# Patient Record
Sex: Female | Born: 1945
Health system: Southern US, Community
[De-identification: ages and names within clinical notes are randomized; demographics above are authoritative.]

## PROBLEM LIST (undated history)

## (undated) DIAGNOSIS — I10 Essential (primary) hypertension: Secondary | ICD-10-CM

## (undated) DIAGNOSIS — M1990 Unspecified osteoarthritis, unspecified site: Secondary | ICD-10-CM

## (undated) DIAGNOSIS — C801 Malignant (primary) neoplasm, unspecified: Secondary | ICD-10-CM

## (undated) DIAGNOSIS — N362 Urethral caruncle: Secondary | ICD-10-CM

## (undated) DIAGNOSIS — R7303 Prediabetes: Secondary | ICD-10-CM

## (undated) DIAGNOSIS — Z853 Personal history of malignant neoplasm of breast: Secondary | ICD-10-CM

## (undated) HISTORY — DX: Malignant (primary) neoplasm, unspecified: C80.1

## (undated) HISTORY — DX: Essential (primary) hypertension: I10

## (undated) HISTORY — PX: JOINT REPLACEMENT: SHX530

## (undated) HISTORY — PX: BREAST SURGERY: SHX581

---

## 1996-12-09 HISTORY — PX: VAGINAL HYSTERECTOMY: SUR661

## 1998-06-22 ENCOUNTER — Ambulatory Visit (HOSPITAL_COMMUNITY): Admission: RE | Admit: 1998-06-22 | Discharge: 1998-06-22 | Payer: Self-pay | Admitting: Obstetrics and Gynecology

## 2000-11-28 ENCOUNTER — Other Ambulatory Visit: Admission: RE | Admit: 2000-11-28 | Discharge: 2000-11-28 | Payer: Self-pay | Admitting: Obstetrics and Gynecology

## 2002-03-10 ENCOUNTER — Other Ambulatory Visit: Admission: RE | Admit: 2002-03-10 | Discharge: 2002-03-10 | Payer: Self-pay | Admitting: Obstetrics and Gynecology

## 2002-07-28 ENCOUNTER — Ambulatory Visit (HOSPITAL_COMMUNITY): Admission: RE | Admit: 2002-07-28 | Discharge: 2002-07-28 | Payer: Self-pay | Admitting: Obstetrics and Gynecology

## 2002-07-28 ENCOUNTER — Encounter: Payer: Self-pay | Admitting: Obstetrics and Gynecology

## 2003-10-11 ENCOUNTER — Ambulatory Visit (HOSPITAL_COMMUNITY): Admission: RE | Admit: 2003-10-11 | Discharge: 2003-10-11 | Payer: Self-pay | Admitting: Obstetrics and Gynecology

## 2003-12-21 ENCOUNTER — Emergency Department (HOSPITAL_COMMUNITY): Admission: EM | Admit: 2003-12-21 | Discharge: 2003-12-21 | Payer: Self-pay | Admitting: Emergency Medicine

## 2004-10-18 ENCOUNTER — Ambulatory Visit (HOSPITAL_COMMUNITY): Admission: RE | Admit: 2004-10-18 | Discharge: 2004-10-18 | Payer: Self-pay | Admitting: Obstetrics and Gynecology

## 2004-12-04 ENCOUNTER — Emergency Department (HOSPITAL_COMMUNITY): Admission: EM | Admit: 2004-12-04 | Discharge: 2004-12-04 | Payer: Self-pay | Admitting: Emergency Medicine

## 2005-08-27 ENCOUNTER — Ambulatory Visit: Payer: Self-pay | Admitting: Internal Medicine

## 2005-09-29 ENCOUNTER — Emergency Department (HOSPITAL_COMMUNITY): Admission: EM | Admit: 2005-09-29 | Discharge: 2005-09-29 | Payer: Self-pay | Admitting: Emergency Medicine

## 2006-08-15 ENCOUNTER — Ambulatory Visit (HOSPITAL_COMMUNITY): Admission: RE | Admit: 2006-08-15 | Discharge: 2006-08-15 | Payer: Self-pay | Admitting: Obstetrics and Gynecology

## 2006-12-09 HISTORY — PX: OTHER SURGICAL HISTORY: SHX169

## 2007-06-17 DIAGNOSIS — I10 Essential (primary) hypertension: Secondary | ICD-10-CM | POA: Insufficient documentation

## 2007-06-30 ENCOUNTER — Ambulatory Visit: Payer: Self-pay | Admitting: Internal Medicine

## 2007-07-09 ENCOUNTER — Ambulatory Visit: Payer: Self-pay | Admitting: Internal Medicine

## 2007-07-09 ENCOUNTER — Encounter: Payer: Self-pay | Admitting: Internal Medicine

## 2007-07-09 LAB — CONVERTED CEMR LAB
ALT: 14 units/L (ref 0–35)
AST: 17 units/L (ref 0–37)
Albumin: 3.9 g/dL (ref 3.5–5.2)
Alkaline Phosphatase: 74 units/L (ref 39–117)
BUN: 12 mg/dL (ref 6–23)
Basophils Absolute: 0.1 10*3/uL (ref 0.0–0.1)
Basophils Relative: 1.2 % — ABNORMAL HIGH (ref 0.0–1.0)
Bilirubin, Direct: 0.1 mg/dL (ref 0.0–0.3)
CO2: 32 meq/L (ref 19–32)
Calcium: 9.3 mg/dL (ref 8.4–10.5)
Chloride: 101 meq/L (ref 96–112)
Cholesterol: 186 mg/dL (ref 0–200)
Creatinine, Ser: 0.7 mg/dL (ref 0.4–1.2)
Eosinophils Absolute: 0.3 10*3/uL (ref 0.0–0.6)
Eosinophils Relative: 5.4 % — ABNORMAL HIGH (ref 0.0–5.0)
GFR calc Af Amer: 110 mL/min
GFR calc non Af Amer: 91 mL/min
Glucose, Bld: 118 mg/dL — ABNORMAL HIGH (ref 70–99)
HCT: 35.2 % — ABNORMAL LOW (ref 36.0–46.0)
HDL: 57.2 mg/dL (ref >39.0)
Hemoglobin: 12 g/dL (ref 12.0–15.0)
LDL Cholesterol: 119 mg/dL — ABNORMAL HIGH (ref 0–99)
Lymphocytes Relative: 39 % (ref 12.0–46.0)
MCHC: 34 g/dL (ref 30.0–36.0)
MCV: 81.8 fL (ref 78.0–100.0)
Monocytes Absolute: 0.4 10*3/uL (ref 0.2–0.7)
Monocytes Relative: 6.7 % (ref 3.0–11.0)
Neutro Abs: 2.9 10*3/uL (ref 1.4–7.7)
Neutrophils Relative %: 47.7 % (ref 43.0–77.0)
Phosphorus: 4.3 mg/dL (ref 2.3–4.6)
Platelets: 322 10*3/uL (ref 150–400)
Potassium: 3.8 meq/L (ref 3.5–5.1)
RBC: 4.31 M/uL (ref 3.87–5.11)
RDW: 13.4 % (ref 11.5–14.6)
Sodium: 138 meq/L (ref 135–145)
TSH: 1.39 microintl units/mL (ref 0.35–5.50)
Total Bilirubin: 0.6 mg/dL (ref 0.3–1.2)
Total CHOL/HDL Ratio: 3.3
Total Protein: 7.2 g/dL (ref 6.0–8.3)
Triglycerides: 48 mg/dL (ref 0–149)
VLDL: 10 mg/dL (ref 0–40)
WBC: 6.1 10*3/uL (ref 4.5–10.5)

## 2007-09-22 ENCOUNTER — Encounter: Payer: Self-pay | Admitting: Internal Medicine

## 2007-12-10 DIAGNOSIS — D0590 Unspecified type of carcinoma in situ of unspecified breast: Secondary | ICD-10-CM | POA: Insufficient documentation

## 2007-12-10 HISTORY — PX: MASTECTOMY: SHX3

## 2007-12-24 ENCOUNTER — Ambulatory Visit (HOSPITAL_COMMUNITY): Admission: RE | Admit: 2007-12-24 | Discharge: 2007-12-24 | Payer: Self-pay | Admitting: Obstetrics and Gynecology

## 2008-01-06 ENCOUNTER — Encounter: Admission: RE | Admit: 2008-01-06 | Discharge: 2008-01-06 | Payer: Self-pay | Admitting: Obstetrics and Gynecology

## 2008-01-06 ENCOUNTER — Encounter (INDEPENDENT_AMBULATORY_CARE_PROVIDER_SITE_OTHER): Payer: Self-pay | Admitting: Diagnostic Radiology

## 2008-01-21 ENCOUNTER — Encounter: Admission: RE | Admit: 2008-01-21 | Discharge: 2008-01-21 | Payer: Self-pay | Admitting: Obstetrics and Gynecology

## 2008-03-01 ENCOUNTER — Encounter (INDEPENDENT_AMBULATORY_CARE_PROVIDER_SITE_OTHER): Payer: Self-pay | Admitting: General Surgery

## 2008-03-01 ENCOUNTER — Inpatient Hospital Stay (HOSPITAL_COMMUNITY): Admission: RE | Admit: 2008-03-01 | Discharge: 2008-03-03 | Payer: Self-pay | Admitting: General Surgery

## 2008-03-01 HISTORY — PX: OTHER SURGICAL HISTORY: SHX169

## 2008-03-08 ENCOUNTER — Ambulatory Visit: Payer: Self-pay | Admitting: Oncology

## 2008-03-18 ENCOUNTER — Encounter: Payer: Self-pay | Admitting: Internal Medicine

## 2008-03-18 LAB — CBC WITH DIFFERENTIAL/PLATELET
BASO%: 0.3 % (ref 0.0–2.0)
EOS%: 3.7 % (ref 0.0–7.0)
LYMPH%: 38.2 % (ref 14.0–48.0)
MCH: 27 pg (ref 26.0–34.0)
MCHC: 33.5 g/dL (ref 32.0–36.0)
MCV: 80.7 fL — ABNORMAL LOW (ref 81.0–101.0)
MONO%: 5.6 % (ref 0.0–13.0)
Platelets: 390 10*3/uL (ref 145–400)
RBC: 4.33 10*6/uL (ref 3.70–5.32)
RDW: 14.2 % (ref 11.3–14.5)

## 2008-03-21 LAB — COMPREHENSIVE METABOLIC PANEL
ALT: 10 U/L (ref 0–35)
AST: 10 U/L (ref 0–37)
Alkaline Phosphatase: 90 U/L (ref 39–117)
Glucose, Bld: 129 mg/dL — ABNORMAL HIGH (ref 70–99)
Potassium: 3.7 mEq/L (ref 3.5–5.3)
Sodium: 143 mEq/L (ref 135–145)
Total Bilirubin: 0.3 mg/dL (ref 0.3–1.2)
Total Protein: 7.6 g/dL (ref 6.0–8.3)

## 2008-03-25 LAB — VITAMIN D 1,25 DIHYDROXY: Vit D, 1,25-Dihydroxy: 26 pg/mL (ref 15–75)

## 2008-04-04 ENCOUNTER — Encounter: Payer: Self-pay | Admitting: Internal Medicine

## 2008-05-26 ENCOUNTER — Ambulatory Visit: Payer: Self-pay | Admitting: Oncology

## 2008-05-30 ENCOUNTER — Encounter: Payer: Self-pay | Admitting: Internal Medicine

## 2008-05-30 LAB — CBC WITH DIFFERENTIAL/PLATELET
Basophils Absolute: 0 10*3/uL (ref 0.0–0.1)
Eosinophils Absolute: 0.3 10*3/uL (ref 0.0–0.5)
HCT: 35.7 % (ref 34.8–46.6)
HGB: 11.9 g/dL (ref 11.6–15.9)
LYMPH%: 39.1 % (ref 14.0–48.0)
MONO#: 0.4 10*3/uL (ref 0.1–0.9)
NEUT#: 3 10*3/uL (ref 1.5–6.5)
Platelets: 333 10*3/uL (ref 145–400)
RBC: 4.45 10*6/uL (ref 3.70–5.32)
WBC: 6 10*3/uL (ref 3.9–10.0)

## 2008-05-31 LAB — FERRITIN: Ferritin: 90 ng/mL (ref 10–291)

## 2008-07-01 ENCOUNTER — Ambulatory Visit: Payer: Self-pay | Admitting: Internal Medicine

## 2008-07-01 DIAGNOSIS — R209 Unspecified disturbances of skin sensation: Secondary | ICD-10-CM | POA: Insufficient documentation

## 2008-09-15 ENCOUNTER — Ambulatory Visit: Payer: Self-pay | Admitting: Internal Medicine

## 2008-10-13 ENCOUNTER — Ambulatory Visit: Payer: Self-pay | Admitting: Oncology

## 2008-10-17 ENCOUNTER — Encounter: Payer: Self-pay | Admitting: Internal Medicine

## 2008-10-17 LAB — CBC WITH DIFFERENTIAL/PLATELET
Basophils Absolute: 0 10*3/uL (ref 0.0–0.1)
EOS%: 3.1 % (ref 0.0–7.0)
Eosinophils Absolute: 0.2 10*3/uL (ref 0.0–0.5)
HGB: 11.4 g/dL — ABNORMAL LOW (ref 11.6–15.9)
LYMPH%: 37.2 % (ref 14.0–48.0)
MCH: 27.3 pg (ref 26.0–34.0)
MCV: 82.5 fL (ref 81.0–101.0)
MONO%: 5.1 % (ref 0.0–13.0)
NEUT#: 3.5 10*3/uL (ref 1.5–6.5)
NEUT%: 54.1 % (ref 39.6–76.8)
Platelets: 313 10*3/uL (ref 145–400)

## 2008-10-17 LAB — COMPREHENSIVE METABOLIC PANEL
BUN: 11 mg/dL (ref 6–23)
CO2: 29 mEq/L (ref 19–32)
Creatinine, Ser: 0.54 mg/dL (ref 0.40–1.20)
Glucose, Bld: 110 mg/dL — ABNORMAL HIGH (ref 70–99)
Total Bilirubin: 0.4 mg/dL (ref 0.3–1.2)
Total Protein: 6.5 g/dL (ref 6.0–8.3)

## 2008-12-07 ENCOUNTER — Encounter: Payer: Self-pay | Admitting: Internal Medicine

## 2009-01-10 ENCOUNTER — Encounter: Admission: RE | Admit: 2009-01-10 | Discharge: 2009-01-10 | Payer: Self-pay | Admitting: Oncology

## 2009-02-22 ENCOUNTER — Ambulatory Visit: Payer: Self-pay | Admitting: Oncology

## 2009-02-24 ENCOUNTER — Encounter: Payer: Self-pay | Admitting: Internal Medicine

## 2009-02-24 LAB — RESEARCH LABS

## 2009-08-23 ENCOUNTER — Ambulatory Visit: Payer: Self-pay | Admitting: Oncology

## 2009-10-10 ENCOUNTER — Ambulatory Visit: Payer: Self-pay | Admitting: Oncology

## 2009-10-12 ENCOUNTER — Encounter (INDEPENDENT_AMBULATORY_CARE_PROVIDER_SITE_OTHER): Payer: Self-pay | Admitting: *Deleted

## 2009-10-12 LAB — CBC WITH DIFFERENTIAL/PLATELET
BASO%: 0.9 % (ref 0.0–2.0)
EOS%: 2.4 % (ref 0.0–7.0)
HCT: 37.3 % (ref 34.8–46.6)
HGB: 11.7 g/dL (ref 11.6–15.9)
MCH: 26.8 pg (ref 25.1–34.0)
MCHC: 31.4 g/dL — ABNORMAL LOW (ref 31.5–36.0)
MONO#: 0.4 10*3/uL (ref 0.1–0.9)
RDW: 14 % (ref 11.2–14.5)
WBC: 5.9 10*3/uL (ref 3.9–10.3)
lymph#: 2.6 10*3/uL (ref 0.9–3.3)

## 2009-10-12 LAB — FERRITIN: Ferritin: 104 ng/mL (ref 10–291)

## 2010-01-23 ENCOUNTER — Encounter: Admission: RE | Admit: 2010-01-23 | Discharge: 2010-01-23 | Payer: Self-pay | Admitting: Oncology

## 2010-02-06 ENCOUNTER — Ambulatory Visit: Payer: Self-pay | Admitting: Oncology

## 2010-03-13 ENCOUNTER — Ambulatory Visit: Payer: Self-pay | Admitting: Oncology

## 2010-03-15 ENCOUNTER — Encounter: Payer: Self-pay | Admitting: Internal Medicine

## 2010-10-15 ENCOUNTER — Ambulatory Visit: Payer: Self-pay | Admitting: Internal Medicine

## 2010-12-18 ENCOUNTER — Ambulatory Visit: Payer: Self-pay | Admitting: Oncology

## 2010-12-30 ENCOUNTER — Encounter: Payer: Self-pay | Admitting: Oncology

## 2010-12-30 ENCOUNTER — Encounter: Payer: Self-pay | Admitting: Obstetrics and Gynecology

## 2011-01-08 ENCOUNTER — Other Ambulatory Visit (HOSPITAL_COMMUNITY): Payer: Self-pay | Admitting: Obstetrics and Gynecology

## 2011-01-08 DIAGNOSIS — Z139 Encounter for screening, unspecified: Secondary | ICD-10-CM

## 2011-01-08 DIAGNOSIS — Z1231 Encounter for screening mammogram for malignant neoplasm of breast: Secondary | ICD-10-CM

## 2011-01-10 NOTE — Letter (Signed)
[facility]   Imported By: Maryln Gottron 04/05/2010 10:05:58  _____________________________________________________________________  External Attachment:    Type:   Image     Comment:   External Document

## 2011-01-10 NOTE — Assessment & Plan Note (Signed)
Summary: consult re: leg and knee swelling/cjr   Vital Signs:  Patient profile:   65 year old female Weight:      150 pounds Temp:     98.6 degrees F oral BP sitting:   118 / 70  (right arm) Cuff size:   regular  Vitals Entered By: Duard Brady LPN (October 15, 2010 1:02 PM) CC: c/o (L) leg and knee pain    ***declined flu vaccine Is Patient Diabetic? No   CC:  c/o (L) leg and knee pain    ***declined flu vaccine.  History of Present Illness: 65 year old patient has a history of hypertension.  She is followed closely by oncology.  Status post left mastectomy for breast cancer.  For the past several months.  She has had some nonspecific discomfort and numbness involving her left leg.  This has been present for after 6 months.  Initially been nonprogressive.  She did see orthopedics and had negative x-rays.  Preventive Screening-Counseling & Management  Alcohol-Tobacco     Smoking Status: never  Allergies (verified): No Known Drug Allergies  Past History:  Past Medical History: Hypertension fibrocystic breast disease history of vitamin D deficiency breast cancer high-grade DCIS left breast, history of negative sentinel node biopsy April 2009      tamoxifen started 04-04-08  Past Surgical History: Hysterectomy patient also had bilateral salpingo-oophorectomy due to endometriosis Tubal ligation status post partial left mastectomy for high-grade DCIS with negative sentinel node biopsy colonoscopy 12-09   Social History: Former Smoker Widow/Widower-lost husband, Fayrene Fearing,  to cancer, December 2010 Smoking Status:  never  Review of Systems       The patient complains of difficulty walking.  The patient denies anorexia, fever, weight loss, weight gain, vision loss, decreased hearing, hoarseness, chest pain, syncope, dyspnea on exertion, peripheral edema, prolonged cough, headaches, hemoptysis, abdominal pain, melena, hematochezia, severe indigestion/heartburn, hematuria,  incontinence, genital sores, muscle weakness, suspicious skin lesions, depression, unusual weight change, abnormal bleeding, enlarged lymph nodes, angioedema, and breast masses.    Physical Exam  General:  Well-developed,well-nourished,in no acute distress; alert,appropriate and cooperative throughout examination Head:  Normocephalic and atraumatic without obvious abnormalities. No apparent alopecia or balding. Eyes:  No corneal or conjunctival inflammation noted. EOMI. Perrla. Funduscopic exam benign, without hemorrhages, exudates or papilledema. Vision grossly normal. Mouth:  Oral mucosa and oropharynx without lesions or exudates.  Teeth in good repair. Neck:  No deformities, masses, or tenderness noted. Chest Wall:  No deformities, masses, or tenderness noted. Lungs:  Normal respiratory effort, chest expands symmetrically. Lungs are clear to auscultation, no crackles or wheezes. Heart:  Normal rate and regular rhythm. S1 and S2 normal without gallop, murmur, click, rub or other extra sounds. Abdomen:  Bowel sounds positive,abdomen soft and non-tender without masses, organomegaly or hernias noted. Msk:  No deformity or scoliosis noted of thoracic or lumbar spine.   Pulses:  R and L carotid,radial,femoral,dorsalis pedis and posterior tibial pulses are full and equal bilaterally Extremities:  No clubbing, cyanosis, edema, or deformity noted with normal full range of motion of all joints.     Impression & Recommendations:  Problem # 1:  PARESTHESIA (ICD-782.0) clinical exam of the left leg is unremarkable.  Will try Advil and clinically observed.  She does work 12 hour days on her feet with [employer]  Problem # 2:  HYPERTENSION (ICD-401.9)  Her updated medication list for this problem includes:    Hydrochlorothiazide 25 Mg Tabs (Hydrochlorothiazide) .Marland Kitchen... 1 once daily  Her updated medication list for this problem includes:    Hydrochlorothiazide 25 Mg Tabs (Hydrochlorothiazide) .Marland Kitchen... 1  once daily  Complete Medication List: 1)  Hydrochlorothiazide 25 Mg Tabs (Hydrochlorothiazide) .Marland Kitchen.. 1 once daily 2)  Tamoxifen Citrate 20 Mg Tabs (Tamoxifen citrate) .Marland Kitchen.. 1 once daily  Patient Instructions: 1)  Please schedule a follow-up appointment in 1 year. 2)  Limit your Sodium (Salt). 3)  It is important that you exercise regularly at least 20 minutes 5 times a week. If you develop chest pain, have severe difficulty breathing, or feel very tired , stop exercising immediately and seek medical attention. 4)  Take calcium +Vitamin D daily. 5)  Check your Blood Pressure regularly. If it is above: 150/90  you should make an appointment. Prescriptions: HYDROCHLOROTHIAZIDE 25 MG TABS (HYDROCHLOROTHIAZIDE) 1 once daily  #90 x 6   Entered and Authorized by:   Gordy Savers  MD   Signed by:   Gordy Savers  MD on 10/15/2010   Method used:   Electronically to        [facility] (retail)       [address]       [place], [place]  [address]       Ph: [phone]       Fax: [phone]   RxID:   [id number]    Orders Added: 1)  Est. Patient Level III [401[address]]

## 2011-01-25 ENCOUNTER — Other Ambulatory Visit: Payer: Self-pay | Admitting: Oncology

## 2011-01-25 ENCOUNTER — Ambulatory Visit
Admission: RE | Admit: 2011-01-25 | Discharge: 2011-01-25 | Disposition: A | Discharge status: Home / Self Care | Payer: BC Managed Care – PPO | Source: Ambulatory Visit | Attending: Oncology | Admitting: Oncology

## 2011-01-25 ENCOUNTER — Ambulatory Visit (HOSPITAL_COMMUNITY): Admission: RE | Admit: 2011-01-25 | Payer: BC Managed Care – PPO | Source: Ambulatory Visit

## 2011-01-25 DIAGNOSIS — Z139 Encounter for screening, unspecified: Secondary | ICD-10-CM

## 2011-02-04 ENCOUNTER — Encounter (HOSPITAL_BASED_OUTPATIENT_CLINIC_OR_DEPARTMENT_OTHER): Payer: BC Managed Care – PPO | Admitting: Oncology

## 2011-02-04 DIAGNOSIS — Z170 Estrogen receptor positive status [ER+]: Secondary | ICD-10-CM

## 2011-02-04 DIAGNOSIS — D0590 Unspecified type of carcinoma in situ of unspecified breast: Secondary | ICD-10-CM

## 2011-04-23 NOTE — Op Note (Signed)
NAMEEARLIE, ARCIGA NO.:  [id number]   MEDICAL RECORD NO.:  [id number]          PATIENT TYPE:  OIB   LOCATION:  5128                         FACILITY:  [facility]   PHYSICIAN:  Adolph Pollack, M.D.DATE OF BIRTH:  05-Dec-1946   DATE OF PROCEDURE:  03/01/2008  DATE OF DISCHARGE:                               OPERATIVE REPORT   PREOPERATIVE DIAGNOSIS:  Diffuse ductal carcinoma in situ, left breast.   POSTOPERATIVE DIAGNOSIS:  Diffuse ductal carcinoma in situ, left breast.   PROCEDURE:  1. Lymphatic mapping and injection of methylene blue dye.  2. Left axillary sentinel lymph node biopsy.  3. Left total mastectomy.  4. Excision of biopsy site left chest wall.   SURGEON:  Adolph Pollack, M.D.   ASSISTANT:  Lennie Muckle, M.D.   ANESTHESIA:  General.   INDICATIONS:  This is a 65 year old female who had an abnormal left  mammogram.  She underwent more imaging and biopsies which demonstrated  high grade ductal carcinoma in situ.  The area measured about 8 cm on  MRI.  She now presents for the above procedures.   TECHNIQUE:  She had the successful injection of the radioactive material  in the holding area and the left breast was marked with my initials.  She was then brought to the operating room, placed supine on the  operating table, and a general anesthetic was administered.  The left  chest wall, mid sternal and sternal area, and axilla were sterilely  prepped and draped.  Prior to sterile prep and drape, I had injected  blue dye in four quadrants and massaged the breast for five minutes.  I  also used the NeoProbe to locate an area relatively high in the axilla  with increased counts.  I made a small incision in the left axilla and  carried it down through the subcutaneous tissue to the axillary area.  I  then identified a blue hot node and excised this.  I still had increased  counts and found another area where the counts were increased but the  node  was not blue and I excised this.  I sent these off for touch prep  and there were four nodes total, all negative on touch prep.  Hemostasis  was controlled with cautery and I closed the subcutaneous tissue with  interrupted 3-0 Vicryl sutures and placed a moist sponge in the dermal  area.   Following this, I then made an elliptical incision to include the nipple  and the left breast through the skin.  I then raised skin flaps to the  clavicle superiorly, to the sternum medially, to the anterior rectus  sheath inferiorly, and to the latissimus dorsi muscle laterally using  electrocautery.  The breast was then dissected free from the pectoralis  fascia and the medial aspect marked with a suture and sent for  pathology.  I controlled bleeding with electrocautery.  Following this,  I irrigated out the wound and hemostasis was adequate.  A stab incision  was made in the lower lateral flap area and a 19 Blake drain placed into  the wound and anchored to the skin with 3-0 nylon suture.  I then  excised a biopsy site which was quite medial in the upper inner quadrant  and well away from potential incision and sent this separately to  pathology.  I closed the subcutaneous tissue with 3-0 Vicryl.  I then  approximated the subcutaneous tissue of the mastectomy site with  interpret 3-0 Vicryl sutures.  The biopsy site wound and the larger  chest wall skin incisions were then closed with running 3-0 Monocryl  suture.  The axillary incision was closed with running 4-0 Monocryl  suture.  Steri-Strips and sterile dressings were applied and the drain  was placed to closed suction.  She tolerated the procedure well without  any apparent complications and was taken to the recovery room in  satisfactory condition.      Adolph Pollack, M.D.  Electronically Signed     TJR/MEDQ  D:  03/01/2008  T:  03/01/2008  Job:  [id number]   cc:   Daryl Eastern, M.D.  Malachi Pro. Ambrose Mantle, M.D.  Consuello Bossier., M.D.

## 2011-04-23 NOTE — Discharge Summary (Signed)
NAMEAMBRI, MILTNER NO.:  [id number]   MEDICAL RECORD NO.:  [id number]          PATIENT TYPE:  INP   LOCATION:  5128                         FACILITY:  [facility]   PHYSICIAN:  Adolph Pollack, M.D.DATE OF BIRTH:  Jun 12, 1946   DATE OF ADMISSION:  03/01/2008  DATE OF DISCHARGE:  03/03/2008                               DISCHARGE SUMMARY   PRINCIPAL DISCHARGE DIAGNOSIS:  Invasive left breast cancer.   SECONDARY DIAGNOSES:  1. Postoperative nausea and vomiting.  2. Hypertension.   PROCEDURE:  Left mastectomy and sentinel lymph node biopsy.   REASON FOR ADMISSION:  This is a 65 year old female who was recently  diagnosed with left breast cancer with fairly diffuse involvement of a  part of her breast.  She was admitted for the above procedure.   HOSPITAL COURSE:  She underwent the above procedure, which she tolerated  well, except for having some significant postop nausea and vomiting.  She had serous-type drain output.  It took a couple days for the nausea  to resolve, be able to tolerate a diet.  By second postoperative day,  she felt much better.  She learnt how to take care of her drains and she  is able to be discharged.   DISPOSITION:  Discharged to home on March 03, 2008 in satisfactory  condition.  She will come to see me in about to 4-5 days for possible  drain removal.  She is given specific discharge instructions and pain  medication.      Adolph Pollack, M.D.  Electronically Signed     TJR/MEDQ  D:  04/11/2008  T:  04/12/2008  Job:  [id number]

## 2011-06-14 ENCOUNTER — Encounter (HOSPITAL_BASED_OUTPATIENT_CLINIC_OR_DEPARTMENT_OTHER): Payer: BC Managed Care – PPO | Admitting: Oncology

## 2011-06-14 DIAGNOSIS — D0590 Unspecified type of carcinoma in situ of unspecified breast: Secondary | ICD-10-CM

## 2011-06-14 DIAGNOSIS — Z170 Estrogen receptor positive status [ER+]: Secondary | ICD-10-CM

## 2011-07-05 ENCOUNTER — Encounter (INDEPENDENT_AMBULATORY_CARE_PROVIDER_SITE_OTHER): Payer: Self-pay

## 2011-07-08 ENCOUNTER — Encounter (INDEPENDENT_AMBULATORY_CARE_PROVIDER_SITE_OTHER): Payer: Self-pay | Admitting: General Surgery

## 2011-08-06 ENCOUNTER — Encounter (INDEPENDENT_AMBULATORY_CARE_PROVIDER_SITE_OTHER): Payer: Self-pay | Admitting: General Surgery

## 2011-08-06 ENCOUNTER — Ambulatory Visit (INDEPENDENT_AMBULATORY_CARE_PROVIDER_SITE_OTHER): Payer: BC Managed Care – PPO | Admitting: General Surgery

## 2011-08-06 VITALS — BP 118/82 | HR 60 | Temp 96.3°F | Ht 64.0 in | Wt 143.5 lb

## 2011-08-06 DIAGNOSIS — C50912 Malignant neoplasm of unspecified site of left female breast: Secondary | ICD-10-CM | POA: Insufficient documentation

## 2011-08-06 DIAGNOSIS — C50919 Malignant neoplasm of unspecified site of unspecified female breast: Secondary | ICD-10-CM

## 2011-08-06 NOTE — Progress Notes (Signed)
Operation:  Left  Mastectomy and sentinel lymph node biopsy  Date:03/03/08  Stage:T1N0  Hormone receptor status:positive  HPI:  Yvonne Barron is being followed by Dr. Mancel Bale for her left breast cancer. She is on tamoxifen. He felt an area of fullness at the right inframammary fold, the medial aspect, that he wanted me to evaluate. She has not felt any masses in her right breast.  No left chest wall nodules. She denies any large lymph nodes in the axillary neck.  Right mammogram from February 2012 is a BI-RADS one.  PE: Gen.-she looks well and in no acute distress.  Right breast-no nipple discharge, no suspicious skin changes, no dominant masses; area of firmness at the medial aspect of the inframammary fold with no discrete mass.  Left chest-well-healed scar, no palpable nodules.   Nodes-no axillary, supraclavicular, or cervical adenopathy.  Assessment-breast cancer status post left mastectomy and sentinel lymph node biopsy with no clinical evidence of recurrence. Dominant area medially right inframammary region. No discrete mass here.  Plan: Do not think further diagnostic studies need to be done of this area. Return visit in 2 months for repeat examination.

## 2011-09-02 LAB — DIFFERENTIAL
Basophils Absolute: 0.1
Basophils Relative: 1
Eosinophils Absolute: 0.3
Monocytes Relative: 7
Neutro Abs: 3.6
Neutrophils Relative %: 45

## 2011-09-02 LAB — COMPREHENSIVE METABOLIC PANEL
ALT: 15
Alkaline Phosphatase: 97
BUN: 12
CO2: 33 — ABNORMAL HIGH
Chloride: 99
GFR calc non Af Amer: >60
Glucose, Bld: 92
Potassium: 3.7
Sodium: 138
Total Bilirubin: 0.5

## 2011-09-02 LAB — CBC
HCT: 37.7
Hemoglobin: 12.4
RBC: 4.58
RDW: 14.3
WBC: 7.9

## 2011-09-02 LAB — PROTIME-INR: INR: 0.9

## 2011-09-27 ENCOUNTER — Encounter (INDEPENDENT_AMBULATORY_CARE_PROVIDER_SITE_OTHER): Payer: Self-pay

## 2011-10-01 ENCOUNTER — Ambulatory Visit (INDEPENDENT_AMBULATORY_CARE_PROVIDER_SITE_OTHER): Payer: BC Managed Care – PPO | Admitting: General Surgery

## 2011-10-01 ENCOUNTER — Encounter (INDEPENDENT_AMBULATORY_CARE_PROVIDER_SITE_OTHER): Payer: Self-pay | Admitting: General Surgery

## 2011-10-01 VITALS — BP 128/88 | HR 60 | Temp 96.5°F | Resp 18 | Ht 64.0 in | Wt 142.5 lb

## 2011-10-01 DIAGNOSIS — C50919 Malignant neoplasm of unspecified site of unspecified female breast: Secondary | ICD-10-CM

## 2011-10-01 NOTE — Progress Notes (Signed)
Operation:  Left  Mastectomy and sentinel lymph node biopsy  Date:03/03/08  Stage:T1N0  Hormone receptor status:positive  HPI:  Yvonne Barron returns for followup of a firm area in the right inframammary fold at the medial aspect.  She has not noticed any changes since her last visit.  PE: Gen.-she looks well and in no acute distress.  Right breast-no nipple discharge, no suspicious skin changes, no dominant masses; area of firmness at the medial aspect of the inframammary fold is unchanged.    Assessment-breast cancer status post left mastectomy and sentinel lymph node biopsy with no clinical evidence of recurrence. Firm area in right inframammary area that is unchanged and is not an obvious mass.  Plan: Do not think further diagnostic studies need to be done of this area. Return visit in 3 months for repeat examination.

## 2011-10-22 ENCOUNTER — Other Ambulatory Visit: Payer: Self-pay | Admitting: *Deleted

## 2011-10-22 DIAGNOSIS — C50219 Malignant neoplasm of upper-inner quadrant of unspecified female breast: Secondary | ICD-10-CM

## 2011-10-22 MED ORDER — TAMOXIFEN CITRATE 20 MG PO TABS: 20.0000 mg | ORAL_TABLET | Freq: Every day | ORAL | Status: DC

## 2011-11-20 ENCOUNTER — Telehealth: Payer: Self-pay | Admitting: Oncology

## 2011-11-20 NOTE — Telephone Encounter (Signed)
l/m w/ appt in for pt/aom per pof from 06/14/11

## 2011-12-19 ENCOUNTER — Ambulatory Visit: Payer: BC Managed Care – PPO | Admitting: Oncology

## 2012-01-01 ENCOUNTER — Other Ambulatory Visit: Payer: Self-pay | Admitting: Obstetrics and Gynecology

## 2012-01-01 ENCOUNTER — Other Ambulatory Visit (HOSPITAL_COMMUNITY): Payer: Self-pay | Admitting: Obstetrics and Gynecology

## 2012-01-01 DIAGNOSIS — Z9012 Acquired absence of left breast and nipple: Secondary | ICD-10-CM

## 2012-01-01 DIAGNOSIS — Z1231 Encounter for screening mammogram for malignant neoplasm of breast: Secondary | ICD-10-CM

## 2012-01-16 ENCOUNTER — Telehealth: Payer: Self-pay | Admitting: Oncology

## 2012-01-16 ENCOUNTER — Ambulatory Visit: Payer: BC Managed Care – PPO | Admitting: Oncology

## 2012-01-16 NOTE — Telephone Encounter (Signed)
pt called and cancelled appt for today will rtn call to r/s

## 2012-01-16 NOTE — Telephone Encounter (Signed)
called pts back and r/s appt to 03/07

## 2012-01-20 ENCOUNTER — Encounter (INDEPENDENT_AMBULATORY_CARE_PROVIDER_SITE_OTHER): Payer: Self-pay | Admitting: General Surgery

## 2012-01-20 ENCOUNTER — Ambulatory Visit (INDEPENDENT_AMBULATORY_CARE_PROVIDER_SITE_OTHER): Payer: BC Managed Care – PPO | Admitting: General Surgery

## 2012-01-20 VITALS — BP 110/78 | HR 66 | Temp 98.5°F | Resp 18 | Ht 64.0 in | Wt 143.4 lb

## 2012-01-20 DIAGNOSIS — Z853 Personal history of malignant neoplasm of breast: Secondary | ICD-10-CM

## 2012-01-20 NOTE — Progress Notes (Signed)
Operation:  Left  Mastectomy and sentinel lymph node biopsy  Date:03/03/08  Stage:T1N0  Hormone receptor status:positive  HPI:  Ms. Negrette returns for followup of a firm area in the right inframammary fold at the medial aspect.  She has not noticed any changes since her last visit.  No chest wall nodules on the left side.  No adenopathy.  PE: Gen.-she looks well and in no acute distress.  Right breast-no nipple discharge, no suspicious skin changes, no dominant masses; area of firmness at the medial aspect of the inframammary fold remains unchanged.  Left chest-well healed scar, no palpable nodules.  Nodes-no axillary, cervical or supraclavicular adenopathy    Assessment-Left breast cancer status post left mastectomy and sentinel lymph node biopsy with no clinical evidence of recurrence. Firm area in right inframammary area that is unchanged and is not an obvious mass.  Plan: Do not think further diagnostic studies need to be done of this area. Return visit prn.

## 2012-01-20 NOTE — Patient Instructions (Signed)
Call if you feel any masses on your chest wall or in your breast.

## 2012-01-29 ENCOUNTER — Ambulatory Visit: Payer: BC Managed Care – PPO

## 2012-02-12 ENCOUNTER — Ambulatory Visit
Admission: RE | Admit: 2012-02-12 | Discharge: 2012-02-12 | Disposition: A | Discharge status: Home / Self Care | Payer: BC Managed Care – PPO | Source: Ambulatory Visit | Attending: Obstetrics and Gynecology | Admitting: Obstetrics and Gynecology

## 2012-02-12 DIAGNOSIS — Z1231 Encounter for screening mammogram for malignant neoplasm of breast: Secondary | ICD-10-CM

## 2012-02-12 DIAGNOSIS — Z9012 Acquired absence of left breast and nipple: Secondary | ICD-10-CM

## 2012-02-13 ENCOUNTER — Other Ambulatory Visit: Payer: Self-pay | Admitting: *Deleted

## 2012-02-13 ENCOUNTER — Ambulatory Visit (HOSPITAL_BASED_OUTPATIENT_CLINIC_OR_DEPARTMENT_OTHER): Payer: BC Managed Care – PPO | Admitting: Nurse Practitioner

## 2012-02-13 ENCOUNTER — Telehealth: Payer: Self-pay | Admitting: Oncology

## 2012-02-13 VITALS — BP 116/71 | HR 70 | Temp 97.6°F | Ht 64.0 in | Wt 145.2 lb

## 2012-02-13 DIAGNOSIS — L989 Disorder of the skin and subcutaneous tissue, unspecified: Secondary | ICD-10-CM

## 2012-02-13 DIAGNOSIS — C50912 Malignant neoplasm of unspecified site of left female breast: Secondary | ICD-10-CM

## 2012-02-13 DIAGNOSIS — C50219 Malignant neoplasm of upper-inner quadrant of unspecified female breast: Secondary | ICD-10-CM

## 2012-02-13 DIAGNOSIS — I10 Essential (primary) hypertension: Secondary | ICD-10-CM

## 2012-02-13 DIAGNOSIS — D0590 Unspecified type of carcinoma in situ of unspecified breast: Secondary | ICD-10-CM

## 2012-02-13 DIAGNOSIS — Z9010 Acquired absence of unspecified breast and nipple: Secondary | ICD-10-CM

## 2012-02-13 MED ORDER — TAMOXIFEN CITRATE 20 MG PO TABS: 20.0000 mg | ORAL_TABLET | Freq: Every day | ORAL | Status: AC

## 2012-02-13 NOTE — Telephone Encounter (Signed)
appts made and printed for pt  °

## 2012-02-13 NOTE — Progress Notes (Signed)
OFFICE PROGRESS NOTE  Interval history:  Yvonne Barron returns as scheduled. She has no complaints. She continues tamoxifen. She denies hot flashes. No leg swelling. She intermittently has aching leg pain. She attributes the leg pain to long hours at work. She denies vaginal bleeding. No changes over the left chest wall. No changes at the right breast. She reports a skin lesion at the right lower leg has increased in size.   Objective: Blood pressure 116/71, pulse 70, temperature 97.6 F (36.4 C), temperature source Oral, height 5\' 4"  (1.626 m), weight 145 lb 3.2 oz (65.862 kg).  Oropharynx is without thrush or ulceration. No palpable cervical, supraclavicular or axillary lymph nodes. Status post left mastectomy. No evidence of chest wall tumor recurrence. No palpable mass in the right breast. Persistent one to 2 cm firm oblong area at the lower medial right breast. Lungs are clear. Regular cardiac rhythm. Abdomen soft and nontender. Extremities without edema. Calves soft and nontender. At the right mid tibial region there is a hyperpigmented skin lesion measuring approximately 1 cm. The border is irregular. The lesion is significantly darker in one area. The lesion is minimally raised.  Lab Results: Lab Results  Component Value Date   WBC 5.9 10/12/2009   HGB 11.7 10/12/2009   HCT 37.3 10/12/2009   MCV 85.4 10/12/2009   PLT 272 10/12/2009    Chemistry:    Chemistry      Component Value Date/Time   NA 142 10/17/2008 0912   K 3.6 10/17/2008 0912   CL 108 10/17/2008 0912   CO2 29 10/17/2008 0912   BUN 11 10/17/2008 0912   CREATININE 0.54 10/17/2008 0912      Component Value Date/Time   CALCIUM 9.0 10/17/2008 0912   ALKPHOS 49 10/17/2008 0912   AST 14 10/17/2008 0912   ALT 14 10/17/2008 0912   BILITOT 0.4 10/17/2008 0912       Studies/Results: Mm Digital Screening Unilat R  02/13/2012  DG SCREENING MAMMOGRAM RIGHT CC and MLO view(s) were taken of the right breast.  DIGITAL SCREENING MAMMOGRAM  WITH CAD: The breast tissue is heterogeneously dense.  No masses or malignant type calcifications are  identified.  Compared with prior studies.  Images were processed with CAD.  IMPRESSION: No specific mammographic evidence of malignancy.  Next screening mammogram is recommended in one  year.  A result letter of this screening mammogram will be mailed directly to the patient.  ASSESSMENT: Negative - BI-RADS 1  Screening mammogram in 1 year. ,    Medications: I have reviewed the patient's current medications.  Assessment/Plan:  1. High-grade ductal carcinoma in situ of the left breast, status post left mastectomy and negative sentinel lymph node biopsy.  She began adjuvant tamoxifen 04/04/2008.  The plan is to continue tamoxifen for 5 years.  The tamoxifen dose was increased to 20 mg twice daily after she was found to be an intermediate metabolizer on the [facility] pharmacogenomics study.  She is now taking tamoxifen at a dose of 20 mg daily.  Mammogram 02/13/2012 was negative. 2. Hypertension. 3. History of red cell microcytosis with a borderline low hemoglobin 10/17/2008.  The MCV, hemoglobin, and ferritin were normal on 10/12/2009. 4. History of a low vitamin D level:  She takes calcium and vitamin D. 5. Prominent "ridge" of breast tissue at the lower inner right breast, persistent. Likely a benign finding. 6. Irregular skin lesion at the right lower leg that has recently increased in size. She has seen Dr. Terri Piedra  in the past. She will contact Dr. Terri Piedra to schedule an appointment.  Disposition-Yvonne Barron continues tamoxifen. She will return for an office visit in 6 months. She will contact the office in the interim with any problems.  Lonna Cobb ANP/GNP-BC

## 2012-06-24 ENCOUNTER — Other Ambulatory Visit: Payer: Self-pay | Admitting: Urology

## 2012-07-06 ENCOUNTER — Other Ambulatory Visit (HOSPITAL_COMMUNITY): Payer: Self-pay | Admitting: Obstetrics and Gynecology

## 2012-07-06 ENCOUNTER — Ambulatory Visit (HOSPITAL_COMMUNITY)
Admission: RE | Admit: 2012-07-06 | Discharge: 2012-07-06 | Disposition: A | Discharge status: Home / Self Care | Payer: BC Managed Care – PPO | Source: Ambulatory Visit | Attending: Obstetrics and Gynecology | Admitting: Obstetrics and Gynecology

## 2012-07-06 DIAGNOSIS — N949 Unspecified condition associated with female genital organs and menstrual cycle: Secondary | ICD-10-CM | POA: Insufficient documentation

## 2012-07-06 DIAGNOSIS — M949 Disorder of cartilage, unspecified: Secondary | ICD-10-CM | POA: Insufficient documentation

## 2012-07-06 DIAGNOSIS — R52 Pain, unspecified: Secondary | ICD-10-CM

## 2012-07-06 DIAGNOSIS — Z853 Personal history of malignant neoplasm of breast: Secondary | ICD-10-CM | POA: Insufficient documentation

## 2012-07-06 DIAGNOSIS — M899 Disorder of bone, unspecified: Secondary | ICD-10-CM | POA: Insufficient documentation

## 2012-07-08 ENCOUNTER — Encounter (HOSPITAL_BASED_OUTPATIENT_CLINIC_OR_DEPARTMENT_OTHER): Payer: Self-pay | Admitting: *Deleted

## 2012-07-08 NOTE — Progress Notes (Signed)
NPO AFTER MN. ARRIVES AT 0915. NEEDS ISTAT AND EKG.

## 2012-07-09 NOTE — H&P (Signed)
History of Present Illness         Yvonne Barron is a 66 year old female who noted a "growth" in the area of her vagina and was seen and evaluated by Dr. Ambrose Mantle who noted a urethral caruncle. She was seen for this in 8/12 and at that time had noted some slight pink discoloration after wiping but was having no discomfort. She had not noted any changes in her voiding pattern. At that time we discussed the treatment options however because she was asymptomatic I did not recommend any form of treatment. Topical therapy with an estrogen was contraindicated because of her history of breast cancer.  Iinterval history: She is concerned because she feels the area around the urethra has increased in size slightly. She still will occasionally see some slight pink tinge when she wipes but is not having any definite gross bleeding. It's not causing any discomfort with urination but she says she does feel it occasionally throbbing type sensation it is very mild in the area of the urethra. She is more concerned about the appearance of this area. She wanted reevaluation and to discuss the options.   Past Medical History Problems  1. History of  Breast Cancer V10.3 2. History of  Hypertension 401.9 3. History of  Murmurs 785.2  Surgical History Problems  1. History of  Breast Surgery Mastectomy V45.71 2. History of  Hysterectomy V45.77  Current Meds 1. Hydrochlorothiazide 25 MG Oral Tablet; Therapy: (Recorded:22Aug2012) to 2. Tamoxifen Citrate 20 MG Oral Tablet; Therapy: (Recorded:22Aug2012) to 3. Vitamin C TABS; Therapy: (Recorded:22Aug2012) to  Allergies Medication  1. No Known Drug Allergies  Family History Problems  1. Paternal history of  Death In The Family Father 66yrs 2. Paternal history of  Diabetes Mellitus V18.0 3. Maternal history of  Diabetes Mellitus V18.0 4. Fraternal history of  Diabetes Mellitus V18.0 5. Maternal history of  Family Health Status - Mother's Age 61yrs 6. Family  history of  Family Health Status Number Of Children 2 sons 7. Paternal history of  Renal Failure  Social History Problems  1. Caffeine Use 2 qd 2. Marital History - Widowed 3. Occupation: Sr. Horticulturist, commercial 4. Tobacco Use 305.1 1 ppd for 60yrs, stopped 73yrs ago Denied  5. History of  Alcohol Use  Review of Systems Genitourinary, constitutional, skin, eye, otolaryngeal, hematologic/lymphatic, cardiovascular, pulmonary, endocrine, musculoskeletal, gastrointestinal, neurological and psychiatric system(s) were reviewed and pertinent findings if present are noted.  Genitourinary: urinary frequency and dysuria.  Constitutional: night sweats.  Respiratory: cough.    Vitals Vital Signs BMI Calculated: 25.1 BSA Calculated: 1.72 Height: 5 ft 4 in Weight: 147 lb  Blood Pressure: 136 / 87 Temperature: 98.5 F Heart Rate: 71  Physical Exam Constitutional: Well nourished and well developed . No acute distress.  ENT:. The ears and nose are normal in appearance.  Neck: The appearance of the neck is normal and no neck mass is present.  Pulmonary: No respiratory distress and normal respiratory rhythm and effort.  Cardiovascular: Heart rate and rhythm are normal . No peripheral edema.  Abdomen: The abdomen is soft and nontender. No masses are palpated. No CVA tenderness. No hernias are palpable. No hepatosplenomegaly noted.  Genitourinary: Examination of the external genitalia shows normal female external genitalia and no lesions. The urethra is not tender. There is no urethral mass. There does appear to be urethral prolapse. Vaginal exam demonstrates no abnormalities. The cervix is is absent. The uterus is absent. The bladder is non tender and not  distended. The anus is normal on inspection. The perineum is normal on inspection.  Lymphatics: The femoral and inguinal nodes are not enlarged or tender.  Skin: Normal skin turgor, no visible rash and no visible skin lesions.    Neuro/Psych:. Mood and affect are appropriate.   Assessment Assessed  1. Urethral Caruncle 599.3   Examination today reveals her urethral caruncle remains present and might just be slightly worse although certainly is not severe by any means. That being said it's mild enough that I would typically recommend estrogen topically however because she had estrogen receptor positive breast cancer I am not going to recommend that. We therefore discussed the options which would be continued observation as it is very mild versus surgical excision. I went over the procedure with her in detail including its risks and complications.  Plan: She will undergo excision of urethral caruncle.

## 2012-07-10 ENCOUNTER — Ambulatory Visit (HOSPITAL_BASED_OUTPATIENT_CLINIC_OR_DEPARTMENT_OTHER)
Admission: RE | Admit: 2012-07-10 | Discharge: 2012-07-10 | Disposition: A | Discharge status: Home / Self Care | Payer: BC Managed Care – PPO | Source: Ambulatory Visit | Attending: Urology | Admitting: Urology

## 2012-07-10 ENCOUNTER — Other Ambulatory Visit: Payer: Self-pay

## 2012-07-10 ENCOUNTER — Encounter (HOSPITAL_BASED_OUTPATIENT_CLINIC_OR_DEPARTMENT_OTHER): Payer: Self-pay | Admitting: Anesthesiology

## 2012-07-10 ENCOUNTER — Encounter (HOSPITAL_BASED_OUTPATIENT_CLINIC_OR_DEPARTMENT_OTHER): Payer: Self-pay | Admitting: *Deleted

## 2012-07-10 ENCOUNTER — Encounter (HOSPITAL_BASED_OUTPATIENT_CLINIC_OR_DEPARTMENT_OTHER)
Admission: RE | Disposition: A | Discharge status: Home / Self Care | Payer: Self-pay | Source: Ambulatory Visit | Attending: Urology

## 2012-07-10 ENCOUNTER — Ambulatory Visit (HOSPITAL_BASED_OUTPATIENT_CLINIC_OR_DEPARTMENT_OTHER): Payer: BC Managed Care – PPO | Admitting: Anesthesiology

## 2012-07-10 DIAGNOSIS — N362 Urethral caruncle: Secondary | ICD-10-CM | POA: Diagnosis present

## 2012-07-10 DIAGNOSIS — Z853 Personal history of malignant neoplasm of breast: Secondary | ICD-10-CM | POA: Insufficient documentation

## 2012-07-10 DIAGNOSIS — N368 Other specified disorders of urethra: Secondary | ICD-10-CM | POA: Insufficient documentation

## 2012-07-10 DIAGNOSIS — Z90710 Acquired absence of both cervix and uterus: Secondary | ICD-10-CM | POA: Insufficient documentation

## 2012-07-10 DIAGNOSIS — I10 Essential (primary) hypertension: Secondary | ICD-10-CM | POA: Insufficient documentation

## 2012-07-10 HISTORY — PX: CYSTOSCOPY WITH URETHRAL CARUNCLE: SHX5124

## 2012-07-10 HISTORY — DX: Personal history of malignant neoplasm of breast: Z85.3

## 2012-07-10 HISTORY — DX: Urethral caruncle: N36.2

## 2012-07-10 LAB — POCT I-STAT 4, (NA,K, GLUC, HGB,HCT)
Glucose, Bld: 96 mg/dL (ref 70–99)
HCT: 36 % (ref 36.0–46.0)
Hemoglobin: 12.2 g/dL (ref 12.0–15.0)
Potassium: 3.6 mEq/L (ref 3.5–5.1)
Sodium: 142 mEq/L (ref 135–145)

## 2012-07-10 SURGERY — CYSTOSCOPY, WITH URETHRAL CARUNCLE EXCISION
Anesthesia: General | Site: Urethra | Wound class: Clean Contaminated

## 2012-07-10 MED ORDER — MIDAZOLAM HCL 5 MG/5ML IJ SOLN
INTRAMUSCULAR | Status: DC | PRN
  Administered 2012-07-10: 2 mg via INTRAVENOUS

## 2012-07-10 MED ORDER — CEFAZOLIN SODIUM-DEXTROSE 2-3 GM-% IV SOLR: 2.0000 g | INTRAVENOUS | Status: DC

## 2012-07-10 MED ORDER — DEXAMETHASONE SODIUM PHOSPHATE 4 MG/ML IJ SOLN
INTRAMUSCULAR | Status: DC | PRN
  Administered 2012-07-10: 10 mg via INTRAVENOUS

## 2012-07-10 MED ORDER — LIDOCAINE HCL (CARDIAC) 20 MG/ML IV SOLN
INTRAVENOUS | Status: DC | PRN
  Administered 2012-07-10: 80 mg via INTRAVENOUS

## 2012-07-10 MED ORDER — MEPERIDINE HCL 25 MG/ML IJ SOLN: 6.2500 mg | INTRAMUSCULAR | Status: DC | PRN

## 2012-07-10 MED ORDER — LACTATED RINGERS IV SOLN
INTRAVENOUS | Status: DC | PRN
  Administered 2012-07-10: 10:00:00 via INTRAVENOUS

## 2012-07-10 MED ORDER — CEFAZOLIN SODIUM 1-5 GM-% IV SOLN: 1.0000 g | INTRAVENOUS | Status: DC

## 2012-07-10 MED ORDER — STERILE WATER FOR IRRIGATION IR SOLN
Status: DC | PRN
  Administered 2012-07-10: 3000 mL

## 2012-07-10 MED ORDER — PROPOFOL 10 MG/ML IV EMUL
INTRAVENOUS | Status: DC | PRN
  Administered 2012-07-10: 200 mg via INTRAVENOUS

## 2012-07-10 MED ORDER — KETOROLAC TROMETHAMINE 30 MG/ML IJ SOLN
INTRAMUSCULAR | Status: DC | PRN
  Administered 2012-07-10: 30 mg via INTRAVENOUS

## 2012-07-10 MED ORDER — FENTANYL CITRATE 0.05 MG/ML IJ SOLN
INTRAMUSCULAR | Status: DC | PRN
  Administered 2012-07-10: 50 ug via INTRAVENOUS
  Administered 2012-07-10: 100 ug via INTRAVENOUS
  Administered 2012-07-10: 50 ug via INTRAVENOUS

## 2012-07-10 MED ORDER — HYDROCODONE-ACETAMINOPHEN 10-325 MG PO TABS: 1.0000 | ORAL_TABLET | ORAL | Status: AC | PRN

## 2012-07-10 MED ORDER — GLYCOPYRROLATE 0.2 MG/ML IJ SOLN
INTRAMUSCULAR | Status: DC | PRN
  Administered 2012-07-10: 0.2 mg via INTRAVENOUS

## 2012-07-10 MED ORDER — PHENAZOPYRIDINE HCL 200 MG PO TABS: 200.0000 mg | ORAL_TABLET | Freq: Three times a day (TID) | ORAL | Status: AC | PRN

## 2012-07-10 MED ORDER — PROMETHAZINE HCL 25 MG/ML IJ SOLN
6.2500 mg | INTRAMUSCULAR | Status: DC | PRN
  Administered 2012-07-10: 6.25 mg via INTRAVENOUS

## 2012-07-10 MED ORDER — LACTATED RINGERS IV SOLN: INTRAVENOUS | Status: DC

## 2012-07-10 MED ORDER — FENTANYL CITRATE 0.05 MG/ML IJ SOLN: 25.0000 ug | INTRAMUSCULAR | Status: DC | PRN

## 2012-07-10 MED ORDER — CEFAZOLIN SODIUM 1-5 GM-% IV SOLN
INTRAVENOUS | Status: DC | PRN
  Administered 2012-07-10: 2 g via INTRAVENOUS

## 2012-07-10 MED ORDER — LACTATED RINGERS IV SOLN
INTRAVENOUS | Status: DC
  Administered 2012-07-10 (×2): via INTRAVENOUS

## 2012-07-10 MED ORDER — ONDANSETRON HCL 4 MG/2ML IJ SOLN
INTRAMUSCULAR | Status: DC | PRN
  Administered 2012-07-10: 4 mg via INTRAVENOUS

## 2012-07-10 SURGICAL SUPPLY — 54 items
BAG URINE DRAINAGE (UROLOGICAL SUPPLIES) IMPLANT
BAG URINE LEG 19OZ MD ST LTX (BAG) IMPLANT
BLADE SURG 15 STRL LF DISP TIS (BLADE) ×1 IMPLANT
BLADE SURG 15 STRL SS (BLADE) ×1
BLADE SURG ROTATE 9660 (MISCELLANEOUS) ×2 IMPLANT
CANISTER SUCTION 2500CC (MISCELLANEOUS) ×4 IMPLANT
CATH FOLEY 2WAY SLVR  5CC 16FR (CATHETERS) ×1
CATH FOLEY 2WAY SLVR 5CC 16FR (CATHETERS) ×1 IMPLANT
CATH FOLEY 5CC 28FR (CATHETERS) IMPLANT
CLOTH BEACON ORANGE TIMEOUT ST (SAFETY) ×2 IMPLANT
COVER MAYO STAND STRL (DRAPES) ×2 IMPLANT
COVER TABLE BACK 60X90 (DRAPES) ×2 IMPLANT
DRAPE LG THREE QUARTER DISP (DRAPES) IMPLANT
DRAPE UNDERBUTTOCKS STRL (DRAPE) ×2 IMPLANT
ELECT REM PT RETURN 9FT ADLT (ELECTROSURGICAL) ×2
ELECTRODE REM PT RTRN 9FT ADLT (ELECTROSURGICAL) ×1 IMPLANT
GAUZE PACKING IODOFORM 2 (PACKING) IMPLANT
GLOVE BIO SURGEON STRL SZ 6.5 (GLOVE) ×2 IMPLANT
GLOVE BIO SURGEON STRL SZ8 (GLOVE) ×2 IMPLANT
GLOVE ECLIPSE 6.0 STRL STRAW (GLOVE) ×2 IMPLANT
GOWN PREVENTION PLUS LG XLONG (DISPOSABLE) ×2 IMPLANT
GOWN STRL REIN XL XLG (GOWN DISPOSABLE) ×2 IMPLANT
IV NS IRRIG 3000ML ARTHROMATIC (IV SOLUTION) IMPLANT
NEEDLE HYPO 22GX1.5 SAFETY (NEEDLE) ×2 IMPLANT
NS IRRIG 500ML POUR BTL (IV SOLUTION) IMPLANT
PACK BASIN DAY SURGERY FS (CUSTOM PROCEDURE TRAY) ×2 IMPLANT
PACK CYSTOSCOPY (CUSTOM PROCEDURE TRAY) IMPLANT
PENCIL BUTTON HOLSTER BLD 10FT (ELECTRODE) ×2 IMPLANT
PLUG CATH AND CAP STER (CATHETERS) ×2 IMPLANT
RETRACTOR LONRSTAR 16.6X16.6CM (MISCELLANEOUS) ×1 IMPLANT
RETRACTOR STAY HOOK 5MM (MISCELLANEOUS) ×4 IMPLANT
RETRACTOR STER APS 16.6X16.6CM (MISCELLANEOUS) ×2
SET IRRIG Y TYPE TUR BLADDER L (SET/KITS/TRAYS/PACK) ×2 IMPLANT
SHEET LAVH (DRAPES) ×2 IMPLANT
SUCTION FRAZIER TIP 10 FR DISP (SUCTIONS) ×2 IMPLANT
SUT CHROMIC 4 0 RB 1X27 (SUTURE) ×6 IMPLANT
SUT ETHIBOND 0 (SUTURE) IMPLANT
SUT VIC AB 2-0 SH 27 (SUTURE)
SUT VIC AB 2-0 SH 27XBRD (SUTURE) IMPLANT
SUT VIC AB 2-0 UR6 27 (SUTURE) IMPLANT
SUT VIC AB 3-0 SH 27 (SUTURE)
SUT VIC AB 3-0 SH 27X BRD (SUTURE) IMPLANT
SUT VICRYL 3 0 UR 6 27 (SUTURE) IMPLANT
SWAB CULTURE LIQ STUART DBL (MISCELLANEOUS) IMPLANT
SYR BULB IRRIGATION 50ML (SYRINGE) ×2 IMPLANT
SYR CONTROL 10ML LL (SYRINGE) ×2 IMPLANT
SYRINGE 10CC LL (SYRINGE) ×2 IMPLANT
TRAY DSU PREP LF (CUSTOM PROCEDURE TRAY) ×2 IMPLANT
TUBE ANAEROBIC SPECIMEN COL (MISCELLANEOUS) IMPLANT
TUBE CONNECTING 12X1/4 (SUCTIONS) ×2 IMPLANT
TUBING SUCTION BULK 100 FT (MISCELLANEOUS) ×4 IMPLANT
WATER STERILE IRR 3000ML UROMA (IV SOLUTION) IMPLANT
WATER STERILE IRR 500ML POUR (IV SOLUTION) ×2 IMPLANT
YANKAUER SUCT BULB TIP NO VENT (SUCTIONS) IMPLANT

## 2012-07-10 NOTE — Transfer of Care (Signed)
Immediate Anesthesia Transfer of Care Note  Patient: Yvonne Barron  Procedure(s) Performed: Procedure(s) (LRB): CYSTOSCOPY WITH URETHRAL CARUNCLE (N/A)  Patient Location: PACU  Anesthesia Type: General  Level of Consciousness: awake, sedated, patient cooperative and responds to stimulation  Airway & Oxygen Therapy: Patient Spontanous Breathing and Patient connected to face mask oxygen  Post-op Assessment: Report given to PACU RN, Post -op Vital signs reviewed and stable and Patient moving all extremities  Post vital signs: Reviewed and stable  Complications: No apparent anesthesia complications

## 2012-07-10 NOTE — Interval H&P Note (Signed)
History and Physical Interval Note:  07/10/2012 10:16 AM  Yvonne Barron  has presented today for surgery, with the diagnosis of Urethral Caruncle  The various methods of treatment have been discussed with the patient and family. After consideration of risks, benefits and other options for treatment, the patient has consented to  Procedure(s) (LRB): CYSTOSCOPY WITH URETHRAL CARUNCLE (N/A) as a surgical intervention .  The patient's history has been reviewed, patient examined, no change in status, stable for surgery.  I have reviewed the patient's chart and labs.  Questions were answered to the patient's satisfaction.     [place]

## 2012-07-10 NOTE — Op Note (Signed)
PATIENT:  Yvonne Barron  PRE-OPERATIVE DIAGNOSIS:  Urethral prolapse  POST-OPERATIVE DIAGNOSIS:  Same  PROCEDURE:  Procedure(s): 1. Cystoscopy  2. Excision of prolapsed urethra  SURGEON:  [place]  INDICATION: Mrs. Yvonne Barron is a 66 year old female who first noted what she described as a "growth" in the area of the urethra in 8/12. He was not associated with any change in her voiding pattern. Over time it increased in size slightly and occasionally she would note a small amount of blood when she wipes. She also complains of occasional throbbing-type sensations in his scrotum is mild in the area of the urethra. Estrogen therapy was not a good form of therapy for this patient due to the past history of estrogen receptor positive breast cancer. We therefore discussed treatment options and she is elected to proceed with surgical excision.  ANESTHESIA:  General  EBL:  Less than 50 cc  DRAINS: 14 French Foley catheter  LOCAL MEDICATIONS USED:  None  SPECIMEN:  Prolapsed urethra  DISPOSITION OF SPECIMEN:  PATHOLOGY  Description of procedure: After informed consent the patient was taken to the major or, placed on the table and administered general anesthesia. She was then moved to the dorsal lithotomy position and her genitalia was sterilely prepped and draped. An official timeout was then performed.  A 22 French rigid cystoscope was passed under direct vision through the urethral meatus and I noted no significant abnormality of the actual urethra. The bladder was entered and fully and systematically inspected. It was noted be free of any tumor stones or inflammatory lesions. Ureteral orifices were noted to be of normal configuration and position.  A 16 French Foley catheter was placed in the bladder. I then began by excising using a scalpel the prolapsed urethra flush with the introitus. I started at the 12:00 position and cut down to the catheter. I then placed a 4-0 chromic through the  mucosa as a holding suture. I then began to cut the prolapsed urethra away from the introitus flush with the introitus in a clockwise direction. I placed holding stitches in the urethral mucosa at the 2:00, 4:00, 6:00, 8:00 and 10:00 positions. Once the redundant tissue was completely excised I then reapproximated the urethral mucosa to the introital mucosa and then removed the 16 French Foley catheter. There was no bleeding noted. I then replaced that catheter with a 14 French Foley catheter and this was connected to closed system drainage. The patient was then awakened and taken to recovery room in stable and satisfactory condition. She tolerated the procedure well no intraoperative complications. Needle sponge and instrument counts were correct at the end of the operation.  I left a Foley catheter indwelling and it can be removed at the time of her followup appointment in one week.  PLAN OF CARE: Discharge to home after PACU  PATIENT DISPOSITION:  PACU - hemodynamically stable.

## 2012-07-10 NOTE — Anesthesia Preprocedure Evaluation (Signed)
Anesthesia Evaluation  Patient identified by MRN, date of birth, ID band Patient awake    Reviewed: Allergy & Precautions, H&P , NPO status , Patient's Chart, lab work & pertinent test results  Airway Mallampati: I TM Distance: >3 FB Neck ROM: Full    Dental No notable dental hx. (+) Partial Upper and Partial Lower   Pulmonary neg pulmonary ROS,  breath sounds clear to auscultation  Pulmonary exam normal       Cardiovascular hypertension, Pt. on medications negative cardio ROS  Rhythm:Regular Rate:Normal     Neuro/Psych negative neurological ROS  negative psych ROS   GI/Hepatic negative GI ROS, Neg liver ROS,   Endo/Other  negative endocrine ROS  Renal/GU negative Renal ROS  negative genitourinary   Musculoskeletal negative musculoskeletal ROS (+)   Abdominal   Peds negative pediatric ROS (+)  Hematology negative hematology ROS (+)   Anesthesia Other Findings   Reproductive/Obstetrics negative OB ROS                           Anesthesia Physical Anesthesia Plan  ASA: II  Anesthesia Plan: General   Post-op Pain Management:    Induction: Intravenous  Airway Management Planned: LMA  Additional Equipment:   Intra-op Plan:   Post-operative Plan: Extubation in OR  Informed Consent: I have reviewed the patients History and Physical, chart, labs and discussed the procedure including the risks, benefits and alternatives for the proposed anesthesia with the patient or authorized representative who has indicated his/her understanding and acceptance.   Dental advisory given  Plan Discussed with: CRNA  Anesthesia Plan Comments:         Anesthesia Quick Evaluation

## 2012-07-10 NOTE — Anesthesia Postprocedure Evaluation (Signed)
  Anesthesia Post-op Note  Patient: Yvonne Barron  Procedure(s) Performed: Procedure(s) (LRB): CYSTOSCOPY WITH URETHRAL CARUNCLE (N/A)  Patient Location: PACU  Anesthesia Type: General  Level of Consciousness: awake and alert   Airway and Oxygen Therapy: Patient Spontanous Breathing  Post-op Pain: mild  Post-op Assessment: Post-op Vital signs reviewed, Patient's Cardiovascular Status Stable, Respiratory Function Stable, Patent Airway and No signs of Nausea or vomiting  Post-op Vital Signs: stable  Complications: No apparent anesthesia complications

## 2012-07-10 NOTE — Anesthesia Procedure Notes (Addendum)
Procedure Name: LMA Insertion Date/Time: 07/10/2012 10:37 AM Performed by: Jessica Priest Pre-anesthesia Checklist: Patient identified, Emergency Drugs available, Suction available and Patient being monitored Patient Re-evaluated:Patient Re-evaluated prior to inductionOxygen Delivery Method: Circle System Utilized Preoxygenation: Pre-oxygenation with 100% oxygen Intubation Type: IV induction Ventilation: Mask ventilation without difficulty LMA: LMA inserted LMA Size: 4.0 Number of attempts: 1 Airway Equipment and Method: bite block Placement Confirmation: positive ETCO2 Tube secured with: Tape Dental Injury: Teeth and Oropharynx as per pre-operative assessment

## 2012-07-13 ENCOUNTER — Encounter (HOSPITAL_BASED_OUTPATIENT_CLINIC_OR_DEPARTMENT_OTHER): Payer: Self-pay | Admitting: Urology

## 2012-07-16 ENCOUNTER — Encounter (HOSPITAL_BASED_OUTPATIENT_CLINIC_OR_DEPARTMENT_OTHER): Payer: Self-pay

## 2012-08-07 ENCOUNTER — Other Ambulatory Visit: Payer: Self-pay | Admitting: *Deleted

## 2012-08-07 ENCOUNTER — Telehealth: Payer: Self-pay | Admitting: Oncology

## 2012-08-07 NOTE — Telephone Encounter (Signed)
Per 8/30 pof r/s 9/5 appt 2-3 wks out call day. lmonvm for pt on both home/cell re change w/new d/t for 9/24.

## 2012-08-13 ENCOUNTER — Telehealth: Payer: Self-pay | Admitting: *Deleted

## 2012-08-13 ENCOUNTER — Other Ambulatory Visit: Payer: BC Managed Care – PPO | Admitting: Lab

## 2012-08-13 ENCOUNTER — Ambulatory Visit: Payer: BC Managed Care – PPO | Admitting: Oncology

## 2012-08-13 NOTE — Telephone Encounter (Signed)
Patient confirmed over the phone the date and time on 09-01-2012 at 12:00pm

## 2012-09-01 ENCOUNTER — Ambulatory Visit (HOSPITAL_BASED_OUTPATIENT_CLINIC_OR_DEPARTMENT_OTHER): Payer: BC Managed Care – PPO | Admitting: Oncology

## 2012-09-01 ENCOUNTER — Telehealth: Payer: Self-pay | Admitting: Oncology

## 2012-09-01 ENCOUNTER — Other Ambulatory Visit (HOSPITAL_BASED_OUTPATIENT_CLINIC_OR_DEPARTMENT_OTHER): Payer: BC Managed Care – PPO | Admitting: Lab

## 2012-09-01 VITALS — BP 121/65 | HR 74 | Temp 98.0°F | Resp 20 | Ht 64.0 in | Wt 140.1 lb

## 2012-09-01 DIAGNOSIS — C50912 Malignant neoplasm of unspecified site of left female breast: Secondary | ICD-10-CM

## 2012-09-01 DIAGNOSIS — E559 Vitamin D deficiency, unspecified: Secondary | ICD-10-CM

## 2012-09-01 DIAGNOSIS — D649 Anemia, unspecified: Secondary | ICD-10-CM

## 2012-09-01 DIAGNOSIS — I10 Essential (primary) hypertension: Secondary | ICD-10-CM

## 2012-09-01 DIAGNOSIS — C50919 Malignant neoplasm of unspecified site of unspecified female breast: Secondary | ICD-10-CM

## 2012-09-01 DIAGNOSIS — D0590 Unspecified type of carcinoma in situ of unspecified breast: Secondary | ICD-10-CM

## 2012-09-01 LAB — CBC WITH DIFFERENTIAL/PLATELET
Basophils Absolute: 0.1 10*3/uL (ref 0.0–0.1)
EOS%: 4.3 % (ref 0.0–7.0)
HCT: 33.2 % — ABNORMAL LOW (ref 34.8–46.6)
HGB: 10.6 g/dL — ABNORMAL LOW (ref 11.6–15.9)
MCH: 26.6 pg (ref 25.1–34.0)
MONO#: 0.6 10*3/uL (ref 0.1–0.9)
NEUT#: 3.4 10*3/uL (ref 1.5–6.5)
RDW: 14.3 % (ref 11.2–14.5)
WBC: 7.9 10*3/uL (ref 3.9–10.3)
lymph#: 3.5 10*3/uL — ABNORMAL HIGH (ref 0.9–3.3)

## 2012-09-01 NOTE — Telephone Encounter (Signed)
Gave pt appt for December 2013 lab only and march 2014 lab and MD

## 2012-09-01 NOTE — Progress Notes (Signed)
   [facility]    OFFICE PROGRESS NOTE   INTERVAL HISTORY:   She returns as scheduled. She feels well. She continues tamoxifen. No hot flashes. No bleeding.  A urethral caruncle was removed by Dr. Vernie Ammons in August. She reports there was some bleeding following this procedure. No bleeding at present.  No change over either breast. She reports the right leg skin lesion was evaluated by Dr. Terri Piedra and a biopsy was not recommended.  Objective:  Vital signs in last 24 hours:  Blood pressure 121/65, pulse 74, temperature 98 F (36.7 C), temperature source Oral, resp. rate 20, height 5\' 4"  (1.626 m), weight 140 lb 1.6 oz (63.549 kg).    HEENT: Neck without mass Lymphatics: No cervical, supraclavicular, or axillary nodes Resp: Lungs clear bilaterally Cardio: Regular rate and rhythm GI: No hepatosplenomegaly Vascular: No leg edema Breasts: Status post left mastectomy, no evidence for chest wall tumor recurrence. Right breast without mass    Lab Results:  Lab Results  Component Value Date   WBC 7.9 09/01/2012   HGB 10.6* 09/01/2012   HCT 33.2* 09/01/2012   MCV 83.2 09/01/2012   PLT 286 09/01/2012   ANC 3.4 Absolute lymphocyte count 3.5  Medications: I have reviewed the patient's current medications.  Assessment/Plan: 1. High-grade ductal carcinoma in situ of the left breast, status post left mastectomy and negative sentinel lymph node biopsy. She began adjuvant tamoxifen 04/04/2008. The plan is to continue tamoxifen for 5 years. The tamoxifen dose was increased to 20 mg twice daily after she was found to be an intermediate metabolizer on the [facility] pharmacogenomics study. She is now taking tamoxifen at a dose of 20 mg daily. Mammogram 02/13/2012 was negative. 2. Hypertension. 3. History of red cell microcytosis with a borderline low hemoglobin 10/17/2008. The MCV, hemoglobin, and ferritin were normal on 10/12/2009. She has mild anemia today-? Related to the recent  surgical procedure 4. History of a low vitamin D level: She takes calcium and vitamin D.   Disposition:  She remains in clinical remission from breast cancer. She will continue tamoxifen. We will discuss the indication for continuing tamoxifen which she returns in 6 months.  The anemia could be related to blood loss from the recent surgery. She will return for a CBC in 3 months. We will investigate the anemia further if the hemoglobin is lower. Thornton Papas, MD  09/01/2012  12:54 PM

## 2012-11-24 ENCOUNTER — Other Ambulatory Visit (HOSPITAL_BASED_OUTPATIENT_CLINIC_OR_DEPARTMENT_OTHER): Payer: BC Managed Care – PPO | Admitting: Lab

## 2012-11-24 DIAGNOSIS — D649 Anemia, unspecified: Secondary | ICD-10-CM

## 2012-11-24 LAB — CBC WITH DIFFERENTIAL/PLATELET
Basophils Absolute: 0.1 10*3/uL (ref 0.0–0.1)
Eosinophils Absolute: 0.3 10*3/uL (ref 0.0–0.5)
HGB: 11.3 g/dL — ABNORMAL LOW (ref 11.6–15.9)
LYMPH%: 41 % (ref 14.0–49.7)
MONO#: 0.7 10*3/uL (ref 0.1–0.9)
NEUT#: 3.2 10*3/uL (ref 1.5–6.5)
Platelets: 285 10*3/uL (ref 145–400)
RBC: 4.19 10*6/uL (ref 3.70–5.45)
RDW: 15.2 % — ABNORMAL HIGH (ref 11.2–14.5)
WBC: 7.3 10*3/uL (ref 3.9–10.3)

## 2012-11-25 ENCOUNTER — Telehealth: Payer: Self-pay | Admitting: *Deleted

## 2012-11-25 NOTE — Telephone Encounter (Signed)
Message copied by Wandalee Ferdinand on Wed Nov 25, 2012 12:35 PM ------      Message from: Ladene Artist      Created: Tue Nov 24, 2012  9:41 PM       Please call patient, hb is better , f/u as scheduled

## 2012-11-25 NOTE — Telephone Encounter (Signed)
Left VM for patient to call office for lab results.

## 2012-11-26 ENCOUNTER — Telehealth: Payer: Self-pay | Admitting: *Deleted

## 2012-11-26 NOTE — Telephone Encounter (Signed)
Called and spoke with pt; per Dr. Truett Perna hgb stable and f/u as scheduled.  Pt verbalized understanding and confirmed appt for 03/01/13.

## 2012-12-11 ENCOUNTER — Other Ambulatory Visit: Payer: Self-pay | Admitting: Adult Health

## 2012-12-11 ENCOUNTER — Ambulatory Visit
Admission: RE | Admit: 2012-12-11 | Discharge: 2012-12-11 | Disposition: A | Discharge status: Home / Self Care | Payer: BC Managed Care – PPO | Source: Ambulatory Visit | Attending: Adult Health | Admitting: Adult Health

## 2012-12-11 DIAGNOSIS — R52 Pain, unspecified: Secondary | ICD-10-CM

## 2013-01-18 ENCOUNTER — Other Ambulatory Visit (INDEPENDENT_AMBULATORY_CARE_PROVIDER_SITE_OTHER): Payer: Self-pay | Admitting: General Surgery

## 2013-01-18 ENCOUNTER — Ambulatory Visit (INDEPENDENT_AMBULATORY_CARE_PROVIDER_SITE_OTHER): Payer: BC Managed Care – PPO | Admitting: General Surgery

## 2013-01-18 VITALS — BP 122/70 | HR 72 | Temp 96.2°F | Resp 20 | Ht 64.0 in | Wt 139.6 lb

## 2013-01-18 DIAGNOSIS — Z853 Personal history of malignant neoplasm of breast: Secondary | ICD-10-CM

## 2013-01-18 NOTE — Progress Notes (Signed)
Operation:  Left  Mastectomy and sentinel lymph node biopsy  Date:03/03/08  Stage:T1N0  Hormone receptor status:positive  HPI:  Yvonne Barron returns for followup of her left breast cancer s/p left mastectomy and SLN bx 02/2008.  She has been taking Tamoxifen.  No chest wall nodules on the left side.  No right breast masses.  No adenopathy.  PE: Gen.-she looks well and in no acute distress.  Right breast-no nipple discharge, no suspicious skin changes, no dominant masses  Left chest-well healed scar, no palpable nodules.  Nodes-no axillary, cervical or supraclavicular adenopathy    Assessment-Left breast cancer status post left mastectomy and sentinel lymph node biopsy with no clinical evidence of recurrence. It will be 5 years from her operation .  Plan:  She will need an annual followup visit and this can be done by Dr. Truett Perna or myself. I will let him decide if he would like to continue to follow her or if he would like me to do so.

## 2013-01-18 NOTE — Patient Instructions (Signed)
Call if you feel any lump in the left chest wall or right breast.

## 2013-01-20 ENCOUNTER — Ambulatory Visit (INDEPENDENT_AMBULATORY_CARE_PROVIDER_SITE_OTHER): Payer: BC Managed Care – PPO | Admitting: General Surgery

## 2013-02-11 ENCOUNTER — Ambulatory Visit
Admission: RE | Admit: 2013-02-11 | Discharge: 2013-02-11 | Disposition: A | Discharge status: Home / Self Care | Payer: BC Managed Care – PPO | Source: Ambulatory Visit | Attending: General Surgery | Admitting: General Surgery

## 2013-02-11 DIAGNOSIS — Z853 Personal history of malignant neoplasm of breast: Secondary | ICD-10-CM

## 2013-02-23 ENCOUNTER — Other Ambulatory Visit: Payer: Self-pay | Admitting: *Deleted

## 2013-02-23 DIAGNOSIS — C50919 Malignant neoplasm of unspecified site of unspecified female breast: Secondary | ICD-10-CM

## 2013-02-23 MED ORDER — TAMOXIFEN CITRATE 20 MG PO TABS: 20.0000 mg | ORAL_TABLET | Freq: Every day | ORAL | Status: DC

## 2013-03-01 ENCOUNTER — Other Ambulatory Visit (HOSPITAL_BASED_OUTPATIENT_CLINIC_OR_DEPARTMENT_OTHER): Payer: BC Managed Care – PPO | Admitting: Lab

## 2013-03-01 ENCOUNTER — Telehealth: Payer: Self-pay | Admitting: Oncology

## 2013-03-01 ENCOUNTER — Ambulatory Visit (HOSPITAL_BASED_OUTPATIENT_CLINIC_OR_DEPARTMENT_OTHER): Payer: BC Managed Care – PPO | Admitting: Nurse Practitioner

## 2013-03-01 VITALS — BP 138/80 | HR 69 | Temp 98.8°F | Resp 18 | Ht 64.0 in | Wt 136.7 lb

## 2013-03-01 DIAGNOSIS — M25519 Pain in unspecified shoulder: Secondary | ICD-10-CM

## 2013-03-01 DIAGNOSIS — D0590 Unspecified type of carcinoma in situ of unspecified breast: Secondary | ICD-10-CM

## 2013-03-01 DIAGNOSIS — D72829 Elevated white blood cell count, unspecified: Secondary | ICD-10-CM

## 2013-03-01 DIAGNOSIS — E559 Vitamin D deficiency, unspecified: Secondary | ICD-10-CM

## 2013-03-01 DIAGNOSIS — C50912 Malignant neoplasm of unspecified site of left female breast: Secondary | ICD-10-CM

## 2013-03-01 DIAGNOSIS — D649 Anemia, unspecified: Secondary | ICD-10-CM

## 2013-03-01 LAB — CBC WITH DIFFERENTIAL/PLATELET
Eosinophils Absolute: 0 10*3/uL (ref 0.0–0.5)
HCT: 34.9 % (ref 34.8–46.6)
LYMPH%: 28.2 % (ref 14.0–49.7)
MONO#: 0.8 10*3/uL (ref 0.1–0.9)
NEUT#: 6.9 10*3/uL — ABNORMAL HIGH (ref 1.5–6.5)
NEUT%: 63.9 % (ref 38.4–76.8)
Platelets: 328 10*3/uL (ref 145–400)
WBC: 10.8 10*3/uL — ABNORMAL HIGH (ref 3.9–10.3)

## 2013-03-01 NOTE — Progress Notes (Signed)
OFFICE PROGRESS NOTE  Interval history:  Yvonne Barron returns as scheduled. She feels well. No change over the left chest wall. She has bilateral knee pain and recently underwent cortisone injections. No new areas of pain. She has a good appetite. She has rare hot flashes. No vaginal bleeding. She completed a course of valacyclovir for shingles at the left lower back in January of this year.   Objective: Blood pressure 138/80, pulse 69, temperature 98.8 F (37.1 C), temperature source Oral, resp. rate 18, height 5\' 4"  (1.626 m), weight 136 lb 11.2 oz (62.007 kg).  Oropharynx is without thrush or ulceration. No palpable cervical, supraclavicular or axillary lymph nodes. Status post left mastectomy. No evidence of chest wall recurrence. Right breast without mass. Lungs are clear. Regular cardiac rhythm. Abdomen soft and nontender. No organomegaly. Extremities are without edema. Calves soft and nontender.  Lab Results: Lab Results  Component Value Date   WBC 10.8* 03/01/2013   HGB 11.2* 03/01/2013   HCT 34.9 03/01/2013   MCV 82.3 03/01/2013   PLT 328 03/01/2013    Chemistry:    Chemistry      Component Value Date/Time   NA 142 07/10/2012 1024   K 3.6 07/10/2012 1024   CL 108 10/17/2008 0912   CO2 29 10/17/2008 0912   BUN 11 10/17/2008 0912   CREATININE 0.54 10/17/2008 0912      Component Value Date/Time   CALCIUM 9.0 10/17/2008 0912   ALKPHOS 49 10/17/2008 0912   AST 14 10/17/2008 0912   ALT 14 10/17/2008 0912   BILITOT 0.4 10/17/2008 0912       Studies/Results: Mm Digital Screening Unilat R  02/15/2013  *RADIOLOGY REPORT*  Clinical Data: Screening.  MAMMOGRAPHIC UNILATERAL RIGHT DIGITAL SCREENING WITH CAD  Comparison:  Previous exams.  FINDINGS:  ACR Breast Density Category 3: The breast tissue is heterogeneously dense.  No suspicious masses, architectural distortion, or calcifications are present.  Images were processed with CAD.  IMPRESSION: No mammographic evidence of malignancy.  A result  letter of this screening mammogram will be mailed directly to the patient.  RECOMMENDATION: Screening mammogram in one year. (Code:SM-B-01Y)  BI-RADS CATEGORY 1:  Negative.   Original Report Authenticated By: Sherian Rein, M.D.     Medications: I have reviewed the patient's current medications.  Assessment/Plan:  1. High-grade ductal carcinoma in situ of the left breast, status post left mastectomy and negative sentinel lymph node biopsy. She began adjuvant tamoxifen 04/04/2008. The plan is to continue tamoxifen for 5 years. The tamoxifen dose was increased to 20 mg twice daily after she was found to be an intermediate metabolizer on the [facility] pharmacogenomics study. She is now taking tamoxifen at a dose of 20 mg daily. Mammogram 02/15/2013 was negative. 2. Hypertension. 3. History of red cell microcytosis with a borderline low hemoglobin 10/17/2008. The MCV, hemoglobin, and ferritin were normal on 10/12/2009. She continues to have red cell microcytosis with a mildly decreased hemoglobin. 4. History of a low vitamin D level: She takes calcium and vitamin D. 5. "Shingles" January 2014. She completed a course of valacyclovir. 6. Mildly elevated white count likely secondary to the recent cortisone injections.  Disposition-[name] appears stable. She remains in clinical remission from breast cancer. She will continue tamoxifen for now. Dr. Truett Perna will discuss the indication for continuing tamoxifen in the setting of DCIS for greater than 5 years with his colleagues. We will contact her with a final recommendation. She will return for a followup visit in one  year. She will contact the office in the interim with any problems.  Plan reviewed with Dr. Truett Perna.  Lonna Cobb ANP/GNP-BC

## 2013-03-01 NOTE — Telephone Encounter (Signed)
gv and printed appt schedule for March 2015

## 2013-03-03 ENCOUNTER — Other Ambulatory Visit: Payer: Self-pay | Admitting: Nurse Practitioner

## 2013-03-03 NOTE — Progress Notes (Signed)
Dr. Truett Perna discussed the indication for continuing tamoxifen beyond 5 years in the setting of a high-grade DCIS with his breast cancer colleagues. The recommendation was to discontinue tamoxifen at the five-year point. Dr. Truett Perna made Yvonne Barron aware of this recommendation. She understands to discontinue tamoxifen at the end of April 2014.

## 2014-01-13 ENCOUNTER — Other Ambulatory Visit: Payer: Self-pay

## 2014-01-13 DIAGNOSIS — Z9012 Acquired absence of left breast and nipple: Secondary | ICD-10-CM

## 2014-01-13 DIAGNOSIS — Z1231 Encounter for screening mammogram for malignant neoplasm of breast: Secondary | ICD-10-CM

## 2014-02-14 ENCOUNTER — Ambulatory Visit
Admission: RE | Admit: 2014-02-14 | Discharge: 2014-02-14 | Disposition: A | Discharge status: Home / Self Care | Payer: BC Managed Care – PPO | Source: Ambulatory Visit

## 2014-02-14 DIAGNOSIS — Z9012 Acquired absence of left breast and nipple: Secondary | ICD-10-CM

## 2014-02-14 DIAGNOSIS — Z1231 Encounter for screening mammogram for malignant neoplasm of breast: Secondary | ICD-10-CM

## 2014-03-01 ENCOUNTER — Telehealth: Payer: Self-pay | Admitting: Oncology

## 2014-03-01 ENCOUNTER — Ambulatory Visit (HOSPITAL_BASED_OUTPATIENT_CLINIC_OR_DEPARTMENT_OTHER): Payer: BC Managed Care – PPO | Admitting: Oncology

## 2014-03-01 ENCOUNTER — Other Ambulatory Visit (HOSPITAL_BASED_OUTPATIENT_CLINIC_OR_DEPARTMENT_OTHER): Payer: BC Managed Care – PPO

## 2014-03-01 VITALS — BP 116/94 | HR 60 | Temp 97.8°F | Resp 19 | Ht 64.0 in | Wt 143.2 lb

## 2014-03-01 DIAGNOSIS — Z87898 Personal history of other specified conditions: Secondary | ICD-10-CM

## 2014-03-01 DIAGNOSIS — C50912 Malignant neoplasm of unspecified site of left female breast: Secondary | ICD-10-CM

## 2014-03-01 DIAGNOSIS — I10 Essential (primary) hypertension: Secondary | ICD-10-CM

## 2014-03-01 LAB — CBC WITH DIFFERENTIAL/PLATELET
BASO%: 1.1 % (ref 0.0–2.0)
BASOS ABS: 0.1 10*3/uL (ref 0.0–0.1)
EOS ABS: 0.2 10*3/uL (ref 0.0–0.5)
EOS%: 3.3 % (ref 0.0–7.0)
HCT: 37.1 % (ref 34.8–46.6)
HEMOGLOBIN: 11.8 g/dL (ref 11.6–15.9)
LYMPH#: 2.6 10*3/uL (ref 0.9–3.3)
LYMPH%: 37.7 % (ref 14.0–49.7)
MCH: 26.5 pg (ref 25.1–34.0)
MCHC: 31.9 g/dL (ref 31.5–36.0)
MCV: 83 fL (ref 79.5–101.0)
MONO#: 0.5 10*3/uL (ref 0.1–0.9)
MONO%: 8 % (ref 0.0–14.0)
NEUT%: 49.9 % (ref 38.4–76.8)
NEUTROS ABS: 3.4 10*3/uL (ref 1.5–6.5)
Platelets: 279 10*3/uL (ref 145–400)
RBC: 4.47 10*6/uL (ref 3.70–5.45)
RDW: 14.8 % — AB (ref 11.2–14.5)
WBC: 6.9 10*3/uL (ref 3.9–10.3)

## 2014-03-01 NOTE — Telephone Encounter (Signed)
gv adn printed aptp sched and avs for pt for March 2016 °

## 2014-03-01 NOTE — Progress Notes (Signed)
   [facility]    OFFICE PROGRESS NOTE   INTERVAL HISTORY:   Yvonne Barron returns for followup of DCIS. She completed tamoxifen in April of 2014. A right mammogram was negative 02/14/2014. She feels well. She reports transient "tightness "in the mornings at the left anterior chest. She sleeps on her stomach. No palpable change over the chest wall. Minor hot flashes.  Objective:  Vital signs in last 24 hours:  Blood pressure 116/94, pulse 60, temperature 97.8 F (36.6 C), temperature source Oral, resp. rate 19, height 5\' 4"  (1.626 m), weight 143 lb 3.2 oz (64.955 kg).    HEENT: Neck without mass Lymphatics: No cervical, supraclavicular, or axillary nodes Resp: Lungs clear bilaterally Cardio: Regular rate and rhythm GI: No hepatosplenomegaly Vascular: No leg edema Breasts: Status post left mastectomy. No evidence for chest wall tumor recurrence. Right breast without mass.      Lab Results:  Lab Results  Component Value Date   WBC 6.9 03/01/2014   HGB 11.8 03/01/2014   HCT 37.1 03/01/2014   MCV 83.0 03/01/2014   PLT 279 03/01/2014   NEUTROABS 3.4 03/01/2014      Medications: I have reviewed the patient's current medications.  Assessment/Plan: 1. High-grade ductal carcinoma in situ of the left breast, status post left mastectomy and negative sentinel lymph node biopsy. She began adjuvant tamoxifen 04/04/2008. . The tamoxifen dose was increased to 20 mg twice daily after she was found to be an intermediate metabolizer on the [facility] pharmacogenomics study. Tamoxifen was completed at the end of April 2014. 2.  Hypertension. 3. History of red cell microcytosis with a borderline low hemoglobin 10/17/2008. The MCV, hemoglobin, and ferritin were normal on 10/12/2009. The MCV and hemoglobin are in the low normal range today. 4. "Shingles" January 2014. She completed a course of valacyclovir.   Disposition:  Yvonne Barron remains in clinical remission from breast cancer. She  will continue yearly mammography. She would like to continue followup at the [facility]. She will return for an office visit in one year. She will contact us for a palpable change at the left chest wall.   Betsy Coder, MD  03/01/2014  10:52 AM

## 2015-02-03 ENCOUNTER — Other Ambulatory Visit: Payer: Self-pay

## 2015-02-03 DIAGNOSIS — Z1231 Encounter for screening mammogram for malignant neoplasm of breast: Secondary | ICD-10-CM

## 2015-02-22 ENCOUNTER — Ambulatory Visit
Admission: RE | Admit: 2015-02-22 | Discharge: 2015-02-22 | Disposition: A | Discharge status: Home / Self Care | Payer: 59 | Source: Ambulatory Visit

## 2015-02-22 DIAGNOSIS — Z1231 Encounter for screening mammogram for malignant neoplasm of breast: Secondary | ICD-10-CM

## 2015-02-27 ENCOUNTER — Ambulatory Visit (HOSPITAL_BASED_OUTPATIENT_CLINIC_OR_DEPARTMENT_OTHER): Payer: 59 | Admitting: Oncology

## 2015-02-27 ENCOUNTER — Telehealth: Payer: Self-pay | Admitting: Oncology

## 2015-02-27 VITALS — BP 131/74 | HR 62 | Temp 98.4°F | Resp 18 | Ht 64.0 in | Wt 145.7 lb

## 2015-02-27 DIAGNOSIS — C50912 Malignant neoplasm of unspecified site of left female breast: Secondary | ICD-10-CM

## 2015-02-27 DIAGNOSIS — Z853 Personal history of malignant neoplasm of breast: Secondary | ICD-10-CM

## 2015-02-27 NOTE — Telephone Encounter (Signed)
gv adn pritned appt sched and avs fo rpt for March 2017

## 2015-02-27 NOTE — Progress Notes (Signed)
  [facility] OFFICE PROGRESS NOTE   Diagnosis: DCIS  INTERVAL HISTORY:   Ms. Yvonne Barron returns as scheduled. She feels well. She has occasional "shooting" pains in the right breast. No palpable abnormality. A right mammogram on 02/22/2015 was negative.  Objective:  Vital signs in last 24 hours:  Blood pressure 131/74, pulse 62, temperature 98.4 F (36.9 C), temperature source Oral, resp. rate 18, height 5\' 4"  (1.626 m), weight 145 lb 11.2 oz (66.089 kg), SpO2 99 %.    HEENT: Neck without mass Lymphatics: No cervical, supra-clavicular, or axillary nodes Resp: Lungs clear bilaterally Cardio: Regular rate and rhythm GI: No hepatomegaly Vascular: No leg edema Breasts: Right breast without mass. Status post left mastectomy. No evidence for chest wall tumor recurrence.   Medications: I have reviewed the patient's current medications.  Assessment/Plan: 1. High-grade ductal carcinoma in situ of the left breast, status post left mastectomy and negative sentinel lymph node biopsy. She began adjuvant tamoxifen 04/04/2008. . The tamoxifen dose was increased to 20 mg twice daily after she was found to be an intermediate metabolizer on the [facility] pharmacogenomics study. Tamoxifen was completed at the end of April 2014. 2. Hypertension. 3. History of red cell microcytosis with a borderline low hemoglobin 10/17/2008. The MCV, hemoglobin, and ferritin were normal on 10/12/2009. The MCV and hemoglobin were normal on 03/01/2014 4. "Shingles" January 2014. She completed a course of valacyclovir.  Disposition:  Yvonne Barron remains in clinical remission from breast cancer. She would like to continue follow-up at the [facility]. She continues yearly mammography. She will return for an office visit in one year.  Betsy Coder, MD  02/27/2015  6:21 PM

## 2015-12-10 HISTORY — PX: TOE SURGERY: SHX1073

## 2016-02-26 ENCOUNTER — Ambulatory Visit (HOSPITAL_BASED_OUTPATIENT_CLINIC_OR_DEPARTMENT_OTHER): Payer: 59 | Admitting: Oncology

## 2016-02-26 ENCOUNTER — Telehealth: Payer: Self-pay | Admitting: Oncology

## 2016-02-26 VITALS — BP 116/78 | HR 67 | Temp 98.3°F | Resp 18 | Ht 64.0 in | Wt 148.7 lb

## 2016-02-26 DIAGNOSIS — Z853 Personal history of malignant neoplasm of breast: Secondary | ICD-10-CM

## 2016-02-26 DIAGNOSIS — C50912 Malignant neoplasm of unspecified site of left female breast: Secondary | ICD-10-CM

## 2016-02-26 NOTE — Telephone Encounter (Signed)
Scheduled patient appt per pof, avs report printed.  °

## 2016-02-26 NOTE — Progress Notes (Signed)
  Yvonne Barron OFFICE PROGRESS NOTE   Diagnosis: DCIS  INTERVAL HISTORY:   Yvonne Barron returns as scheduled. She feels well. No change over the right breast or left chest wall. She is scheduled to have a mammogram at Dr. Marijean Heath office later today.  Objective:  Vital signs in last 24 hours:  Blood pressure 116/78, pulse 67, temperature 98.3 F (36.8 C), temperature source Oral, resp. rate 18, height 5\' 4"  (1.626 m), weight 148 lb 11.2 oz (67.45 kg), SpO2 100 %.    HEENT: Neck without mass Lymphatics: No cervical, supraclavicular, or axillary nodes Resp: Lungs clear bilaterally Cardio: Regular rate and rhythm with an occasional premature beat GI: No hepatomegaly Vascular: No leg edema Breasts: Right breast without mass, status post left mastectomy. No evidence for chest wall tumor recurrence.  Mild tenderness in the right axilla, no mass.  Portacath/PICC-without erythema  Lab Results:  Lab Results  Component Value Date   WBC 6.9 03/01/2014   HGB 11.8 03/01/2014   HCT 37.1 03/01/2014   MCV 83.0 03/01/2014   PLT 279 03/01/2014   NEUTROABS 3.4 03/01/2014     Medications: I have reviewed the patient's current medications.  Assessment/Plan: 1. High-grade ductal carcinoma in situ of the left breast, status post left mastectomy and negative sentinel lymph node biopsy. She began adjuvant tamoxifen 04/04/2008. . The tamoxifen dose was increased to 20 mg twice daily after she was found to be an intermediate metabolizer on the [facility] pharmacogenomics study. Tamoxifen was completed at the end of April 2014. 2. Hypertension. 3. History of red cell microcytosis with a borderline low hemoglobin 10/17/2008. The MCV, hemoglobin, and ferritin were normal on 10/12/2009. The MCV and hemoglobin were normal on 03/01/2014 4. "Shingles" January 2014. She completed a course of valacyclovir.   Disposition:  Yvonne Barron remains in clinical remission from breast cancer. She will have  a mammogram via Dr. Ulanda Edison later today. She would like to continue follow-up at the [facility]. She will return for an office visit in one year.  Betsy Coder, MD  02/26/2016  12:11 PM

## 2016-03-08 ENCOUNTER — Other Ambulatory Visit: Payer: Self-pay | Admitting: Obstetrics and Gynecology

## 2016-03-08 DIAGNOSIS — Z9012 Acquired absence of left breast and nipple: Secondary | ICD-10-CM

## 2016-03-08 DIAGNOSIS — Z1231 Encounter for screening mammogram for malignant neoplasm of breast: Secondary | ICD-10-CM

## 2016-08-07 ENCOUNTER — Telehealth: Payer: Self-pay

## 2016-08-07 NOTE — Telephone Encounter (Signed)
Faxed signed mastectomy garment prescription to [facility].

## 2016-09-19 ENCOUNTER — Ambulatory Visit (HOSPITAL_BASED_OUTPATIENT_CLINIC_OR_DEPARTMENT_OTHER): Admit: 2016-09-19 | Payer: 59 | Admitting: Orthopedic Surgery

## 2016-09-19 ENCOUNTER — Encounter (HOSPITAL_BASED_OUTPATIENT_CLINIC_OR_DEPARTMENT_OTHER): Payer: Self-pay

## 2016-09-19 SURGERY — CHEILECTOMY
Anesthesia: General | Laterality: Right

## 2016-10-09 ENCOUNTER — Other Ambulatory Visit: Payer: Self-pay | Admitting: Orthopedic Surgery

## 2016-11-19 ENCOUNTER — Ambulatory Visit: Payer: 59 | Admitting: Physical Therapy

## 2016-11-20 ENCOUNTER — Ambulatory Visit: Payer: 59 | Admitting: Physical Therapy

## 2016-11-26 ENCOUNTER — Ambulatory Visit: Payer: 59 | Attending: General Surgery | Admitting: Physical Therapy

## 2016-11-26 ENCOUNTER — Encounter (HOSPITAL_BASED_OUTPATIENT_CLINIC_OR_DEPARTMENT_OTHER): Payer: Self-pay | Admitting: *Deleted

## 2016-11-26 DIAGNOSIS — R29898 Other symptoms and signs involving the musculoskeletal system: Secondary | ICD-10-CM | POA: Diagnosis present

## 2016-11-26 DIAGNOSIS — M25612 Stiffness of left shoulder, not elsewhere classified: Secondary | ICD-10-CM | POA: Insufficient documentation

## 2016-11-26 DIAGNOSIS — M25512 Pain in left shoulder: Secondary | ICD-10-CM | POA: Diagnosis present

## 2016-11-26 NOTE — Patient Instructions (Addendum)
Do the following a couple of times a day:  1) Scar mobilization:  Place your hand on the part of your mastectomy incision where you have the discomfort.  Pretend your hand is glued to the skin there, and stretch the skin in all different directions from that point.  It should feel like you're trying to loosen the incision from being stuck on the ribcage.  2) Inferior Capsule Stick Stretch I    Stand or sit, dowel in palm of arm to be stretched. Other arm, holding dowel in front of body, pushes outward and upward until arm being stretched is as high to the side as possible. Hold __5_ seconds. Repeat _5__ times per session. Do _2-3__ sessions per day.  Copyright  VHI. All rights reserved.    CHEST: Doorway, Bilateral - Standing    Standing in doorway, place hands on wall with elbows bent at shoulder height. Lean forward. Hold _15__ seconds. _3__ reps per set, __2_ sets per day, _7__ days per week  Copyright  VHI. All rights reserved.     Supine With Rotation    Lie, back flat, legs bent, feet together. Both arms should be straight out to the sides (not like in the picture here).  Lift both knees and feet up off the bed or floor.  Rotate knees to the right. Hold _30__ seconds. Repeat to other side if you like. Repeat _2__ times per session. Do _2__ sessions per day.  Copyright  VHI. All rights reserved.

## 2016-11-26 NOTE — Therapy (Addendum)
[place], [place], [address] Phone: [phone]   Fax:  [phone]  Physical Therapy Evaluation  Patient Details  Name: Yvonne Barron MRN: [id number] Date of Birth: 09-Oct-1946 Referring Provider: Dr. Jackolyn Confer  Encounter Date: 11/26/2016      PT End of Session - 11/26/16 1656    Visit Number 1   Number of Visits 9   Date for PT Re-Evaluation 01/08/17   PT Start Time 1600   PT Stop Time 1650   PT Time Calculation (min) 50 min   Activity Tolerance Patient tolerated treatment well   Behavior During Therapy [facility] for tasks assessed/performed      Past Medical History:  Diagnosis Date  . History of breast cancer 2009--  INVASIVE DUCTAL IN SITU--  S/P MASTECTOMY   NO RECURRENCE  . Hypertension   . Urethral caruncle     Past Surgical History:  Procedure Laterality Date  . CYSTOSCOPY WITH URETHRAL CARUNCLE  07/10/2012   Procedure: CYSTOSCOPY WITH URETHRAL CARUNCLE;  Surgeon: Claybon Jabs, MD;  Location: [facility];  Service: Urology;  Laterality: N/A;    EXCISION OF CARUNCLE   . LEFT TOTAL MASTECTOMY/ LEFT AXILLARY SENTINEL LYMPH NODE BX  03-01-2008   INVASIVE  DUCTAL CARCINOMA IN SITU  . LEFT WRIST SURG  2008   REPAIR NERVE  . VAGINAL HYSTERECTOMY  1998   W/ BILATERAL SALPINGOOPHECTOMY    There were no vitals filed for this visit.       Subjective Assessment - 11/26/16 1608    Subjective Has been having pains on the left side where I had the mastectomy.  I was really concerned about them because they've been lasting all day and into the evening; I haven't been doing anything out of the ordinary.   Pertinent History s/p left mastectomy and sentinel lymph node biopsy March 2009 for DCIS.  No chemo or radiation.  Took tamoxifen for five years.  HTN controlled with meds.  Doctor says she's borderline diabetic. Recent left chest wall aching that might last a day or two.     Patient Stated Goals reduce the pain that I have sometimes; maybe I can sleep on this left side better--it makes it kind of sore   Currently in Pain? Yes   Pain Score 0-No pain  up to 10/10   Pain Location Breast  right on the incision   Pain Orientation Right   Pain Descriptors / Indicators Throbbing   Pain Onset 1 to 4 weeks ago   Aggravating Factors  doesn't know; hasn't changed activities   Pain Relieving Factors rubbing it a little            [facility] PT Assessment - 11/26/16 0001      Assessment   Medical Diagnosis left chest pain s/p mastectomy for cancer 2009   Referring Provider Dr. Jackolyn Confer   Onset Date/Surgical Date --  March 2009   Hand Dominance Right   Prior Therapy none     Precautions   Precautions Other (comment)   Precaution Comments cancer precautions     Restrictions   Weight Bearing Restrictions No     Balance Screen   Has the patient fallen in the past 6 months No   Has the patient had a decrease in activity level because of a fear of falling?  No   Is the patient reluctant to leave their home because of a fear of falling?  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 at 4 mph     Cognition   Overall Cognitive Status Within Functional Limits for tasks assessed     Observation/Other Assessments   Observations left mastectomy incision well healed; patient has some extra tissuein the area.  Pt. identifies localized painful area as at medial aspect of incision, about one inch in from medial border.  Tissue is just slightly less mobile here than  elsewhere in the area.   Quick DASH  45.45     ROM / Strength   AROM / PROM / Strength AROM;Strength     AROM   AROM Assessment Site Shoulder   Right/Left Shoulder Right;Left   Right Shoulder Flexion 143 Degrees   Right Shoulder ABduction 166 Degrees   Right Shoulder Internal Rotation --  WFL in supine   Right Shoulder External Rotation --  WFL in supine   Right Shoulder Horizontal ABduction 25 Degrees   Left Shoulder Flexion 142 Degrees   Left Shoulder ABduction 150 Degrees   Left Shoulder Internal Rotation --  WFL in supine   Left Shoulder External Rotation --  WFL in supine   Left Shoulder Horizontal ABduction 30 Degrees     Strength   Overall Strength Comments both shoulders grossly 4+ to 5/5              Quick Dash - 11/26/16 0001    Open a tight or new jar Moderate difficulty   Do heavy household chores (wash walls, wash floors) Moderate difficulty   Carry a shopping bag or briefcase Moderate difficulty   Wash your back Moderate difficulty   Use a knife to cut food Moderate difficulty   Recreational activities in which you take some force or impact through your arm, shoulder, or hand (golf, hammering, tennis) Moderate difficulty   During the past week, to what extent has your arm, shoulder or hand problem interfered with your normal social activities with family, friends, neighbors, or groups? Slightly   During the past week, to what extent has your arm, shoulder or hand problem limited your work or other regular daily activities Slightly   Arm, shoulder, or hand pain. Moderate   Tingling (pins and needles) in your arm, shoulder, or hand Mild   Difficulty Sleeping Severe difficulty   DASH Score 45.45 %                     PT Education - 11/26/16 1654    Education provided Yes   Education Details scar mobilization, dowel left shoulder abduction stretch, doorway stretch, lower trunk rotation with arms outstretched for left chest stretch    Person(s) Educated Patient   Methods Explanation;Demonstration;Verbal cues;Handout   Comprehension Verbalized understanding;Returned demonstration                [facility] Goals - 11/26/16 1706      CC Long Term Goal  #1   Title Patient will report at least 70% decrease in pain intensity and/or frequency   Time  4   Period Weeks   Status New     CC Long Term Goal  #2   Title Patient will be independent in HEP for left shoulder and chest ROM, possibly strengthening   Time 4   Period Weeks   Status New     CC Long Term Goal  #3   Title left shoulder active abduction to at least 155 degrees   Time 4   Period Weeks   Status New     CC Long Term Goal  #4   Title Quick DASH score reduced to 20 or less   Baseline 45.45 at eval   Time 4   Period Weeks   Status New            Plan - 11/26/16 1657    Clinical Impression Statement This is a pleasant woman almost nine years post left mastectomy who has recently had incisional pain that, unlike earlier, was unrelenting for a day or two.  Her chest scar tissue is fairly mobile except in the very spot where she localizes the pain.  She does have decreased left shoulder abduction compared to right.  Eval is of low complexity with little comorbidity.  Patient is to have foot surgery in two days on 11/28/16.   Rehab Potential Good   Clinical Impairments Affecting Rehab Potential none   PT Frequency --  1-2x/week   PT Duration 4 weeks   PT Treatment/Interventions ADLs/Self Care Home Management;Therapeutic exercise;Patient/family education;Manual techniques;Scar mobilization;Passive range of motion   PT Next Visit Plan review and progress HEP for soft tissue mobility of left chest and shoulder; myofascial release techniques for left chest area.  NOTE that patient is to have foot surgery on 11/28/16, so she has not scheduled follow-up yet, as she wants to wait to see how her mobility is after that.  We may see her once a week  the next two weeks, then could go to once or twice a week depending on how well she is doing with self-treatment at home and how much manual therapy here is making a difference.   Consulted and Agree with Plan of Care Patient      Patient will benefit from skilled therapeutic intervention in order to improve the following deficits and impairments:  Decreased scar mobility, Decreased range of motion, Pain  Visit Diagnosis: Left shoulder pain, unspecified chronicity - Plan: PT plan of care cert/re-cert  Stiffness of left shoulder, not elsewhere classified - Plan: PT plan of care cert/re-cert  Other symptoms and signs involving the musculoskeletal system - Plan: PT plan of care cert/re-cert      G-Codes - 92/11/94 1709    Functional Assessment Tool Used quick DASH   Functional Limitation Carrying, moving and handling objects   Carrying, Moving and Handling Objects Current Status (R7408) At least 40 percent but less than 60 percent impaired, limited or restricted   Carrying, Moving and Handling Objects Goal Status (X4481) At least 20 percent but less than 40 percent impaired, limited or restricted       Problem List Patient Active Problem List   Diagnosis Date Noted  . Urethral caruncle 07/10/2012  . Breast cancer, left breast ([place]) 08/06/2011  . PARESTHESIA 07/01/2008  . HYPERTENSION 06/17/2007    Jemima Petko 11/26/2016, 5:12 PM  [place], [place], [address] Phone: [phone]   Fax:  [phone]  Name: Yvonne Barron MRN: [id number] Date of Birth: 10/16/1946  Serafina Royals, PT  11/26/16 5:12 PM  PHYSICAL THERAPY DISCHARGE SUMMARY  Visits from Start of Care: 1  Current functional level related to goals / functional outcomes: Unknown, as patient did not return for follow-up.  We made a plan together for therapy 1-2x/week for up to 4 weeks, but patient was to have foot surgery two  days after this evaluation.  It is unknown whether this prevented her from coming back.   Remaining deficits: unknown   Education / Equipment: How treatment could help her Plan: Patient agrees to discharge.  Patient goals were not met. Patient is being discharged due to not returning since the last visit.  ?????    Serafina Royals, PT 02/19/18 4:21 PM

## 2016-11-27 ENCOUNTER — Encounter (HOSPITAL_BASED_OUTPATIENT_CLINIC_OR_DEPARTMENT_OTHER)
Admission: RE | Admit: 2016-11-27 | Discharge: 2016-11-27 | Disposition: A | Discharge status: Home / Self Care | Payer: 59 | Source: Ambulatory Visit | Attending: Orthopedic Surgery | Admitting: Orthopedic Surgery

## 2016-11-27 ENCOUNTER — Other Ambulatory Visit: Payer: Self-pay

## 2016-11-27 DIAGNOSIS — Z87891 Personal history of nicotine dependence: Secondary | ICD-10-CM | POA: Diagnosis not present

## 2016-11-27 DIAGNOSIS — I10 Essential (primary) hypertension: Secondary | ICD-10-CM | POA: Diagnosis not present

## 2016-11-27 DIAGNOSIS — Z9012 Acquired absence of left breast and nipple: Secondary | ICD-10-CM | POA: Diagnosis not present

## 2016-11-27 DIAGNOSIS — Z853 Personal history of malignant neoplasm of breast: Secondary | ICD-10-CM | POA: Diagnosis not present

## 2016-11-27 DIAGNOSIS — Z9889 Other specified postprocedural states: Secondary | ICD-10-CM | POA: Diagnosis not present

## 2016-11-27 DIAGNOSIS — M2021 Hallux rigidus, right foot: Secondary | ICD-10-CM | POA: Diagnosis not present

## 2016-11-27 DIAGNOSIS — Z79899 Other long term (current) drug therapy: Secondary | ICD-10-CM | POA: Diagnosis not present

## 2016-11-27 DIAGNOSIS — Z90710 Acquired absence of both cervix and uterus: Secondary | ICD-10-CM | POA: Diagnosis not present

## 2016-11-27 DIAGNOSIS — Z833 Family history of diabetes mellitus: Secondary | ICD-10-CM | POA: Diagnosis not present

## 2016-11-27 LAB — BASIC METABOLIC PANEL
ANION GAP: 7 (ref 5–15)
BUN: 8 mg/dL (ref 6–20)
CALCIUM: 9.5 mg/dL (ref 8.9–10.3)
CHLORIDE: 102 mmol/L (ref 101–111)
CO2: 33 mmol/L — AB (ref 22–32)
Creatinine, Ser: 0.66 mg/dL (ref 0.44–1.00)
GFR calc non Af Amer: >60 mL/min (ref >60)
Glucose, Bld: 114 mg/dL — ABNORMAL HIGH (ref 65–99)
Potassium: 3.1 mmol/L — ABNORMAL LOW (ref 3.5–5.1)
SODIUM: 142 mmol/L (ref 135–145)

## 2016-11-27 NOTE — Progress Notes (Signed)
Pre-op ekg reviewed by Dr. Thomes Cake. OK to proceed with sx scheduled 11/28/2016

## 2016-11-28 ENCOUNTER — Encounter (HOSPITAL_BASED_OUTPATIENT_CLINIC_OR_DEPARTMENT_OTHER): Payer: Self-pay | Admitting: Anesthesiology

## 2016-11-28 ENCOUNTER — Ambulatory Visit (HOSPITAL_BASED_OUTPATIENT_CLINIC_OR_DEPARTMENT_OTHER): Payer: 59 | Admitting: Anesthesiology

## 2016-11-28 ENCOUNTER — Encounter (HOSPITAL_BASED_OUTPATIENT_CLINIC_OR_DEPARTMENT_OTHER)
Admission: RE | Disposition: A | Discharge status: Home / Self Care | Payer: Self-pay | Source: Ambulatory Visit | Attending: Orthopedic Surgery

## 2016-11-28 ENCOUNTER — Ambulatory Visit (HOSPITAL_BASED_OUTPATIENT_CLINIC_OR_DEPARTMENT_OTHER)
Admission: RE | Admit: 2016-11-28 | Discharge: 2016-11-28 | Disposition: A | Discharge status: Home / Self Care | Payer: 59 | Source: Ambulatory Visit | Attending: Orthopedic Surgery | Admitting: Orthopedic Surgery

## 2016-11-28 DIAGNOSIS — Z853 Personal history of malignant neoplasm of breast: Secondary | ICD-10-CM | POA: Insufficient documentation

## 2016-11-28 DIAGNOSIS — M2021 Hallux rigidus, right foot: Secondary | ICD-10-CM

## 2016-11-28 DIAGNOSIS — Z87891 Personal history of nicotine dependence: Secondary | ICD-10-CM | POA: Insufficient documentation

## 2016-11-28 DIAGNOSIS — Z9012 Acquired absence of left breast and nipple: Secondary | ICD-10-CM | POA: Insufficient documentation

## 2016-11-28 DIAGNOSIS — Z833 Family history of diabetes mellitus: Secondary | ICD-10-CM | POA: Insufficient documentation

## 2016-11-28 DIAGNOSIS — I10 Essential (primary) hypertension: Secondary | ICD-10-CM | POA: Insufficient documentation

## 2016-11-28 DIAGNOSIS — Z79899 Other long term (current) drug therapy: Secondary | ICD-10-CM | POA: Insufficient documentation

## 2016-11-28 DIAGNOSIS — Z9889 Other specified postprocedural states: Secondary | ICD-10-CM | POA: Insufficient documentation

## 2016-11-28 DIAGNOSIS — Z90710 Acquired absence of both cervix and uterus: Secondary | ICD-10-CM | POA: Insufficient documentation

## 2016-11-28 SURGERY — CHEILECTOMY, GREAT TOE, WITH IMPLANT INSERTION
Anesthesia: Regional | Site: Foot | Laterality: Right

## 2016-11-28 MED ORDER — SODIUM CHLORIDE 0.9 % IV SOLN: INTRAVENOUS | Status: DC

## 2016-11-28 MED ORDER — FENTANYL CITRATE (PF) 100 MCG/2ML IJ SOLN
INTRAMUSCULAR | Status: AC
  Filled 2016-11-28: qty 2

## 2016-11-28 MED ORDER — ROPIVACAINE HCL 5 MG/ML IJ SOLN
INTRAMUSCULAR | Status: DC | PRN
  Administered 2016-11-28: 10 mL

## 2016-11-28 MED ORDER — MEPERIDINE HCL 25 MG/ML IJ SOLN: 6.2500 mg | INTRAMUSCULAR | Status: DC | PRN

## 2016-11-28 MED ORDER — MIDAZOLAM HCL 2 MG/2ML IJ SOLN
INTRAMUSCULAR | Status: AC
  Filled 2016-11-28: qty 2

## 2016-11-28 MED ORDER — MIDAZOLAM HCL 2 MG/2ML IJ SOLN
1.0000 mg | INTRAMUSCULAR | Status: DC | PRN
  Administered 2016-11-28: 1 mg via INTRAVENOUS

## 2016-11-28 MED ORDER — ONDANSETRON HCL 4 MG/2ML IJ SOLN
INTRAMUSCULAR | Status: DC | PRN
  Administered 2016-11-28: 4 mg via INTRAVENOUS

## 2016-11-28 MED ORDER — CEFAZOLIN SODIUM-DEXTROSE 2-4 GM/100ML-% IV SOLN
2.0000 g | INTRAVENOUS | Status: AC
  Administered 2016-11-28: 2 g via INTRAVENOUS

## 2016-11-28 MED ORDER — CHLORHEXIDINE GLUCONATE 4 % EX LIQD: 60.0000 mL | Freq: Once | CUTANEOUS | Status: DC

## 2016-11-28 MED ORDER — LACTATED RINGERS IV SOLN
INTRAVENOUS | Status: DC
  Administered 2016-11-28 (×2): via INTRAVENOUS

## 2016-11-28 MED ORDER — HYDROCODONE-ACETAMINOPHEN 5-325 MG PO TABS: 1.0000 | ORAL_TABLET | Freq: Four times a day (QID) | ORAL | 0 refills | Status: DC | PRN

## 2016-11-28 MED ORDER — 0.9 % SODIUM CHLORIDE (POUR BTL) OPTIME
TOPICAL | Status: DC | PRN
  Administered 2016-11-28: 150 mL

## 2016-11-28 MED ORDER — FENTANYL CITRATE (PF) 100 MCG/2ML IJ SOLN
50.0000 ug | INTRAMUSCULAR | Status: DC | PRN
  Administered 2016-11-28: 50 ug via INTRAVENOUS

## 2016-11-28 MED ORDER — SCOPOLAMINE 1 MG/3DAYS TD PT72: 1.0000 | MEDICATED_PATCH | Freq: Once | TRANSDERMAL | Status: DC | PRN

## 2016-11-28 MED ORDER — BUPIVACAINE-EPINEPHRINE (PF) 0.5% -1:200000 IJ SOLN
INTRAMUSCULAR | Status: DC | PRN
  Administered 2016-11-28: 30 mL via PERINEURAL

## 2016-11-28 MED ORDER — HYDROCODONE-ACETAMINOPHEN 7.5-325 MG PO TABS: 1.0000 | ORAL_TABLET | Freq: Once | ORAL | Status: DC | PRN

## 2016-11-28 MED ORDER — PROPOFOL 500 MG/50ML IV EMUL
INTRAVENOUS | Status: AC
  Filled 2016-11-28: qty 50

## 2016-11-28 MED ORDER — METOCLOPRAMIDE HCL 5 MG/ML IJ SOLN: 10.0000 mg | Freq: Once | INTRAMUSCULAR | Status: DC | PRN

## 2016-11-28 MED ORDER — PROPOFOL 10 MG/ML IV BOLUS
INTRAVENOUS | Status: DC | PRN
Start: 2016-11-28 — End: 2016-11-28
  Administered 2016-11-28: 180 mg via INTRAVENOUS

## 2016-11-28 MED ORDER — LIDOCAINE 2% (20 MG/ML) 5 ML SYRINGE
INTRAMUSCULAR | Status: DC | PRN
Start: 2016-11-28 — End: 2016-11-28
  Administered 2016-11-28: 60 mg via INTRAVENOUS

## 2016-11-28 MED ORDER — CEFAZOLIN SODIUM-DEXTROSE 2-4 GM/100ML-% IV SOLN
INTRAVENOUS | Status: AC
  Filled 2016-11-28: qty 100

## 2016-11-28 MED ORDER — DEXAMETHASONE SODIUM PHOSPHATE 10 MG/ML IJ SOLN
INTRAMUSCULAR | Status: DC | PRN
Start: 2016-11-28 — End: 2016-11-28
  Administered 2016-11-28: 10 mg via INTRAVENOUS

## 2016-11-28 SURGICAL SUPPLY — 60 items
BANDAGE ESMARK 6X9 LF (GAUZE/BANDAGES/DRESSINGS) ×1 IMPLANT
BLADE AVERAGE 25X9 (BLADE) ×2 IMPLANT
BLADE SURG 10 STRL SS (BLADE) ×2 IMPLANT
BLADE SURG 15 STRL LF DISP TIS (BLADE) ×2 IMPLANT
BLADE SURG 15 STRL SS (BLADE) ×2
BNDG COHESIVE 4X5 TAN STRL (GAUZE/BANDAGES/DRESSINGS) ×2 IMPLANT
BNDG CONFORM 2 STRL LF (GAUZE/BANDAGES/DRESSINGS) ×2 IMPLANT
BNDG CONFORM 3 STRL LF (GAUZE/BANDAGES/DRESSINGS) IMPLANT
BNDG ESMARK 4X9 LF (GAUZE/BANDAGES/DRESSINGS) ×2 IMPLANT
BNDG ESMARK 6X9 LF (GAUZE/BANDAGES/DRESSINGS) ×2
CHLORAPREP W/TINT 26ML (MISCELLANEOUS) ×2 IMPLANT
COVER BACK TABLE 60X90IN (DRAPES) ×2 IMPLANT
CUFF TOURNIQUET SINGLE 18IN (TOURNIQUET CUFF) ×2 IMPLANT
CUFF TOURNIQUET SINGLE 34IN LL (TOURNIQUET CUFF) IMPLANT
DRAPE EXTREMITY T 121X128X90 (DRAPE) ×2 IMPLANT
DRAPE OEC MINIVIEW 54X84 (DRAPES) IMPLANT
DRAPE U-SHAPE 47X51 STRL (DRAPES) ×2 IMPLANT
DRSG MEPITEL 4X7.2 (GAUZE/BANDAGES/DRESSINGS) ×2 IMPLANT
DRSG PAD ABDOMINAL 8X10 ST (GAUZE/BANDAGES/DRESSINGS) ×2 IMPLANT
ELECT REM PT RETURN 9FT ADLT (ELECTROSURGICAL) ×2
ELECTRODE REM PT RTRN 9FT ADLT (ELECTROSURGICAL) ×1 IMPLANT
GAUZE SPONGE 4X4 12PLY STRL (GAUZE/BANDAGES/DRESSINGS) ×2 IMPLANT
GLOVE BIO SURGEON STRL SZ8 (GLOVE) ×2 IMPLANT
GLOVE BIOGEL M STRL SZ7.5 (GLOVE) ×2 IMPLANT
GLOVE BIOGEL PI IND STRL 7.0 (GLOVE) ×2 IMPLANT
GLOVE BIOGEL PI IND STRL 8 (GLOVE) ×2 IMPLANT
GLOVE BIOGEL PI INDICATOR 7.0 (GLOVE) ×2
GLOVE BIOGEL PI INDICATOR 8 (GLOVE) ×2
GLOVE ECLIPSE 7.5 STRL STRAW (GLOVE) IMPLANT
GOWN STRL REUS W/ TWL LRG LVL3 (GOWN DISPOSABLE) ×1 IMPLANT
GOWN STRL REUS W/ TWL XL LVL3 (GOWN DISPOSABLE) ×1 IMPLANT
GOWN STRL REUS W/TWL LRG LVL3 (GOWN DISPOSABLE) ×1
GOWN STRL REUS W/TWL XL LVL3 (GOWN DISPOSABLE) ×1
IMPL MTP CARTIVA 10MM (Orthopedic Implant) ×1 IMPLANT
IMPLANT MTP CARTIVA 10MM (Orthopedic Implant) ×2 IMPLANT
NEEDLE HYPO 25X1 1.5 SAFETY (NEEDLE) IMPLANT
NS IRRIG 1000ML POUR BTL (IV SOLUTION) ×2 IMPLANT
PACK BASIN DAY SURGERY FS (CUSTOM PROCEDURE TRAY) ×2 IMPLANT
PAD CAST 4YDX4 CTTN HI CHSV (CAST SUPPLIES) ×1 IMPLANT
PADDING CAST ABS 4INX4YD NS (CAST SUPPLIES)
PADDING CAST ABS COTTON 4X4 ST (CAST SUPPLIES) IMPLANT
PADDING CAST COTTON 4X4 STRL (CAST SUPPLIES) ×1
PENCIL BUTTON HOLSTER BLD 10FT (ELECTRODE) ×2 IMPLANT
SANITIZER HAND PURELL 535ML FO (MISCELLANEOUS) ×2 IMPLANT
SHEET MEDIUM DRAPE 40X70 STRL (DRAPES) ×2 IMPLANT
SLEEVE SCD COMPRESS KNEE MED (MISCELLANEOUS) ×2 IMPLANT
SPONGE LAP 18X18 X RAY DECT (DISPOSABLE) ×2 IMPLANT
STOCKINETTE 6  STRL (DRAPES) ×1
STOCKINETTE 6 STRL (DRAPES) ×1 IMPLANT
SUCTION FRAZIER HANDLE 10FR (MISCELLANEOUS) ×1
SUCTION TUBE FRAZIER 10FR DISP (MISCELLANEOUS) ×1 IMPLANT
SUT ETHILON 3 0 PS 1 (SUTURE) ×2 IMPLANT
SUT MNCRL AB 3-0 PS2 18 (SUTURE) ×2 IMPLANT
SUT VIC AB 2-0 SH 27 (SUTURE) ×1
SUT VIC AB 2-0 SH 27XBRD (SUTURE) ×1 IMPLANT
SYR BULB 3OZ (MISCELLANEOUS) ×2 IMPLANT
SYR CONTROL 10ML LL (SYRINGE) IMPLANT
TOWEL OR 17X24 6PK STRL BLUE (TOWEL DISPOSABLE) ×4 IMPLANT
TUBE CONNECTING 20X1/4 (TUBING) ×2 IMPLANT
UNDERPAD 30X30 (UNDERPADS AND DIAPERS) ×2 IMPLANT

## 2016-11-28 NOTE — Anesthesia Postprocedure Evaluation (Signed)
Anesthesia Post Note  Patient: Yvonne Barron  Procedure(s) Performed: Procedure(s) (LRB): RIGHT HALLUX METATARSALPHALANGEAL JOINT CHEILECTOMY AND RESURFACING (Right)  Patient location during evaluation: PACU Anesthesia Type: General Level of consciousness: awake and alert and oriented Pain management: pain level controlled Vital Signs Assessment: post-procedure vital signs reviewed and stable Respiratory status: spontaneous breathing, nonlabored ventilation and respiratory function stable Cardiovascular status: blood pressure returned to baseline and stable Postop Assessment: no signs of nausea or vomiting Anesthetic complications: no       Last Vitals:  Vitals:   11/28/16 1206 11/28/16 1207  BP:  122/83  Pulse: (!) 57 (!) 58  Resp: 13 16  Temp:      Last Pain:  Vitals:   11/28/16 1142  TempSrc: Oral                 Precilla Purnell A.

## 2016-11-28 NOTE — Anesthesia Procedure Notes (Signed)
Procedure Name: LMA Insertion Date/Time: 11/28/2016 12:30 PM Performed by: Maryella Shivers Pre-anesthesia Checklist: Patient identified, Emergency Drugs available, Suction available and Patient being monitored Patient Re-evaluated:Patient Re-evaluated prior to inductionOxygen Delivery Method: Circle system utilized Preoxygenation: Pre-oxygenation with 100% oxygen Intubation Type: IV induction Ventilation: Mask ventilation without difficulty LMA: LMA inserted LMA Size: 4.0 Number of attempts: 1 Airway Equipment and Method: Bite block Placement Confirmation: positive ETCO2 Tube secured with: Tape Dental Injury: Teeth and Oropharynx as per pre-operative assessment

## 2016-11-28 NOTE — Progress Notes (Signed)
Assisted Dr. Royce Macadamia with right, ultrasound guided, popliteal/saphenous block. Side rails up, monitors on throughout procedure. See vital signs in flow sheet. Tolerated Procedure well.

## 2016-11-28 NOTE — Op Note (Signed)
[name], [name] NO.:  [id number]  MEDICAL RECORD NO.:  [id number]  LOCATION:                                 FACILITY:  PHYSICIAN:  Wylene Simmer, MD             DATE OF BIRTH:  DATE OF PROCEDURE:  11/28/2016 DATE OF DISCHARGE:                              OPERATIVE REPORT   PREOPERATIVE DIAGNOSIS:  Right hallux rigidus.  POSTOPERATIVE DIAGNOSIS:  Right hallux rigidus.  PROCEDURE:  Right hallux metatarsophalangeal joint cheilectomy and joint resurfacing (Cartiva).  SURGEON:  Wylene Simmer, MD.  ANESTHESIA:  General, regional.  ESTIMATED BLOOD LOSS:  Minimal.  TOURNIQUET TIME:  22 minutes at 200 mmHg.  COMPLICATIONS:  None apparent.  DISPOSITION:  Extubated, awake, and stable to recovery.  INDICATION FOR PROCEDURE:  The patient is a 70 year old woman without significant past medical history.  She complains of a long history of right forefoot pain.  She has hallux rigidus and has failed nonoperative treatment to date.  She presents today for surgical correction.  She understands the risks and benefits of the alternative treatment options and elects surgical treatment.  She specifically understands risks of bleeding, infection, nerve damage, blood clots, need for additional surgery, continued pain, amputation, and death.  PROCEDURE IN DETAIL:  After preoperative consent was obtained and the correct operative site was identified, the patient was brought to the operating room and placed supine on the operating table.  General anesthesia was induced.  Preoperative antibiotics were administered. Surgical time-out was taken.  The right lower extremity was prepped and draped in standard sterile fashion with tourniquet around the thigh. The extremity was exsanguinated, and the calf tourniquet was inflated to 200 mmHg.  A longitudinal incision was then made over the hallux MP joint.  Sharp dissection was carried down through the skin and subcutaneous  tissue.  The extensor hallucis longus and brevis tendons were protected and the joint capsule was exposed.  Two large loose bodies were removed from the dorsal aspect of the joint.  Significant arthritic changes were noted on both sides of the joint.  Osteophytes were removed with a rongeur.  The collateral ligaments were released mobilizing the joint appropriately.  A Joker elevator was used to mobilize the sesamoid articulations.  A K-wire was then placed in the center portion of the head of the metatarsal.  A 10-mm reamer was then advanced over the K-wire and advanced to a depth just __________.  The bone fragments were all removed.  The wound was irrigated copiously. The Cartiva implant was then inserted without difficulty.  The joint was reduced.  Appropriate range of motion was noted.  Wound was again irrigated.  The dorsal joint capsule was repaired with 2-0 Vicryl simple sutures.  Subcutaneous tissues were approximated with inverted simple sutures of 3-0 Monocryl, and running 3-0 nylon was used to close the skin incision.  Sterile dressings were applied followed by compression wrap.  Tourniquet was released after application of dressings at 22 minutes.  The patient was awakened by Anesthesia and transported to the recovery room in stable condition.  FOLLOWUP PLAN:  The patient will be weightbearing as tolerated on the right  foot in a flat postop shoe.  She is going to follow up with me in the office in 2 weeks for suture removal and to initiate physical therapy.     Wylene Simmer, MD     JH/MEDQ  D:  11/28/2016  T:  11/28/2016  Job:  [id number]

## 2016-11-28 NOTE — Brief Op Note (Signed)
11/28/2016  1:19 PM  PATIENT:  Yvonne Barron  70 y.o. female  PRE-OPERATIVE DIAGNOSIS:  RIGHT HALLUX RIGIDUS  POST-OPERATIVE DIAGNOSIS:  RIGHT HALLUX RIGIDUS  Procedure(s): RIGHT HALLUX METATARSALPHALANGEAL JOINT CHEILECTOMY AND RESURFACING (cartiva)  SURGEON:  Wylene Simmer, MD  ASSISTANT: n/a  ANESTHESIA:   General, regional  EBL:  minimal   TOURNIQUET:   Total Tourniquet Time Documented: Calf (Right) - 22 minutes Total: Calf (Right) - 22 minutes  COMPLICATIONS:  None apparent  DISPOSITION:  Extubated, awake and stable to recovery.  DICTATION ID:  [id number]

## 2016-11-28 NOTE — Discharge Instructions (Addendum)
Wylene Simmer, MD [place]  Please read the following information regarding your care after surgery.  Medications  You only need a prescription for the narcotic pain medicine (ex. oxycodone, Percocet, Norco).  All of the other medicines listed below are available over the counter. X ibuprofen 800 mg every 8 hours as you need for minor to moderate pain X Norco as prescribed for severe pain  Narcotic pain medicine (ex. oxycodone, Percocet, Vicodin) will cause constipation.  To prevent this problem, take the following medicines while you are taking any pain medicine. X docusate sodium (Colace) 100 mg twice a day X senna (Senokot) 2 tablets twice a day  Weight Bearing X Bear weight when you are able on your operated leg or foot. ? Bear weight only on the heel of your operated foot in the post-op shoe. ? Do not bear any weight on the operated leg or foot.  Cast / Splint / Dressing X Keep your splint or cast clean and dry.  Dont put anything (coat hanger, pencil, etc) down inside of it.  If it gets damp, use a hair dryer on the cool setting to dry it.  If it gets soaked, call the office to schedule an appointment for a cast change. ? Remove your dressing 3 days after surgery and cover the incisions with dry dressings.    After your dressing, cast or splint is removed; you may shower, but do not soak or scrub the wound.  Allow the water to run over it, and then gently pat it dry.  Swelling It is normal for you to have swelling where you had surgery.  To reduce swelling and pain, keep your toes above your nose for at least 3 days after surgery.  It may be necessary to keep your foot or leg elevated for several weeks.  If it hurts, it should be elevated.  Follow Up Call my office at [phone] when you are discharged from the hospital or surgery center to schedule an appointment to be seen two weeks after surgery.  Call my office at [phone] if you develop a fever >101.5 F,  nausea, vomiting, bleeding from the surgical site or severe pain.     Regional Anesthesia Blocks  1. Numbness or the inability to move the "blocked" extremity may last from 3-48 hours after placement. The length of time depends on the medication injected and your individual response to the medication. If the numbness is not going away after 48 hours, call your surgeon.  2. The extremity that is blocked will need to be protected until the numbness is gone and the  Strength has returned. Because you cannot feel it, you will need to take extra care to avoid injury. Because it may be weak, you may have difficulty moving it or using it. You may not know what position it is in without looking at it while the block is in effect.  3. For blocks in the legs and feet, returning to weight bearing and walking needs to be done carefully. You will need to wait until the numbness is entirely gone and the strength has returned. You should be able to move your leg and foot normally before you try and bear weight or walk. You will need someone to be with you when you first try to ensure you do not fall and possibly risk injury.  4. Bruising and tenderness at the needle site are common side effects and will resolve in a few days.  5. Persistent numbness  or new problems with movement should be communicated to the surgeon or the [facility] [phone] [place] [phone]).    Post Anesthesia Home Care Instructions  Activity: Get plenty of rest for the remainder of the day. A responsible adult should stay with you for 24 hours following the procedure.  For the next 24 hours, DO NOT: -Drive a car -Paediatric nurse -Drink alcoholic beverages -Take any medication unless instructed by your physician -Make any legal decisions or sign important papers.  Meals: Start with liquid foods such as gelatin or soup. Progress to regular foods as tolerated. Avoid greasy, spicy, heavy  foods. If nausea and/or vomiting occur, drink only clear liquids until the nausea and/or vomiting subsides. Call your physician if vomiting continues.  Special Instructions/Symptoms: Your throat may feel dry or sore from the anesthesia or the breathing tube placed in your throat during surgery. If this causes discomfort, gargle with warm salt water. The discomfort should disappear within 24 hours.  If you had a scopolamine patch placed behind your ear for the management of post- operative nausea and/or vomiting:  1. The medication in the patch is effective for 72 hours, after which it should be removed.  Wrap patch in a tissue and discard in the trash. Wash hands thoroughly with soap and water. 2. You may remove the patch earlier than 72 hours if you experience unpleasant side effects which may include dry mouth, dizziness or visual disturbances. 3. Avoid touching the patch. Wash your hands with soap and water after contact with the patch.

## 2016-11-28 NOTE — Anesthesia Procedure Notes (Addendum)
Anesthesia Regional Block:  Popliteal block  Pre-Anesthetic Checklist: ,, timeout performed, Correct Patient, Correct Site, Correct Laterality, Correct Procedure, Correct Position, site marked, Risks and benefits discussed,  Surgical consent,  Pre-op evaluation,  At surgeon's request and post-op pain management  Laterality: Right  Prep: chloraprep       Needles:  Injection technique: Single-shot  Needle Type: Echogenic Stimulator Needle     Needle Length: 9cm 9 cm Needle Gauge: 21 and 21 G  Needle insertion depth: 5 cm   Additional Needles:  Procedures: ultrasound guided (picture in chart) and nerve stimulator Popliteal block Narrative:  Start time: 11/28/2016 11:55 AM End time: 11/28/2016 12:13 PM Injection made incrementally with aspirations every 5 mL.  Performed by: Personally  Anesthesiologist: Josephine Igo  Additional Notes: Ropivacaine 0.5% 4ml infiltrated right saphenous. Patient tolerated procedure well.

## 2016-11-28 NOTE — H&P (Signed)
Yvonne Barron is an 70 y.o. female.    Chief Complaint: right foot pain HPI: 70 y/o female with a long h/o right foot pain.  She has hallux rigidus and has failed non op treatment to date.  She presents today for surgical treatment.  She denies any changes in her health in recent weeks.  Past Medical History:  Diagnosis Date  . History of breast cancer 2009--  INVASIVE DUCTAL IN SITU--  S/P MASTECTOMY   NO RECURRENCE  . Hypertension   . Urethral caruncle     Past Surgical History:  Procedure Laterality Date  . CYSTOSCOPY WITH URETHRAL CARUNCLE  07/10/2012   Procedure: CYSTOSCOPY WITH URETHRAL CARUNCLE;  Surgeon: Claybon Jabs, MD;  Location: [facility];  Service: Urology;  Laterality: N/A;    EXCISION OF CARUNCLE   . LEFT TOTAL MASTECTOMY/ LEFT AXILLARY SENTINEL LYMPH NODE BX  03-01-2008   INVASIVE  DUCTAL CARCINOMA IN SITU  . LEFT WRIST SURG  2008   REPAIR NERVE  . VAGINAL HYSTERECTOMY  1998   W/ BILATERAL SALPINGOOPHECTOMY    Family History  Problem Relation Age of Onset  . Diabetes Mother   . Diabetes Father   . Diabetes Brother    Social History:  reports that she quit smoking about 29 years ago. She has a 40.00 pack-year smoking history. She has never used smokeless tobacco. She reports that she does not drink alcohol or use drugs.  Allergies: No Known Allergies  Medications Prior to Admission  Medication Sig Dispense Refill  . calcium carbonate (OS-CAL - DOSED IN MG OF ELEMENTAL CALCIUM) 1250 (500 Ca) MG tablet Take 1 tablet by mouth.    . cholecalciferol (VITAMIN D) 1000 units tablet Take 1,000 Units by mouth 2 (two) times daily.    . hydrochlorothiazide 25 MG tablet Take 25 mg by mouth daily.     . valACYclovir (VALTREX) 1000 MG tablet Take 1,000 mg by mouth as needed.    . vitamin C (ASCORBIC ACID) 500 MG tablet Take 500 mg by mouth daily.      Results for orders placed or performed during the hospital encounter of 11/28/16 (from the past 48  hour(s))  Basic metabolic panel     Status: Abnormal   Collection Time: 11/27/16  1:20 PM  Result Value Ref Range   Sodium 142 135 - 145 mmol/L   Potassium 3.1 (L) 3.5 - 5.1 mmol/L   Chloride 102 101 - 111 mmol/L   CO2 33 (H) 22 - 32 mmol/L   Glucose, Bld 114 (H) 65 - 99 mg/dL   BUN 8 6 - 20 mg/dL   Creatinine, Ser 0.66 0.44 - 1.00 mg/dL   Calcium 9.5 8.9 - 10.3 mg/dL   GFR calc non Af Amer >60 >60 mL/min   GFR calc Af Amer >60 >60 mL/min    Comment: (NOTE) The eGFR has been calculated using the CKD EPI equation. This calculation has not been validated in all clinical situations. eGFR's persistently <60 mL/min signify possible Chronic Kidney Disease.    Anion gap 7 5 - 15   No results found.  ROS  No recent f/c/n/v/wt loss  Blood pressure 121/87, pulse 67, temperature 98.1 F (36.7 C), temperature source Oral, resp. rate 19, height 5' 4"  (1.626 m), weight 67.7 kg (149 lb 4 oz), SpO2 100 %. Physical Exam  wn wd woman in nad.  A and O x 4.  Mood and affect normal.  EOMI.  Resp unlabored.  R foot with healthy skin, palpable pulses and normal sens to LT.  TTP over the hallux MPJ.  Decreased ROM at the hallux MPJ.    Assessment/Plan R hallux rigidus - to OR for cheilectomy and Cartiva joint resurfacing.  The risks and benefits of the alternative treatment options have been discussed in detail.  The patient wishes to proceed with surgery and specifically understands risks of bleeding, infection, nerve damage, blood clots, need for additional surgery, amputation and death.   Wylene Simmer, MD 2016/12/28, 11:51 AM

## 2016-11-28 NOTE — Anesthesia Preprocedure Evaluation (Addendum)
Anesthesia Evaluation  Patient identified by MRN, date of birth, ID band Patient awake    Reviewed: Allergy & Precautions, NPO status , Patient's Chart, lab work & pertinent test results  Airway Mallampati: II  TM Distance: >3 FB Neck ROM: Full    Dental  (+) Partial Upper   Pulmonary former smoker,    Pulmonary exam normal breath sounds clear to auscultation       Cardiovascular hypertension, Pt. on medications Normal cardiovascular exam Rhythm:Regular Rate:Normal     Neuro/Psych negative neurological ROS  negative psych ROS   GI/Hepatic negative GI ROS, Neg liver ROS,   Endo/Other  Hx./o Breast Ca  Renal/GU negative Renal ROS  negative genitourinary   Musculoskeletal Hallux rigidis right great toe   Abdominal (+) + obese,   Peds  Hematology negative hematology ROS (+)   Anesthesia Other Findings   Reproductive/Obstetrics                             Anesthesia Physical Anesthesia Plan  ASA: II  Anesthesia Plan: General and Regional   Post-op Pain Management:  Regional for Post-op pain   Induction: Intravenous  Airway Management Planned: LMA  Additional Equipment:   Intra-op Plan:   Post-operative Plan: Extubation in OR  Informed Consent: I have reviewed the patients History and Physical, chart, labs and discussed the procedure including the risks, benefits and alternatives for the proposed anesthesia with the patient or authorized representative who has indicated his/her understanding and acceptance.   Dental advisory given  Plan Discussed with:   Anesthesia Plan Comments:         Anesthesia Quick Evaluation

## 2016-11-28 NOTE — Transfer of Care (Signed)
Immediate Anesthesia Transfer of Care Note  Patient: Yvonne Barron  Procedure(s) Performed: Procedure(s): RIGHT HALLUX METATARSALPHALANGEAL JOINT CHEILECTOMY AND RESURFACING (Right)  Patient Location: PACU  Anesthesia Type:GA combined with regional for post-op pain  Level of Consciousness: sedated  Airway & Oxygen Therapy: Patient Spontanous Breathing and Patient connected to face mask oxygen  Post-op Assessment: Report given to RN and Post -op Vital signs reviewed and stable  Post vital signs: Reviewed and stable  Last Vitals:  Vitals:   11/28/16 1206 11/28/16 1207  BP:  122/83  Pulse: (!) 57 (!) 58  Resp: 13 16  Temp:      Last Pain:  Vitals:   11/28/16 1142  TempSrc: Oral         Complications: No apparent anesthesia complications

## 2017-02-06 ENCOUNTER — Other Ambulatory Visit: Payer: Self-pay | Admitting: Obstetrics and Gynecology

## 2017-02-06 DIAGNOSIS — Z1231 Encounter for screening mammogram for malignant neoplasm of breast: Secondary | ICD-10-CM

## 2017-02-25 ENCOUNTER — Ambulatory Visit: Payer: 59 | Admitting: Oncology

## 2017-03-05 ENCOUNTER — Ambulatory Visit
Admission: RE | Admit: 2017-03-05 | Discharge: 2017-03-05 | Disposition: A | Discharge status: Home / Self Care | Payer: 59 | Source: Ambulatory Visit | Attending: Obstetrics and Gynecology | Admitting: Obstetrics and Gynecology

## 2017-03-05 DIAGNOSIS — Z1231 Encounter for screening mammogram for malignant neoplasm of breast: Secondary | ICD-10-CM

## 2017-07-10 ENCOUNTER — Ambulatory Visit (HOSPITAL_BASED_OUTPATIENT_CLINIC_OR_DEPARTMENT_OTHER): Payer: Medicare PPO | Admitting: Oncology

## 2017-07-10 VITALS — BP 114/69 | HR 81 | Temp 98.4°F | Resp 17 | Ht 64.0 in | Wt 151.7 lb

## 2017-07-10 DIAGNOSIS — Z86000 Personal history of in-situ neoplasm of breast: Secondary | ICD-10-CM

## 2017-07-10 DIAGNOSIS — D0512 Intraductal carcinoma in situ of left breast: Secondary | ICD-10-CM

## 2017-07-10 NOTE — Progress Notes (Signed)
  [place] OFFICE PROGRESS NOTE   Diagnosis: Breast cancer  INTERVAL HISTORY:   Yvonne Barron returns for an unscheduled visit. She has noted "seeds "in the right areola for the past week. She feels well. Good appetite and energy level. No nipple discharge or bleeding. A mammogram on 03/05/2017 was negative.  Objective:  Vital signs in last 24 hours:  Blood pressure 114/69, pulse 81, temperature 98.4 F (36.9 C), temperature source Oral, resp. rate 17, height 5\' 4"  (1.626 m), weight 151 lb 11.2 oz (68.8 kg), SpO2 98 %.    HEENT: Neck without mass Lymphatics: No cervical, supraclavicular, or axillary nodes Resp: Lungs clear bilaterally Cardio: Regular rate and rhythm GI: No hepatomegaly Vascular: No leg edema  Breasts: Status post left mastectomy. No evidence for chest wall tumor recurrence. Right breast without mass. Areas irregular fibrillating of tissue diffusely at the areola. No discrete mass.   Medications: I have reviewed the patient's current medications.  Assessment/Plan: 1. High-grade ductal carcinoma in situ of the left breast, status post left mastectomy and negative sentinel lymph node biopsy. She began adjuvant tamoxifen 04/04/2008. . The tamoxifen dose was increased to 20 mg twice daily after she was found to be an intermediate metabolizer on the [facility] pharmacogenomics study. Tamoxifen was completed at the end of April 2014. 2. Hypertension. 3. History of red cell microcytosis with a borderline low hemoglobin 10/17/2008. The MCV, hemoglobin, and ferritin were normal on 10/12/2009. The MCV and hemoglobin were normal on 03/01/2014 4. "Shingles" January 2014. She completed a course of valacyclovir.    Disposition:  Yvonne Barron has a remote history of DCIS of the left breast. She remains in clinical remission. I suspect the palpable concern at the right areola is related to irregular fiber glandular breast tissue. I cannot palpate a discrete mass. She  will perform self-examination and contact us if this area changes.  She will continue clinical follow-up with her primary physician. I am available to see her in the future as needed.  Donneta Romberg, MD  07/10/2017  8:50 AM

## 2017-10-07 DIAGNOSIS — R69 Illness, unspecified: Secondary | ICD-10-CM | POA: Diagnosis not present

## 2018-01-12 DIAGNOSIS — R69 Illness, unspecified: Secondary | ICD-10-CM | POA: Diagnosis not present

## 2018-01-19 DIAGNOSIS — B0089 Other herpesviral infection: Secondary | ICD-10-CM | POA: Diagnosis not present

## 2018-02-09 DIAGNOSIS — B349 Viral infection, unspecified: Secondary | ICD-10-CM | POA: Diagnosis not present

## 2018-02-09 DIAGNOSIS — J069 Acute upper respiratory infection, unspecified: Secondary | ICD-10-CM | POA: Diagnosis not present

## 2018-02-24 ENCOUNTER — Other Ambulatory Visit: Payer: Self-pay | Admitting: Obstetrics and Gynecology

## 2018-02-24 DIAGNOSIS — Z1231 Encounter for screening mammogram for malignant neoplasm of breast: Secondary | ICD-10-CM

## 2018-03-09 DIAGNOSIS — E559 Vitamin D deficiency, unspecified: Secondary | ICD-10-CM | POA: Diagnosis not present

## 2018-03-09 DIAGNOSIS — M542 Cervicalgia: Secondary | ICD-10-CM | POA: Diagnosis not present

## 2018-03-09 DIAGNOSIS — Z0000 Encounter for general adult medical examination without abnormal findings: Secondary | ICD-10-CM | POA: Diagnosis not present

## 2018-03-09 DIAGNOSIS — E2839 Other primary ovarian failure: Secondary | ICD-10-CM | POA: Diagnosis not present

## 2018-03-09 DIAGNOSIS — I10 Essential (primary) hypertension: Secondary | ICD-10-CM | POA: Diagnosis not present

## 2018-03-09 DIAGNOSIS — Z23 Encounter for immunization: Secondary | ICD-10-CM | POA: Diagnosis not present

## 2018-03-09 DIAGNOSIS — R7309 Other abnormal glucose: Secondary | ICD-10-CM | POA: Diagnosis not present

## 2018-03-09 DIAGNOSIS — R350 Frequency of micturition: Secondary | ICD-10-CM | POA: Diagnosis not present

## 2018-03-09 LAB — CBC AND DIFFERENTIAL
HEMATOCRIT: 39 (ref 36–46)
Hemoglobin: 12.3 (ref 12.0–16.0)
PLATELETS: 370 (ref 150–399)
WBC: 8.2

## 2018-03-09 LAB — BASIC METABOLIC PANEL
BUN: 10 (ref 4–21)
Creatinine: 0.7 (ref 0.5–1.1)
GLUCOSE: 106
Potassium: 4.1 (ref 3.4–5.3)
Sodium: 141 (ref 137–147)

## 2018-03-09 LAB — HEPATIC FUNCTION PANEL: ALK PHOS: 101 (ref 25–125)

## 2018-03-09 LAB — HEMOGLOBIN A1C: Hemoglobin A1C: 6.6

## 2018-03-09 LAB — VITAMIN D 25 HYDROXY (VIT D DEFICIENCY, FRACTURES): Vit D, 25-Hydroxy: 62.1

## 2018-03-20 ENCOUNTER — Ambulatory Visit: Payer: Medicare PPO

## 2018-03-24 ENCOUNTER — Ambulatory Visit
Admission: RE | Admit: 2018-03-24 | Discharge: 2018-03-24 | Disposition: A | Discharge status: Home / Self Care | Payer: Medicare HMO | Source: Ambulatory Visit | Attending: Obstetrics and Gynecology | Admitting: Obstetrics and Gynecology

## 2018-03-24 DIAGNOSIS — Z1231 Encounter for screening mammogram for malignant neoplasm of breast: Secondary | ICD-10-CM

## 2018-03-27 ENCOUNTER — Other Ambulatory Visit: Payer: Self-pay | Admitting: Nurse Practitioner

## 2018-03-27 DIAGNOSIS — E2839 Other primary ovarian failure: Secondary | ICD-10-CM

## 2018-03-30 ENCOUNTER — Other Ambulatory Visit: Payer: Self-pay | Admitting: Nurse Practitioner

## 2018-03-30 ENCOUNTER — Ambulatory Visit
Admission: RE | Admit: 2018-03-30 | Discharge: 2018-03-30 | Disposition: A | Discharge status: Home / Self Care | Payer: Medicare HMO | Source: Ambulatory Visit | Attending: Nurse Practitioner | Admitting: Nurse Practitioner

## 2018-03-30 DIAGNOSIS — M542 Cervicalgia: Secondary | ICD-10-CM

## 2018-03-30 DIAGNOSIS — S199XXA Unspecified injury of neck, initial encounter: Secondary | ICD-10-CM | POA: Diagnosis not present

## 2018-04-08 DIAGNOSIS — R69 Illness, unspecified: Secondary | ICD-10-CM | POA: Diagnosis not present

## 2018-04-21 ENCOUNTER — Ambulatory Visit: Payer: Self-pay

## 2018-04-28 ENCOUNTER — Inpatient Hospital Stay: Admission: RE | Admit: 2018-04-28 | Payer: Medicare HMO | Source: Ambulatory Visit

## 2018-05-14 DIAGNOSIS — Z1379 Encounter for other screening for genetic and chromosomal anomalies: Secondary | ICD-10-CM | POA: Diagnosis not present

## 2018-05-14 DIAGNOSIS — C50912 Malignant neoplasm of unspecified site of left female breast: Secondary | ICD-10-CM | POA: Diagnosis not present

## 2018-05-14 DIAGNOSIS — Z1371 Encounter for nonprocreative screening for genetic disease carrier status: Secondary | ICD-10-CM | POA: Diagnosis not present

## 2018-05-14 DIAGNOSIS — Z800 Family history of malignant neoplasm of digestive organs: Secondary | ICD-10-CM | POA: Diagnosis not present

## 2018-05-14 DIAGNOSIS — Z801 Family history of malignant neoplasm of trachea, bronchus and lung: Secondary | ICD-10-CM | POA: Diagnosis not present

## 2018-05-14 DIAGNOSIS — Z1501 Genetic susceptibility to malignant neoplasm of breast: Secondary | ICD-10-CM | POA: Diagnosis not present

## 2018-05-14 DIAGNOSIS — Z853 Personal history of malignant neoplasm of breast: Secondary | ICD-10-CM | POA: Diagnosis not present

## 2018-05-18 ENCOUNTER — Ambulatory Visit (INDEPENDENT_AMBULATORY_CARE_PROVIDER_SITE_OTHER): Payer: Medicare HMO

## 2018-05-18 ENCOUNTER — Encounter: Payer: Self-pay | Admitting: Podiatry

## 2018-05-18 ENCOUNTER — Other Ambulatory Visit: Payer: Self-pay | Admitting: Podiatry

## 2018-05-18 ENCOUNTER — Ambulatory Visit: Payer: Medicare HMO | Admitting: Podiatry

## 2018-05-18 VITALS — BP 118/73 | HR 76 | Resp 16

## 2018-05-18 DIAGNOSIS — M205X9 Other deformities of toe(s) (acquired), unspecified foot: Secondary | ICD-10-CM

## 2018-05-18 DIAGNOSIS — C801 Malignant (primary) neoplasm, unspecified: Secondary | ICD-10-CM | POA: Insufficient documentation

## 2018-05-18 DIAGNOSIS — M779 Enthesopathy, unspecified: Secondary | ICD-10-CM

## 2018-05-18 DIAGNOSIS — G629 Polyneuropathy, unspecified: Secondary | ICD-10-CM | POA: Diagnosis not present

## 2018-05-18 DIAGNOSIS — M79672 Pain in left foot: Secondary | ICD-10-CM | POA: Diagnosis not present

## 2018-05-18 DIAGNOSIS — M79671 Pain in right foot: Secondary | ICD-10-CM

## 2018-05-18 NOTE — Progress Notes (Signed)
   Subjective:    Patient ID: Yvonne Barron, female    DOB: 1946/01/12, 72 y.o.   MRN: [id number]  HPI    Review of Systems  All other systems reviewed and are negative.      Objective:   Physical Exam        Assessment & Plan:

## 2018-05-20 NOTE — Progress Notes (Signed)
Subjective:   Patient ID: Yvonne Barron, female   DOB: 72 y.o.   MRN: [id number]   HPI Patient presents stating she has a lumpy feeling in the bottom of her feet and while it is not bothersome she is concerned about it and at times she feels like it affects her walking   Review of Systems  All other systems reviewed and are negative.       Objective:  Physical Exam  Constitutional: She appears well-developed and well-nourished.  Cardiovascular: Intact distal pulses.  Pulmonary/Chest: Effort normal.  Musculoskeletal: Normal range of motion.  Neurological: She is alert.  Skin: Skin is warm.  Nursing note and vitals reviewed.   Neurovascular status intact muscle strength adequate range of motion within normal limits with patient found to have mild inflammation plantar aspect both feet with possible minimal nerve damage and diminished sharp dull vibratory.  Patient has moderate structural bunion deformity bilateral     Assessment:  This may be related to a low-grade neuropathy or an issue with back compression or it may be related to inflammation or other condition     Plan:  H&P condition reviewed and I placed on oral anti-inflammatory diclofenac advised on thick bottom shoes and also a vitamin complex to try to stop any form of neuropathy.  If symptoms persist or get worse she will be seen back  X-ray was negative for signs of extended pathology besides some moderate structural bunion deformity

## 2018-06-05 ENCOUNTER — Ambulatory Visit
Admission: RE | Admit: 2018-06-05 | Discharge: 2018-06-05 | Disposition: A | Discharge status: Home / Self Care | Payer: Medicare HMO | Source: Ambulatory Visit | Attending: Nurse Practitioner | Admitting: Nurse Practitioner

## 2018-06-05 DIAGNOSIS — Z13820 Encounter for screening for osteoporosis: Secondary | ICD-10-CM | POA: Diagnosis not present

## 2018-06-05 DIAGNOSIS — E2839 Other primary ovarian failure: Secondary | ICD-10-CM

## 2018-06-05 DIAGNOSIS — Z780 Asymptomatic menopausal state: Secondary | ICD-10-CM | POA: Diagnosis not present

## 2018-06-20 DIAGNOSIS — J019 Acute sinusitis, unspecified: Secondary | ICD-10-CM | POA: Diagnosis not present

## 2018-06-20 DIAGNOSIS — R05 Cough: Secondary | ICD-10-CM | POA: Diagnosis not present

## 2018-06-20 DIAGNOSIS — H109 Unspecified conjunctivitis: Secondary | ICD-10-CM | POA: Diagnosis not present

## 2018-06-20 DIAGNOSIS — B9689 Other specified bacterial agents as the cause of diseases classified elsewhere: Secondary | ICD-10-CM | POA: Diagnosis not present

## 2018-06-27 DIAGNOSIS — S149XXA Injury of unspecified nerves of neck, initial encounter: Secondary | ICD-10-CM | POA: Diagnosis not present

## 2018-08-29 ENCOUNTER — Encounter: Payer: Self-pay | Admitting: Nurse Practitioner

## 2018-08-29 DIAGNOSIS — E559 Vitamin D deficiency, unspecified: Secondary | ICD-10-CM | POA: Insufficient documentation

## 2018-08-29 DIAGNOSIS — M542 Cervicalgia: Secondary | ICD-10-CM | POA: Insufficient documentation

## 2018-08-29 DIAGNOSIS — E2839 Other primary ovarian failure: Secondary | ICD-10-CM | POA: Insufficient documentation

## 2018-08-29 DIAGNOSIS — R350 Frequency of micturition: Secondary | ICD-10-CM | POA: Insufficient documentation

## 2018-09-14 ENCOUNTER — Encounter: Payer: Self-pay | Admitting: Nurse Practitioner

## 2018-09-14 ENCOUNTER — Ambulatory Visit (INDEPENDENT_AMBULATORY_CARE_PROVIDER_SITE_OTHER): Payer: Medicare HMO | Admitting: Nurse Practitioner

## 2018-09-14 VITALS — BP 118/80 | HR 62 | Temp 97.5°F | Ht 62.25 in | Wt 158.2 lb

## 2018-09-14 DIAGNOSIS — R059 Cough, unspecified: Secondary | ICD-10-CM

## 2018-09-14 DIAGNOSIS — R7309 Other abnormal glucose: Secondary | ICD-10-CM | POA: Insufficient documentation

## 2018-09-14 DIAGNOSIS — I10 Essential (primary) hypertension: Secondary | ICD-10-CM | POA: Diagnosis not present

## 2018-09-14 DIAGNOSIS — R05 Cough: Secondary | ICD-10-CM

## 2018-09-14 DIAGNOSIS — R69 Illness, unspecified: Secondary | ICD-10-CM | POA: Diagnosis not present

## 2018-09-14 MED ORDER — GLUCOSE BLOOD VI STRP: ORAL_STRIP | 3 refills | Status: DC

## 2018-09-14 NOTE — Patient Instructions (Signed)
Kegel Exercises Kegel exercises help strengthen the muscles that support the rectum, vagina, small intestine, bladder, and uterus. Doing Kegel exercises can help:  Improve bladder and bowel control.  Improve sexual response.  Reduce problems and discomfort during pregnancy.  Kegel exercises involve squeezing your pelvic floor muscles, which are the same muscles you squeeze when you try to stop the flow of urine. The exercises can be done while sitting, standing, or lying down, but it is best to vary your position. Phase 1 exercises 1. Squeeze your pelvic floor muscles tight. You should feel a tight lift in your rectal area. If you are a female, you should also feel a tightness in your vaginal area. Keep your stomach, buttocks, and legs relaxed. 2. Hold the muscles tight for up to 10 seconds. 3. Relax your muscles. Repeat this exercise 50 times a day or as many times as told by your health care provider. Continue to do this exercise for at least 4-6 weeks or for as long as told by your health care provider. This information is not intended to replace advice given to you by your health care provider. Make sure you discuss any questions you have with your health care provider. Document Released: 11/11/2012 Document Revised: 07/20/2016 Document Reviewed: 10/15/2015 Elsevier Interactive Patient Education  2018 [place] Prediabetes-also called impaired glucose tolerance or impaired fasting glucose-is a condition that causes blood sugar (blood glucose) levels to be higher than normal. Following a healthy diet can help to keep prediabetes under control. It can also help to lower the risk of type 2 diabetes and heart disease, which are increased in people who have prediabetes. Along with regular exercise, a healthy diet:  Promotes weight loss.  Helps to control blood sugar levels.  Helps to improve the way that the body uses insulin.  What do I need to know about this  eating plan?  Use the glycemic index (GI) to plan your meals. The index tells you how quickly a food will raise your blood sugar. Choose low-GI foods. These foods take a longer time to raise blood sugar.  Pay close attention to the amount of carbohydrates in the food that you eat. Carbohydrates increase blood sugar levels.  Keep track of how many calories you take in. Eating the right amount of calories will help you to achieve a healthy weight. Losing about 7 percent of your starting weight can help to prevent type 2 diabetes.  You may want to follow a Mediterranean diet. This diet includes a lot of vegetables, lean meats or fish, whole grains, fruits, and healthy oils and fats. What foods can I eat? Grains Whole grains, such as whole-wheat or whole-grain breads, crackers, cereals, and pasta. Unsweetened oatmeal. Bulgur. Barley. Quinoa. Brown rice. Corn or whole-wheat flour tortillas or taco shells. Vegetables Lettuce. Spinach. Peas. Beets. Cauliflower. Cabbage. Broccoli. Carrots. Tomatoes. Squash. Eggplant. Herbs. Peppers. Onions. Cucumbers. Brussels sprouts. Fruits Berries. Bananas. Apples. Oranges. Grapes. Papaya. Mango. Pomegranate. Kiwi. Grapefruit. Cherries. Meats and Other Protein Sources Seafood. Lean meats, such as chicken and [place] or lean cuts of pork and beef. Tofu. Eggs. Nuts. Beans. Dairy Low-fat or fat-free dairy products, such as yogurt, cottage cheese, and cheese. Beverages Water. Tea. Coffee. Sugar-free or diet soda. Seltzer water. Milk. Milk alternatives, such as soy or almond milk. Condiments Mustard. Relish. Low-fat, low-sugar ketchup. Low-fat, low-sugar barbecue sauce. Low-fat or fat-free mayonnaise. Sweets and Desserts Sugar-free or low-fat pudding. Sugar-free or low-fat ice cream and other frozen treats. Fats and  Oils Avocado. Walnuts. Olive oil. The items listed above may not be a complete list of recommended foods or beverages. Contact your dietitian for more  options. What foods are not recommended? Grains Refined white flour and flour products, such as bread, pasta, snack foods, and cereals. Beverages Sweetened drinks, such as sweet iced tea and soda. Sweets and Desserts Baked goods, such as cake, cupcakes, pastries, cookies, and cheesecake. The items listed above may not be a complete list of foods and beverages to avoid. Contact your dietitian for more information. This information is not intended to replace advice given to you by your health care provider. Make sure you discuss any questions you have with your health care provider. Document Released: 04/11/2015 Document Revised: 05/02/2016 Document Reviewed: 12/21/2014 Elsevier Interactive Patient Education  2017 [employer].

## 2018-09-14 NOTE — Progress Notes (Signed)
Subjective:     Patient ID: Yvonne Barron , female    DOB: 1946/02/18 , 72 y.o.   MRN: [id number]   Hypertension  This is a chronic problem. The current episode started more than 1 year ago. The problem is unchanged. The problem is controlled. Pertinent negatives include no chest pain. There are no associated agents to hypertension. Risk factors: Prediabetes - regular exercising, diabetic education. Once per week for 6 months. - started 1 week ago.   Compliance problems include exercise and diet.  There is no history of kidney disease.  Cough  This is a new problem. The current episode started 1 to 4 weeks ago. The problem has been waxing and waning. The cough is non-productive. Pertinent negatives include no chest pain, ear pain or fever.     Past Medical History:  Diagnosis Date  . History of breast cancer 2009--  INVASIVE DUCTAL IN SITU--  S/P MASTECTOMY   NO RECURRENCE  . Hypertension   . Urethral caruncle       Current Outpatient Medications:  .  benzonatate (TESSALON) 200 MG capsule, Take 200 mg by mouth as needed for cough., Disp: , Rfl:  .  calcium carbonate (OS-CAL - DOSED IN MG OF ELEMENTAL CALCIUM) 1250 (500 Ca) MG tablet, Take 1 tablet by mouth., Disp: , Rfl:  .  cholecalciferol (VITAMIN D) 1000 units tablet, Take 1,000 Units by mouth 2 (two) times daily., Disp: , Rfl:  .  hydrochlorothiazide 25 MG tablet, Take 25 mg by mouth daily. , Disp: , Rfl:  .  valACYclovir (VALTREX) 1000 MG tablet, Take 1,000 mg by mouth as needed., Disp: , Rfl:  .  vitamin C (ASCORBIC ACID) 500 MG tablet, Take 500 mg by mouth daily., Disp: , Rfl:  .  glucose blood (ONETOUCH VERIO) test strip, Check blood sugar once per day Dx code: R73.09, Disp: 100 each, Rfl: 3 .  HYDROcodone-acetaminophen (NORCO) 5-325 MG tablet, Take 1-2 tablets by mouth every 6 (six) hours as needed. (Patient not taking: Reported on 09/14/2018), Disp: 30 tablet, Rfl: 0 .  omeprazole (PRILOSEC) 20 MG capsule, Take 20 mg by mouth  daily., Disp: , Rfl:    Review of Systems  Constitutional: Negative.  Negative for fever.  HENT: Negative for ear pain.   Respiratory: Positive for cough.   Cardiovascular: Negative.  Negative for chest pain.  Skin: Negative.   Neurological: Negative.   Hematological: Negative.      Today's Vitals   09/14/18 1040  BP: 118/80  Pulse: 62  Temp: (!) 97.5 F (36.4 C)  TempSrc: Oral  SpO2: 97%  Weight: 158 lb 3.2 oz (71.8 kg)  Height: 5' 2.25" (1.581 m)   Body mass index is 28.7 kg/m.   Objective:  Physical Exam  Constitutional: She appears well-developed and well-nourished.  Cardiovascular: Normal rate, regular rhythm, normal heart sounds and intact distal pulses.  Pulmonary/Chest: Effort normal and breath sounds normal.  Skin: Skin is warm and dry.  Psychiatric: She has a normal mood and affect.        Assessment And Plan:     1. Essential hypertension  Chronic. Good control.   Continue with current medications  Assisted patient with setup with MyChart  2. Abnormal glucose  Chronic, no current medications  Continue with your prediabetes program at [facility]. [place]  Return in 3 months to recheck  Advised to limit intake of sugary foods and drinks.    3. Cough  Likely related to change in weather  and rhinitis  Sent Rx for Tessalon perles  Minette Brine, FNP

## 2018-09-15 LAB — HEMOGLOBIN A1C
Est. average glucose Bld gHb Est-mCnc: 146 mg/dL
Hgb A1c MFr Bld: 6.7 % — ABNORMAL HIGH (ref 4.8–5.6)

## 2018-09-15 LAB — BMP8+EGFR
BUN / CREAT RATIO: 20 (ref 12–28)
BUN: 13 mg/dL (ref 8–27)
CALCIUM: 10.1 mg/dL (ref 8.7–10.3)
CO2: 25 mmol/L (ref 20–29)
CREATININE: 0.65 mg/dL (ref 0.57–1.00)
Chloride: 100 mmol/L (ref 96–106)
GFR calc Af Amer: 104 mL/min/{1.73_m2} (ref >59)
GFR calc non Af Amer: 90 mL/min/{1.73_m2} (ref >59)
GLUCOSE: 94 mg/dL (ref 65–99)
Potassium: 4.2 mmol/L (ref 3.5–5.2)
Sodium: 144 mmol/L (ref 134–144)

## 2018-10-28 ENCOUNTER — Encounter: Payer: Self-pay | Admitting: Podiatry

## 2018-11-13 DIAGNOSIS — M25561 Pain in right knee: Secondary | ICD-10-CM | POA: Insufficient documentation

## 2018-11-13 DIAGNOSIS — Z9889 Other specified postprocedural states: Secondary | ICD-10-CM | POA: Insufficient documentation

## 2018-11-19 DIAGNOSIS — M25562 Pain in left knee: Secondary | ICD-10-CM | POA: Diagnosis not present

## 2018-12-16 ENCOUNTER — Ambulatory Visit (INDEPENDENT_AMBULATORY_CARE_PROVIDER_SITE_OTHER): Payer: Medicare HMO | Admitting: Nurse Practitioner

## 2018-12-16 ENCOUNTER — Encounter: Payer: Self-pay | Admitting: Nurse Practitioner

## 2018-12-16 VITALS — BP 112/78 | HR 68 | Temp 97.4°F | Ht 62.25 in | Wt 156.6 lb

## 2018-12-16 DIAGNOSIS — I10 Essential (primary) hypertension: Secondary | ICD-10-CM

## 2018-12-16 DIAGNOSIS — R7309 Other abnormal glucose: Secondary | ICD-10-CM

## 2018-12-16 LAB — BMP8+EGFR
BUN/Creatinine Ratio: 29 — ABNORMAL HIGH (ref 12–28)
BUN: 20 mg/dL (ref 8–27)
CALCIUM: 10.2 mg/dL (ref 8.7–10.3)
CO2: 26 mmol/L (ref 20–29)
CREATININE: 0.69 mg/dL (ref 0.57–1.00)
Chloride: 100 mmol/L (ref 96–106)
GFR calc Af Amer: 102 mL/min/{1.73_m2} (ref >59)
GFR, EST NON AFRICAN AMERICAN: 88 mL/min/{1.73_m2} (ref >59)
Glucose: 105 mg/dL — ABNORMAL HIGH (ref 65–99)
POTASSIUM: 3.9 mmol/L (ref 3.5–5.2)
Sodium: 140 mmol/L (ref 134–144)

## 2018-12-16 LAB — HEMOGLOBIN A1C
ESTIMATED AVERAGE GLUCOSE: 151 mg/dL
HEMOGLOBIN A1C: 6.9 % — AB (ref 4.8–5.6)

## 2018-12-16 NOTE — Progress Notes (Signed)
Subjective:     Patient ID: Yvonne Barron , female    DOB: January 16, 1946 , 73 y.o.   MRN: [id number]   Chief Complaint  Patient presents with  . Prediabetes    HPI  Prediabetes - doing well with her medications.  Occasionally checks her blood sugar avg 125.  Exercising regularly.  She has been going to the dietitian.      Past Medical History:  Diagnosis Date  . History of breast cancer 2009--  INVASIVE DUCTAL IN SITU--  S/P MASTECTOMY   NO RECURRENCE  . Hypertension   . Urethral caruncle      Family History  Problem Relation Age of Onset  . Diabetes Mother   . Diabetes Father   . Kidney disease Father   . Diabetes Brother      Current Outpatient Medications:  .  benzonatate (TESSALON) 200 MG capsule, Take 200 mg by mouth as needed for cough., Disp: , Rfl:  .  calcium carbonate (OS-CAL - DOSED IN MG OF ELEMENTAL CALCIUM) 1250 (500 Ca) MG tablet, Take 1 tablet by mouth., Disp: , Rfl:  .  cholecalciferol (VITAMIN D) 1000 units tablet, Take 1,000 Units by mouth 2 (two) times daily., Disp: , Rfl:  .  glucose blood (ONETOUCH VERIO) test strip, Check blood sugar once per day Dx code: R73.09, Disp: 100 each, Rfl: 3 .  hydrochlorothiazide 25 MG tablet, Take 25 mg by mouth daily. , Disp: , Rfl:  .  valACYclovir (VALTREX) 1000 MG tablet, Take 1,000 mg by mouth as needed., Disp: , Rfl:  .  vitamin C (ASCORBIC ACID) 500 MG tablet, Take 500 mg by mouth daily., Disp: , Rfl:    No Known Allergies   Review of Systems  Constitutional: Negative.  Negative for fatigue.  Respiratory: Negative.   Cardiovascular: Negative.  Negative for chest pain, palpitations and leg swelling.  Gastrointestinal: Negative.   Endocrine: Negative.  Negative for polydipsia, polyphagia and polyuria.  Musculoskeletal:       Feet feel lumpy.    Skin: Negative.   Neurological: Negative.  Negative for dizziness.  Psychiatric/Behavioral: Negative for confusion. The patient is not nervous/anxious.       Today's Vitals   12/16/18 1025  BP: 112/78  Pulse: 68  Temp: (!) 97.4 F (36.3 C)  TempSrc: Oral  SpO2: 97%  Weight: 156 lb 9.6 oz (71 kg)  Height: 5' 2.25" (1.581 m)  PainSc: 0-No pain   Body mass index is 28.41 kg/m.   Objective:  Physical Exam Constitutional:      Appearance: She is well-developed.  Neck:     Musculoskeletal: Normal range of motion and neck supple.  Cardiovascular:     Rate and Rhythm: Normal rate and regular rhythm.     Heart sounds: Normal heart sounds. No murmur.  Pulmonary:     Effort: Pulmonary effort is normal.     Breath sounds: Normal breath sounds.  Chest:     Chest wall: No tenderness.  Musculoskeletal: Normal range of motion.  Skin:    General: Skin is warm and dry.     Capillary Refill: Capillary refill takes less than 2 seconds.  Neurological:     General: No focal deficit present.     Mental Status: She is alert and oriented to person, place, and time.     Motor: No weakness.         Assessment And Plan:     1. Essential hypertension . B/P is well controlled.  Marland Kitchen  CMP ordered to check renal function.  . The importance of regular exercise and dietary modification was stressed to the patient.  . Stressed importance of losing ten percent of her body weight to help with B/P control.  . The weight loss would help with decreasing cardiac and cancer risk as well.   2. Abnormal glucose  Chronic, controlled  no current medications  Encouraged to limit intake of sugary foods and drinks  Encouraged to increase physical activity to 150 minutes per week     Minette Brine, FNP

## 2018-12-16 NOTE — Patient Instructions (Signed)
Prediabetes Prediabetes is the condition of having a blood sugar (blood glucose) level that is higher than it should be, but not high enough for you to be diagnosed with type 2 diabetes. Having prediabetes puts you at risk for developing type 2 diabetes (type 2 diabetes mellitus). Prediabetes may be called impaired glucose tolerance or impaired fasting glucose. Prediabetes usually does not cause symptoms. Your health care provider can diagnose this condition with blood tests. You may be tested for prediabetes if you are overweight and if you have at least one other risk factor for prediabetes. What is blood glucose, and how is it measured? Blood glucose refers to the amount of glucose in your bloodstream. Glucose comes from eating foods that contain sugars and starches (carbohydrates), which the body breaks down into glucose. Your blood glucose level may be measured in mg/dL (milligrams per deciliter) or mmol/L (millimoles per liter). Your blood glucose may be checked with one or more of the following blood tests:  A fasting blood glucose (FBG) test. You will not be allowed to eat (you will fast) for 8 hours or longer before a blood sample is taken. ? A normal range for FBG is 70-100 mg/dl (3.9-5.6 mmol/L).  An A1c (hemoglobin A1c) blood test. This test provides information about blood glucose control over the previous 2?3months.  An oral glucose tolerance test (OGTT). This test measures your blood glucose at two times: ? After fasting. This is your baseline level. ? Two hours after you drink a beverage that contains glucose. You may be diagnosed with prediabetes:  If your FBG is 100?125 mg/dL (5.6-6.9 mmol/L).  If your A1c level is 5.7?6.4%.  If your OGTT result is 140?199 mg/dL (7.8-11 mmol/L). These blood tests may be repeated to confirm your diagnosis. How can this condition affect me? The pancreas produces a hormone (insulin) that helps to move glucose from the bloodstream into cells.  When cells in the body do not respond properly to insulin that the body makes (insulin resistance), excess glucose builds up in the blood instead of going into cells. As a result, high blood glucose (hyperglycemia) can develop, which can cause many complications. Hyperglycemia is a symptom of prediabetes. Having high blood glucose for a long time is dangerous. Too much glucose in your blood can damage your nerves and blood vessels. Long-term damage can lead to complications from diabetes, which may include:  Heart disease.  Stroke.  Blindness.  Kidney disease.  Depression.  Poor circulation in the feet and legs, which could lead to surgical removal (amputation) in severe cases. What can increase my risk? Risk factors for prediabetes include:  Having a family member with type 2 diabetes.  Being overweight or obese.  Being older than age 45.  Being of American Indian, African-American, Hispanic/Latino, or Asian/Pacific Islander descent.  Having an inactive (sedentary) lifestyle.  Having a history of heart disease.  History of gestational diabetes or polycystic ovary syndrome (PCOS), in women.  Having low levels of good cholesterol (HDL-C) or high levels of blood fats (triglycerides).  Having high blood pressure. What actions can I take to prevent diabetes?      Be physically active. ? Do moderate-intensity physical activity for 30 or more minutes on 5 or more days of the week, or as much as told by your health care provider. This could be brisk walking, biking, or water aerobics. ? Ask your health care provider what activities are safe for you. A mix of physical activities may be best, such as   or as much as told by your health care provider. This could be brisk walking, biking, or water aerobics.  ? Ask your health care provider what activities are safe for you. A mix of physical activities may be best, such as walking, swimming, cycling, and strength training.  · Lose weight as told by your health care provider.  ? Losing 5-7% of your body weight can reverse insulin resistance.  ? Your health care provider can determine how much weight loss is best for you and can help you lose weight  safely.  · Follow a healthy meal plan. This includes eating lean proteins, complex carbohydrates, fresh fruits and vegetables, low-fat dairy products, and healthy fats.  ? Follow instructions from your health care provider about eating or drinking restrictions.  ? Make an appointment to see a diet and nutrition specialist (registered dietitian) to help you create a healthy eating plan that is right for you.  · Do not smoke or use any tobacco products, such as cigarettes, chewing tobacco, and e-cigarettes. If you need help quitting, ask your health care provider.  · Take over-the-counter and prescription medicines as told by your health care provider. You may be prescribed medicines that help lower the risk of type 2 diabetes.  · Keep all follow-up visits as told by your health care provider. This is important.  Summary  · Prediabetes is the condition of having a blood sugar (blood glucose) level that is higher than it should be, but not high enough for you to be diagnosed with type 2 diabetes.  · Having prediabetes puts you at risk for developing type 2 diabetes (type 2 diabetes mellitus).  · To help prevent type 2 diabetes, make lifestyle changes such as being physically active and eating a healthy diet. Lose weight as told by your health care provider.  This information is not intended to replace advice given to you by your health care provider. Make sure you discuss any questions you have with your health care provider.  Document Released: 03/18/2016 Document Revised: 07/15/2017 Document Reviewed: 01/16/2016  Elsevier Interactive Patient Education © 2019 Elsevier Inc.

## 2018-12-18 DIAGNOSIS — B0089 Other herpesviral infection: Secondary | ICD-10-CM | POA: Diagnosis not present

## 2018-12-27 ENCOUNTER — Other Ambulatory Visit: Payer: Self-pay | Admitting: Nurse Practitioner

## 2018-12-29 ENCOUNTER — Other Ambulatory Visit: Payer: Self-pay

## 2018-12-29 MED ORDER — HYDROCHLOROTHIAZIDE 25 MG PO TABS: 25.0000 mg | ORAL_TABLET | Freq: Every day | ORAL | 1 refills | Status: DC

## 2019-01-03 ENCOUNTER — Encounter: Payer: Self-pay | Admitting: Nurse Practitioner

## 2019-01-19 DIAGNOSIS — Z1211 Encounter for screening for malignant neoplasm of colon: Secondary | ICD-10-CM | POA: Diagnosis not present

## 2019-01-19 LAB — HM COLONOSCOPY

## 2019-02-02 ENCOUNTER — Encounter: Payer: Self-pay | Admitting: Nurse Practitioner

## 2019-02-17 ENCOUNTER — Encounter: Payer: Self-pay | Admitting: Nurse Practitioner

## 2019-03-11 ENCOUNTER — Other Ambulatory Visit: Payer: Self-pay

## 2019-03-11 ENCOUNTER — Ambulatory Visit (INDEPENDENT_AMBULATORY_CARE_PROVIDER_SITE_OTHER): Payer: Medicare HMO

## 2019-03-11 VITALS — BP 114/76 | HR 80 | Temp 98.3°F | Ht 63.0 in | Wt 154.6 lb

## 2019-03-11 DIAGNOSIS — Z0000 Encounter for general adult medical examination without abnormal findings: Secondary | ICD-10-CM | POA: Diagnosis not present

## 2019-03-11 DIAGNOSIS — I10 Essential (primary) hypertension: Secondary | ICD-10-CM

## 2019-03-11 LAB — POCT URINALYSIS DIPSTICK
Bilirubin, UA: NEGATIVE
Glucose, UA: NEGATIVE
Ketones, UA: NEGATIVE
Nitrite, UA: NEGATIVE
Protein, UA: NEGATIVE
Spec Grav, UA: 1.01 (ref 1.010–1.025)
Urobilinogen, UA: 0.2 E.U./dL
pH, UA: 5.5 (ref 5.0–8.0)

## 2019-03-11 NOTE — Progress Notes (Signed)
Subjective:   Yvonne Barron is a 73 y.o. female who presents for Medicare Annual (Subsequent) preventive examination.  Review of Systems:  N/a  Cardiac Risk Factors include: advanced age (>22men, >36 women);hypertension     Objective:     Vitals: BP 114/76 (BP Location: Right Arm, Patient Position: Sitting)   Pulse 80   Temp 98.3 F (36.8 C) (Oral)   Ht 5\' 3"  (1.6 m)   Wt 154 lb 9.6 oz (70.1 kg)   SpO2 99%   BMI 27.39 kg/m   Body mass index is 27.39 kg/m.  Advanced Directives 03/11/2019 11/28/2016 11/26/2016 11/26/2016 02/26/2016 02/27/2015 07/10/2012  Does Patient Have a Medical Advance Directive? No Yes Yes Yes Yes No Patient has advance directive, copy not in chart  Type of Advance Directive - [place];Living will - [place];Living will [place];Living will - Living will  Does patient want to make changes to medical advance directive? - No - Patient declined - - - - -  Copy of [place] in Chart? - No - copy requested - - No - copy requested - (No Data)  Would patient like information on creating a medical advance directive? Yes (MAU/Ambulatory/Procedural Areas - Information given) - - - - Yes - Educational materials given -  Pre-existing out of facility DNR order (yellow form or pink MOST form) - - - - - - No    Tobacco Social History   Tobacco Use  Smoking Status Former Smoker  . Packs/Barron: 2.00  . Years: 20.00  . Pack years: 40.00  . Last attempt to quit: 01/19/1987  . Years since quitting: 32.1  Smokeless Tobacco Never Used     Counseling given: Not Answered   Clinical Intake:  Pre-visit preparation completed: Yes  Pain : No/denies pain Pain Score: 0-No pain     Nutritional Status: BMI 25 -29 Overweight Nutritional Risks: None Diabetes: No  How often do you need to have someone help you when you read instructions, pamphlets, or other written materials from your doctor or  pharmacy?: 1 - Never What is the last grade level you completed in school?: some college  Interpreter Needed?: No  Information entered by :: NAllen LPN  Past Medical History:  Diagnosis Date  . History of breast cancer 2009--  INVASIVE DUCTAL IN SITU--  S/P MASTECTOMY   NO RECURRENCE  . Hypertension   . Urethral caruncle    Past Surgical History:  Procedure Laterality Date  . CYSTOSCOPY WITH URETHRAL CARUNCLE  07/10/2012   Procedure: CYSTOSCOPY WITH URETHRAL CARUNCLE;  Surgeon: Claybon Jabs, MD;  Location[facility];  Service: Urology;  Laterality: N/A;    EXCISION OF CARUNCLE   . LEFT TOTAL MASTECTOMY/ LEFT AXILLARY SENTINEL LYMPH NODE BX  03-01-2008   INVASIVE  DUCTAL CARCINOMA IN SITU  . LEFT WRIST SURG  2008   REPAIR NERVE  . MASTECTOMY Left 2009  . VAGINAL HYSTERECTOMY  1998   W/ BILATERAL SALPINGOOPHECTOMY   Family History  Problem Relation Age of Onset  . Diabetes Mother   . Diabetes Father   . Kidney disease Father   . Diabetes Brother    Social History   Socioeconomic History  . Marital status: Divorced    Spouse name: Not on file  . Number of children: Not on file  . Years of education: Not on file  . Highest education level: Not on file  Occupational History  .  Occupation: semi- retired  Scientific laboratory technician  . Financial resource strain: Not hard at all  . Food insecurity:    Worry: Never true    Inability: Never true  . Transportation needs:    Medical: No    Non-medical: No  Tobacco Use  . Smoking status: Former Smoker    Packs/Barron: 2.00    Years: 20.00    Pack years: 40.00    Last attempt to quit: 01/19/1987    Years since quitting: 32.1  . Smokeless tobacco: Never Used  Substance and Sexual Activity  . Alcohol use: No  . Drug use: No  . Sexual activity: Not Currently  Lifestyle  . Physical activity:    Days per week: 3 days    Minutes per session: 40 min  . Stress: Not at all  Relationships  . Social connections:    Talks  on phone: Not on file    Gets together: Not on file    Attends religious service: Not on file    Active member of club or organization: Not on file    Attends meetings of clubs or organizations: Not on file    Relationship status: Not on file  Other Topics Concern  . Not on file  Social History Narrative  . Not on file    Outpatient Encounter Medications as of 03/11/2019  Medication Sig  . benzonatate (TESSALON) 200 MG capsule Take 200 mg by mouth as needed for cough.  . calcium carbonate (OS-CAL - DOSED IN MG OF ELEMENTAL CALCIUM) 1250 (500 Ca) MG tablet Take 1 tablet by mouth.  . cholecalciferol (VITAMIN D) 1000 units tablet Take 1,000 Units by mouth 2 (two) times daily.  Marland Kitchen glucose blood (ONETOUCH VERIO) test strip Check blood sugar once per Barron Dx code: R73.09  . hydrochlorothiazide (HYDRODIURIL) 25 MG tablet Take 1 tablet (25 mg total) by mouth daily.  . valACYclovir (VALTREX) 1000 MG tablet Take 1,000 mg by mouth as needed.  . vitamin C (ASCORBIC ACID) 500 MG tablet Take 500 mg by mouth daily.   No facility-administered encounter medications on file as of 03/11/2019.     Activities of Daily Living In your present state of health, do you have any difficulty performing the following activities: 03/11/2019  Hearing? N  Vision? N  Difficulty concentrating or making decisions? N  Walking or climbing stairs? N  Dressing or bathing? N  Doing errands, shopping? N  Preparing Food and eating ? N  Using the Toilet? N  In the past six months, have you accidently leaked urine? N  Do you have problems with loss of bowel control? N  Managing your Medications? N  Managing your Finances? N  Housekeeping or managing your Housekeeping? N  Some recent data might be hidden    Patient Care Team: Minette Brine, FNP as PCP - General (General Practice)    Assessment:   This is a routine wellness examination for Yvonne Barron.  Exercise Activities and Dietary recommendations Current Exercise Habits:  Structured exercise class, Type of exercise: calisthenics;strength training/weights, Time (Minutes): 45, Frequency (Times/Week): 3, Weekly Exercise (Minutes/Week): 135, Intensity: Mild  Goals    . HEMOGLOBIN A1C < 7.0     Healthy diet and lose 10 pounds       Fall Risk Fall Risk  03/11/2019 12/16/2018 09/14/2018  Falls in the past year? - 0 No  Risk for fall due to : Medication side effect - -  Follow up Education provided;Falls prevention discussed - -  Is the patient's home free of loose throw rugs in walkways, pet beds, electrical cords, etc?   yes      Grab bars in the bathroom? yes      Handrails on the stairs? n/a      Adequate lighting?   yes  Timed Get Up and Go performed: n/a  Depression Screen PHQ 2/9 Scores 03/11/2019 12/16/2018 09/14/2018  PHQ - 2 Score 0 0 0  PHQ- 9 Score 0 - -     Cognitive Function     6CIT Screen 03/11/2019  What Year? 0 points  What month? 0 points  What time? 0 points  Count back from 20 0 points  Months in reverse 0 points  Repeat phrase 0 points  Total Score 0    Immunization History  Administered Date(s) Administered  . Influenza-Unspecified 09/23/2018  . Tdap 09/27/2012    Qualifies for Shingles Vaccine? yes  Screening Tests Health Maintenance  Topic Date Due  . PNA vac Low Risk Adult (1 of 2 - PCV13) 03/10/2020 (Originally 04/01/2013)  . INFLUENZA VACCINE  07/10/2019  . MAMMOGRAM  03/24/2020  . TETANUS/TDAP  09/27/2022  . COLONOSCOPY  01/19/2029  . DEXA SCAN  Completed  . Hepatitis C Screening  Completed    Cancer Screenings: Lung: Low Dose CT Chest recommended if Age 58-80 years, 30 pack-year currently smoking OR have quit w/in 15years. Patient does not qualify. Breast:  Up to date on Mammogram? Yes   Up to date of Bone Density/Dexa? Yes Colorectal: up to date  Additional Screenings: : Hepatitis C Screening: 05/30/2014 <0.1     Plan:   Patient wants to keep A1C under control and lose 10 pounds  I have personally  reviewed and noted the following in the patient's chart:   . Medical and social history . Use of alcohol, tobacco or illicit drugs  . Current medications and supplements . Functional ability and status . Nutritional status . Physical activity . Advanced directives . List of other physicians . Hospitalizations, surgeries, and ER visits in previous 12 months . Vitals . Screenings to include cognitive, depression, and falls . Referrals and appointments  In addition, I have reviewed and discussed with patient certain preventive protocols, quality metrics, and best practice recommendations. A written personalized care plan for preventive services as well as general preventive health recommendations were provided to patient.     Kellie Simmering, LPN  04/09/7615

## 2019-03-11 NOTE — Patient Instructions (Signed)
Yvonne Barron , Thank you for taking time to come for your Medicare Wellness Visit. I appreciate your ongoing commitment to your health goals. Please review the following plan we discussed and let me know if I can assist you in the future.   Screening recommendations/referrals: Colonoscopy: 01/2019 Mammogram: 03/2018 Bone Density: 05/2018 Recommended yearly ophthalmology/optometry visit for glaucoma screening and checkup Recommended yearly dental visit for hygiene and checkup  Vaccinations: Influenza vaccine: 09/2018 Pneumococcal vaccine: 03/2018 Tdap vaccine: 01/2014 Shingles vaccine: discussed    Advanced directives: Advance directive discussed with you today. I have provided a copy for you to complete at home and have notarized. Once this is complete please bring a copy in to our office so we can scan it into your chart.   Conditions/risks identified: Overweight  Next appointment: 04/16/2019 at 2:15p   Preventive Care 4 Years and Older, Female Preventive care refers to lifestyle choices and visits with your health care provider that can promote health and wellness. What does preventive care include?  A yearly physical exam. This is also called an annual well check.  Dental exams once or twice a year.  Routine eye exams. Ask your health care provider how often you should have your eyes checked.  Personal lifestyle choices, including:  Daily care of your teeth and gums.  Regular physical activity.  Eating a healthy diet.  Avoiding tobacco and drug use.  Limiting alcohol use.  Practicing safe sex.  Taking low-dose aspirin every day.  Taking vitamin and mineral supplements as recommended by your health care provider. What happens during an annual well check? The services and screenings done by your health care provider during your annual well check will depend on your age, overall health, lifestyle risk factors, and family history of disease. Counseling  Your health care  provider may ask you questions about your:  Alcohol use.  Tobacco use.  Drug use.  Emotional well-being.  Home and relationship well-being.  Sexual activity.  Eating habits.  History of falls.  Memory and ability to understand (cognition).  Work and work Statistician.  Reproductive health. Screening  You may have the following tests or measurements:  Height, weight, and BMI.  Blood pressure.  Lipid and cholesterol levels. These may be checked every 5 years, or more frequently if you are over 20 years old.  Skin check.  Lung cancer screening. You may have this screening every year starting at age 43 if you have a 30-pack-year history of smoking and currently smoke or have quit within the past 15 years.  Fecal occult blood test (FOBT) of the stool. You may have this test every year starting at age 53.  Flexible sigmoidoscopy or colonoscopy. You may have a sigmoidoscopy every 5 years or a colonoscopy every 10 years starting at age 50.  Hepatitis C blood test.  Hepatitis B blood test.  Sexually transmitted disease (STD) testing.  Diabetes screening. This is done by checking your blood sugar (glucose) after you have not eaten for a while (fasting). You may have this done every 1-3 years.  Bone density scan. This is done to screen for osteoporosis. You may have this done starting at age 70.  Mammogram. This may be done every 1-2 years. Talk to your health care provider about how often you should have regular mammograms. Talk with your health care provider about your test results, treatment options, and if necessary, the need for more tests. Vaccines  Your health care provider may recommend certain vaccines, such as:  Influenza vaccine. This is recommended every year.  Tetanus, diphtheria, and acellular pertussis (Tdap, Td) vaccine. You may need a Td booster every 10 years.  Zoster vaccine. You may need this after age 85.  Pneumococcal 13-valent conjugate (PCV13)  vaccine. One dose is recommended after age 5.  Pneumococcal polysaccharide (PPSV23) vaccine. One dose is recommended after age 14. Talk to your health care provider about which screenings and vaccines you need and how often you need them. This information is not intended to replace advice given to you by your health care provider. Make sure you discuss any questions you have with your health care provider. Document Released: 12/22/2015 Document Revised: 08/14/2016 Document Reviewed: 09/26/2015 Elsevier Interactive Patient Education  2017 [place] Prevention in the Home Falls can cause injuries. They can happen to people of all ages. There are many things you can do to make your home safe and to help prevent falls. What can I do on the outside of my home?  Regularly fix the edges of walkways and driveways and fix any cracks.  Remove anything that might make you trip as you walk through a door, such as a raised step or threshold.  Trim any bushes or trees on the path to your home.  Use bright outdoor lighting.  Clear any walking paths of anything that might make someone trip, such as rocks or tools.  Regularly check to see if handrails are loose or broken. Make sure that both sides of any steps have handrails.  Any raised decks and porches should have guardrails on the edges.  Have any leaves, snow, or ice cleared regularly.  Use sand or salt on walking paths during winter.  Clean up any spills in your garage right away. This includes oil or grease spills. What can I do in the bathroom?  Use night lights.  Install grab bars by the toilet and in the tub and shower. Do not use towel bars as grab bars.  Use non-skid mats or decals in the tub or shower.  If you need to sit down in the shower, use a plastic, non-slip stool.  Keep the floor dry. Clean up any water that spills on the floor as soon as it happens.  Remove soap buildup in the tub or shower regularly.   Attach bath mats securely with double-sided non-slip rug tape.  Do not have throw rugs and other things on the floor that can make you trip. What can I do in the bedroom?  Use night lights.  Make sure that you have a light by your bed that is easy to reach.  Do not use any sheets or blankets that are too big for your bed. They should not hang down onto the floor.  Have a firm chair that has side arms. You can use this for support while you get dressed.  Do not have throw rugs and other things on the floor that can make you trip. What can I do in the kitchen?  Clean up any spills right away.  Avoid walking on wet floors.  Keep items that you use a lot in easy-to-reach places.  If you need to reach something above you, use a strong step stool that has a grab bar.  Keep electrical cords out of the way.  Do not use floor polish or wax that makes floors slippery. If you must use wax, use non-skid floor wax.  Do not have throw rugs and other things on the floor that  can make you trip. What can I do with my stairs?  Do not leave any items on the stairs.  Make sure that there are handrails on both sides of the stairs and use them. Fix handrails that are broken or loose. Make sure that handrails are as long as the stairways.  Check any carpeting to make sure that it is firmly attached to the stairs. Fix any carpet that is loose or worn.  Avoid having throw rugs at the top or bottom of the stairs. If you do have throw rugs, attach them to the floor with carpet tape.  Make sure that you have a light switch at the top of the stairs and the bottom of the stairs. If you do not have them, ask someone to add them for you. What else can I do to help prevent falls?  Wear shoes that:  Do not have high heels.  Have rubber bottoms.  Are comfortable and fit you well.  Are closed at the toe. Do not wear sandals.  If you use a stepladder:  Make sure that it is fully opened. Do not climb  a closed stepladder.  Make sure that both sides of the stepladder are locked into place.  Ask someone to hold it for you, if possible.  Clearly mark and make sure that you can see:  Any grab bars or handrails.  First and last steps.  Where the edge of each step is.  Use tools that help you move around (mobility aids) if they are needed. These include:  Canes.  Walkers.  Scooters.  Crutches.  Turn on the lights when you go into a dark area. Replace any light bulbs as soon as they burn out.  Set up your furniture so you have a clear path. Avoid moving your furniture around.  If any of your floors are uneven, fix them.  If there are any pets around you, be aware of where they are.  Review your medicines with your doctor. Some medicines can make you feel dizzy. This can increase your chance of falling. Ask your doctor what other things that you can do to help prevent falls. This information is not intended to replace advice given to you by your health care provider. Make sure you discuss any questions you have with your health care provider. Document Released: 09/21/2009 Document Revised: 05/02/2016 Document Reviewed: 12/30/2014 Elsevier Interactive Patient Education  2017 [employer].

## 2019-03-17 ENCOUNTER — Ambulatory Visit: Payer: Medicare HMO | Admitting: Nurse Practitioner

## 2019-04-01 ENCOUNTER — Telehealth: Payer: Self-pay | Admitting: Nurse Practitioner

## 2019-04-01 NOTE — Telephone Encounter (Signed)
Pt consent to Virtual visit 04/15/19.

## 2019-04-15 ENCOUNTER — Encounter: Payer: Self-pay | Admitting: Nurse Practitioner

## 2019-04-15 ENCOUNTER — Other Ambulatory Visit: Payer: Self-pay | Admitting: Nurse Practitioner

## 2019-04-15 ENCOUNTER — Other Ambulatory Visit: Payer: Self-pay

## 2019-04-15 ENCOUNTER — Ambulatory Visit (INDEPENDENT_AMBULATORY_CARE_PROVIDER_SITE_OTHER): Payer: Medicare HMO | Admitting: Nurse Practitioner

## 2019-04-15 DIAGNOSIS — I10 Essential (primary) hypertension: Secondary | ICD-10-CM

## 2019-04-15 DIAGNOSIS — E559 Vitamin D deficiency, unspecified: Secondary | ICD-10-CM | POA: Diagnosis not present

## 2019-04-15 DIAGNOSIS — E119 Type 2 diabetes mellitus without complications: Secondary | ICD-10-CM

## 2019-04-15 DIAGNOSIS — E1142 Type 2 diabetes mellitus with diabetic polyneuropathy: Secondary | ICD-10-CM | POA: Insufficient documentation

## 2019-04-15 NOTE — Progress Notes (Signed)
Virtual Visit via Video    This visit type was conducted due to national recommendations for restrictions regarding the COVID-19 Pandemic (e.g. social distancing) in an effort to limit this patient's exposure and mitigate transmission in our community.  Patients identity confirmed using two different identifiers.  This format is felt to be most appropriate for this patient at this time.  All issues noted in this document were discussed and addressed.  No physical exam was performed (except for noted visual exam findings with Video Visits).    Date:  04/15/2019   ID:  [name], DOB 09-Nov-1946, MRN [id number]  Patient Location:  Car parked - spoke with Malon Kindle  Provider location:   Office    Chief Complaint:  prediabetes  History of Present Illness:    Yvonne Barron is a 73 y.o. female who presents via video conferencing for a telehealth visit today.    The patient does not have symptoms concerning for COVID-19 infection (fever, chills, cough, or new shortness of breath).   trainer 3 times a week since Mid January Diabetes  She presents for her follow-up diabetic visit. Diabetes type: prediabetes. Her disease course has been stable. There are no hypoglycemic associated symptoms. Pertinent negatives for hypoglycemia include no dizziness. There are no diabetic associated symptoms. There are no hypoglycemic complications. There are no diabetic complications. There are no known risk factors for coronary artery disease. Current diabetic treatment includes diet. Her weight is stable.     Past Medical History:  Diagnosis Date  . History of breast cancer 2009--  INVASIVE DUCTAL IN SITU--  S/P MASTECTOMY   NO RECURRENCE  . Hypertension   . Urethral caruncle    Past Surgical History:  Procedure Laterality Date  . CYSTOSCOPY WITH URETHRAL CARUNCLE  07/10/2012   Procedure: CYSTOSCOPY WITH URETHRAL CARUNCLE;  Surgeon: Claybon Jabs, MD;  Location: [facility];  Service: Urology;  Laterality: N/A;    EXCISION OF CARUNCLE   . LEFT TOTAL MASTECTOMY/ LEFT AXILLARY SENTINEL LYMPH NODE BX  03-01-2008   INVASIVE  DUCTAL CARCINOMA IN SITU  . LEFT WRIST SURG  2008   REPAIR NERVE  . MASTECTOMY Left 2009  . VAGINAL HYSTERECTOMY  1998   W/ BILATERAL SALPINGOOPHECTOMY     Current Meds  Medication Sig  . benzonatate (TESSALON) 200 MG capsule Take 200 mg by mouth as needed for cough.  . calcium carbonate (OS-CAL - DOSED IN MG OF ELEMENTAL CALCIUM) 1250 (500 Ca) MG tablet Take 1 tablet by mouth.  . cholecalciferol (VITAMIN D) 1000 units tablet Take 1,000 Units by mouth 2 (two) times daily.  Marland Kitchen glucose blood (ONETOUCH VERIO) test strip Check blood sugar once per day Dx code: R73.09  . hydrochlorothiazide (HYDRODIURIL) 25 MG tablet Take 1 tablet (25 mg total) by mouth daily.  . valACYclovir (VALTREX) 1000 MG tablet Take 1,000 mg by mouth as needed.  . vitamin C (ASCORBIC ACID) 500 MG tablet Take 500 mg by mouth daily.     Allergies:   Patient has no known allergies.   Social History   Tobacco Use  . Smoking status: Former Smoker    Packs/day: 2.00    Years: 20.00    Pack years: 40.00    Last attempt to quit: 01/19/1987    Years since quitting: 32.2  . Smokeless tobacco: Never Used  Substance Use Topics  . Alcohol use: No  . Drug use: No     Family Hx: The patient's  family history includes Diabetes in her brother, father, and mother; Kidney disease in her father.  ROS:   Please see the history of present illness.    Review of Systems  Constitutional: Negative.   Respiratory: Negative.   Cardiovascular: Negative.   Neurological: Negative for dizziness and tingling.  Psychiatric/Behavioral: Negative.     All other systems reviewed and are negative.   Labs/Other Tests and Data Reviewed:    Recent Labs: 12/16/2018: BUN 20; Creatinine, Ser 0.69; Potassium 3.9; Sodium 140   Recent Lipid Panel Lab Results  Component Value Date/Time    CHOL 186 06/30/2007 10:11 AM   TRIG 48 06/30/2007 10:11 AM   HDL 57.2 06/30/2007 10:11 AM   CHOLHDL 3.3 CALC 06/30/2007 10:11 AM   LDLCALC 119 (H) 06/30/2007 10:11 AM    Wt Readings from Last 3 Encounters:  03/11/19 154 lb 9.6 oz (70.1 kg)  12/16/18 156 lb 9.6 oz (71 kg)  09/14/18 158 lb 3.2 oz (71.8 kg)     Exam:    Vital Signs:  There were no vitals taken for this visit.    Physical Exam  Constitutional: She is oriented to person, place, and time and well-developed, well-nourished, and in no distress.  Pulmonary/Chest: Effort normal.  Neurological: She is alert and oriented to person, place, and time.  Psychiatric: Mood, memory, affect and judgment normal.    ASSESSMENT & PLAN:    1. Type 2 diabetes mellitus without complication, without long-term current use of insulin (HCC)  Chronic, improving  Continue with current medications  Encouraged to limit intake of sugary foods and drinks  Encouraged to increase physical activity to 150 minutes per week - Hemoglobin A1c - BMP8+eGFR; Future - Ambulatory referral to Ophthalmology  2. Essential hypertension . No blood pressure this visit was virtual . BMP ordered to check renal function.  . The importance of regular exercise and dietary modification was stressed to the patient.  . Continue with your exercising at home until you can get back to your trainer  3. Vitamin D deficiency  Will check vitamin D level and supplement as needed.     Also encouraged to spend 15 minutes in the sun daily.  - Vitamin D 1,25 Dihydroxy   COVID-19 Education: The signs and symptoms of COVID-19 were discussed with the patient and how to seek care for testing (follow up with PCP or arrange E-visit).  The importance of social distancing was discussed today.  Patient Risk:   After full review of this patients clinical status, I feel that they are at least moderate risk at this time.  Time:   Today, I have spent 13 minutes/ seconds  with the patient with telehealth technology discussing above diagnoses.     Medication Adjustments/Labs and Tests Ordered: Current medicines are reviewed at length with the patient today.  Concerns regarding medicines are outlined above.   Tests Ordered: No orders of the defined types were placed in this encounter.   Medication Changes: No orders of the defined types were placed in this encounter.   Disposition:  Follow up in 3 month(s)  Signed, Minette Brine, FNP

## 2019-04-16 ENCOUNTER — Ambulatory Visit: Payer: Medicare HMO | Admitting: Nurse Practitioner

## 2019-04-19 ENCOUNTER — Encounter: Payer: Self-pay | Admitting: Nurse Practitioner

## 2019-04-19 LAB — VITAMIN D 1,25 DIHYDROXY
Vitamin D 1, 25 (OH)2 Total: 64 pg/mL
Vitamin D2 1, 25 (OH)2: <10 pg/mL
Vitamin D3 1, 25 (OH)2: 60 pg/mL

## 2019-04-19 LAB — BMP8+EGFR
BUN/Creatinine Ratio: 13 (ref 12–28)
BUN: 9 mg/dL (ref 8–27)
CO2: 25 mmol/L (ref 20–29)
Calcium: 10.2 mg/dL (ref 8.7–10.3)
Chloride: 101 mmol/L (ref 96–106)
Creatinine, Ser: 0.7 mg/dL (ref 0.57–1.00)
GFR calc Af Amer: 101 mL/min/{1.73_m2} (ref >59)
GFR calc non Af Amer: 87 mL/min/{1.73_m2} (ref >59)
Glucose: 97 mg/dL (ref 65–99)
Potassium: 3.8 mmol/L (ref 3.5–5.2)
Sodium: 142 mmol/L (ref 134–144)

## 2019-04-19 LAB — HEMOGLOBIN A1C
Est. average glucose Bld gHb Est-mCnc: 140 mg/dL
Hgb A1c MFr Bld: 6.5 % — ABNORMAL HIGH (ref 4.8–5.6)

## 2019-05-06 ENCOUNTER — Other Ambulatory Visit: Payer: Self-pay | Admitting: Obstetrics and Gynecology

## 2019-05-06 DIAGNOSIS — Z1231 Encounter for screening mammogram for malignant neoplasm of breast: Secondary | ICD-10-CM

## 2019-06-03 ENCOUNTER — Encounter: Payer: Self-pay | Admitting: Nurse Practitioner

## 2019-06-04 DIAGNOSIS — Z20828 Contact with and (suspected) exposure to other viral communicable diseases: Secondary | ICD-10-CM | POA: Diagnosis not present

## 2019-06-06 ENCOUNTER — Other Ambulatory Visit: Payer: Self-pay | Admitting: Nurse Practitioner

## 2019-06-08 ENCOUNTER — Encounter: Payer: Self-pay | Admitting: Nurse Practitioner

## 2019-06-08 ENCOUNTER — Other Ambulatory Visit: Payer: Self-pay

## 2019-06-10 DIAGNOSIS — H18413 Arcus senilis, bilateral: Secondary | ICD-10-CM | POA: Diagnosis not present

## 2019-06-10 DIAGNOSIS — H25013 Cortical age-related cataract, bilateral: Secondary | ICD-10-CM | POA: Diagnosis not present

## 2019-06-10 DIAGNOSIS — H16223 Keratoconjunctivitis sicca, not specified as Sjogren's, bilateral: Secondary | ICD-10-CM | POA: Diagnosis not present

## 2019-06-10 DIAGNOSIS — H2513 Age-related nuclear cataract, bilateral: Secondary | ICD-10-CM | POA: Diagnosis not present

## 2019-06-10 DIAGNOSIS — H353131 Nonexudative age-related macular degeneration, bilateral, early dry stage: Secondary | ICD-10-CM | POA: Diagnosis not present

## 2019-06-10 DIAGNOSIS — H11153 Pinguecula, bilateral: Secondary | ICD-10-CM | POA: Diagnosis not present

## 2019-06-10 LAB — HM DIABETES EYE EXAM

## 2019-06-25 ENCOUNTER — Other Ambulatory Visit: Payer: Self-pay

## 2019-06-25 ENCOUNTER — Ambulatory Visit
Admission: RE | Admit: 2019-06-25 | Discharge: 2019-06-25 | Disposition: A | Discharge status: Home / Self Care | Payer: Medicare HMO | Source: Ambulatory Visit | Attending: Obstetrics and Gynecology | Admitting: Obstetrics and Gynecology

## 2019-06-25 DIAGNOSIS — Z1231 Encounter for screening mammogram for malignant neoplasm of breast: Secondary | ICD-10-CM | POA: Diagnosis not present

## 2019-07-19 ENCOUNTER — Encounter: Payer: Self-pay | Admitting: Nurse Practitioner

## 2019-07-19 ENCOUNTER — Other Ambulatory Visit: Payer: Self-pay

## 2019-07-19 ENCOUNTER — Ambulatory Visit (INDEPENDENT_AMBULATORY_CARE_PROVIDER_SITE_OTHER): Payer: Medicare HMO | Admitting: Nurse Practitioner

## 2019-07-19 VITALS — BP 130/84 | HR 60 | Temp 98.5°F | Ht 63.2 in | Wt 154.4 lb

## 2019-07-19 DIAGNOSIS — M7741 Metatarsalgia, right foot: Secondary | ICD-10-CM | POA: Diagnosis not present

## 2019-07-19 DIAGNOSIS — M7742 Metatarsalgia, left foot: Secondary | ICD-10-CM | POA: Diagnosis not present

## 2019-07-19 DIAGNOSIS — E119 Type 2 diabetes mellitus without complications: Secondary | ICD-10-CM

## 2019-07-19 DIAGNOSIS — I10 Essential (primary) hypertension: Secondary | ICD-10-CM

## 2019-07-19 DIAGNOSIS — G64 Other disorders of peripheral nervous system: Secondary | ICD-10-CM | POA: Diagnosis not present

## 2019-07-19 DIAGNOSIS — M79671 Pain in right foot: Secondary | ICD-10-CM | POA: Diagnosis not present

## 2019-07-19 DIAGNOSIS — M79672 Pain in left foot: Secondary | ICD-10-CM | POA: Diagnosis not present

## 2019-07-19 LAB — POCT UA - MICROALBUMIN
Albumin/Creatinine Ratio, Urine, POC: 30
Creatinine, POC: 200 mg/dL
Microalbumin Ur, POC: 10 mg/L

## 2019-07-19 MED ORDER — HYDROCHLOROTHIAZIDE 12.5 MG PO TABS: 12.5000 mg | ORAL_TABLET | Freq: Every day | ORAL | 1 refills | Status: DC

## 2019-07-19 NOTE — Progress Notes (Addendum)
Subjective:     Patient ID: Yvonne Barron , female    DOB: Jul 03, 1946 , 73 y.o.   MRN: [id number]   Chief Complaint  Patient presents with  . PREDIABETES    patient presents today for a 3 month f/u    HPI  Diabetes She presents for her follow-up diabetic visit. Diabetes type: prediabetes. There are no hypoglycemic associated symptoms. Pertinent negatives for hypoglycemia include no dizziness or headaches. There are no diabetic associated symptoms. There are no hypoglycemic complications. There are no diabetic complications. Risk factors for coronary artery disease include hypertension. Current diabetic treatment includes oral agent (dual therapy). She is compliant with treatment all of the time. (Blood sugar 85 - 140)  Hypertension Pertinent negatives include no headaches.     Past Medical History:  Diagnosis Date  . History of breast cancer 2009--  INVASIVE DUCTAL IN SITU--  S/P MASTECTOMY   NO RECURRENCE  . Hypertension   . Urethral caruncle      Family History  Problem Relation Age of Onset  . Diabetes Mother   . Diabetes Father   . Kidney disease Father   . Diabetes Brother      Current Outpatient Medications:  .  calcium carbonate (OS-CAL - DOSED IN MG OF ELEMENTAL CALCIUM) 1250 (500 Ca) MG tablet, Take 1 tablet by mouth., Disp: , Rfl:  .  cholecalciferol (VITAMIN D) 1000 units tablet, Take 1,000 Units by mouth 2 (two) times daily., Disp: , Rfl:  .  cyanocobalamin 100 MCG tablet, Take 100 mcg by mouth daily., Disp: , Rfl:  .  glucose blood (ONETOUCH VERIO) test strip, Check blood sugar once per day Dx code: R73.09, Disp: 100 each, Rfl: 3 .  hydrochlorothiazide (HYDRODIURIL) 25 MG tablet, Take 1 tablet by mouth once daily, Disp: 90 tablet, Rfl: 0 .  valACYclovir (VALTREX) 1000 MG tablet, Take 1,000 mg by mouth as needed., Disp: , Rfl:  .  vitamin C (ASCORBIC ACID) 500 MG tablet, Take 500 mg by mouth daily., Disp: , Rfl:  .  benzonatate (TESSALON) 200 MG  capsule, Take 200 mg by mouth as needed for cough., Disp: , Rfl:    No Known Allergies   Review of Systems  Constitutional: Negative.   Respiratory: Negative.   Cardiovascular: Negative.   Musculoskeletal: Negative.   Neurological: Negative for dizziness and headaches.     Today's Vitals   07/19/19 1149  BP: 130/84  Pulse: 60  Temp: 98.5 F (36.9 C)  TempSrc: Oral  Weight: 154 lb 6.4 oz (70 kg)  Height: 5' 3.2" (1.605 m)  PainSc: 0-No pain   Body mass index is 27.18 kg/m.   Objective:  Physical Exam Constitutional:      Appearance: Normal appearance.  Cardiovascular:     Rate and Rhythm: Normal rate and regular rhythm.     Pulses: Normal pulses.     Heart sounds: Normal heart sounds. No murmur.  Skin:    General: Skin is warm and dry.     Capillary Refill: Capillary refill takes less than 2 seconds.  Neurological:     General: No focal deficit present.     Mental Status: She is alert and oriented to person, place, and time.         Assessment And Plan:     1. Essential hypertension B/P is controlled.  BMP ordered to check renal function.  Will decrease HCTZ - hydrochlorothiazide (HYDRODIURIL) 12.5 MG tablet; Take 1 tablet (12.5 mg total) by mouth daily.  Dispense: 90 tablet; Refill: 1 - BMP8+eGFR  2. Type 2 diabetes mellitus without complication, without long-term current use of insulin (HCC)  Chronic, controlled with diet and exercise  Continue with current medications  Encouraged to limit intake of sugary foods and drinks  continue physical activity to 150 minutes per week with personal trainer - Hemoglobin A1c   Minette Brine, FNP    THE PATIENT IS ENCOURAGED TO PRACTICE SOCIAL DISTANCING DUE TO THE COVID-19 PANDEMIC.

## 2019-07-20 LAB — HEMOGLOBIN A1C
Est. average glucose Bld gHb Est-mCnc: 143 mg/dL
Hgb A1c MFr Bld: 6.6 % — ABNORMAL HIGH (ref 4.8–5.6)

## 2019-07-20 LAB — BMP8+EGFR
BUN/Creatinine Ratio: 21 (ref 12–28)
BUN: 14 mg/dL (ref 8–27)
CO2: 26 mmol/L (ref 20–29)
Calcium: 9.7 mg/dL (ref 8.7–10.3)
Chloride: 101 mmol/L (ref 96–106)
Creatinine, Ser: 0.68 mg/dL (ref 0.57–1.00)
GFR calc Af Amer: 102 mL/min/{1.73_m2} (ref >59)
GFR calc non Af Amer: 88 mL/min/{1.73_m2} (ref >59)
Glucose: 103 mg/dL — ABNORMAL HIGH (ref 65–99)
Potassium: 4 mmol/L (ref 3.5–5.2)
Sodium: 141 mmol/L (ref 134–144)

## 2019-07-23 ENCOUNTER — Encounter: Payer: Self-pay | Admitting: Nurse Practitioner

## 2019-07-26 ENCOUNTER — Encounter: Payer: Self-pay | Admitting: Nurse Practitioner

## 2019-08-30 DIAGNOSIS — E1140 Type 2 diabetes mellitus with diabetic neuropathy, unspecified: Secondary | ICD-10-CM | POA: Diagnosis not present

## 2019-09-16 DIAGNOSIS — S29012A Strain of muscle and tendon of back wall of thorax, initial encounter: Secondary | ICD-10-CM | POA: Diagnosis not present

## 2019-09-16 DIAGNOSIS — R69 Illness, unspecified: Secondary | ICD-10-CM | POA: Diagnosis not present

## 2019-09-16 DIAGNOSIS — M25561 Pain in right knee: Secondary | ICD-10-CM | POA: Diagnosis not present

## 2019-09-27 DIAGNOSIS — S29012A Strain of muscle and tendon of back wall of thorax, initial encounter: Secondary | ICD-10-CM | POA: Diagnosis not present

## 2019-09-27 DIAGNOSIS — M25561 Pain in right knee: Secondary | ICD-10-CM | POA: Diagnosis not present

## 2019-09-27 DIAGNOSIS — R69 Illness, unspecified: Secondary | ICD-10-CM | POA: Diagnosis not present

## 2019-10-06 ENCOUNTER — Encounter: Payer: Self-pay | Admitting: Nurse Practitioner

## 2019-10-07 ENCOUNTER — Ambulatory Visit (INDEPENDENT_AMBULATORY_CARE_PROVIDER_SITE_OTHER): Payer: Medicare HMO | Admitting: Internal Medicine

## 2019-10-07 ENCOUNTER — Other Ambulatory Visit: Payer: Self-pay

## 2019-10-07 ENCOUNTER — Encounter: Payer: Self-pay | Admitting: Internal Medicine

## 2019-10-07 VITALS — BP 122/78 | HR 58 | Temp 98.2°F

## 2019-10-07 DIAGNOSIS — W57XXXA Bitten or stung by nonvenomous insect and other nonvenomous arthropods, initial encounter: Secondary | ICD-10-CM

## 2019-10-07 DIAGNOSIS — S20169A Insect bite (nonvenomous) of breast, unspecified breast, initial encounter: Secondary | ICD-10-CM

## 2019-10-07 DIAGNOSIS — N61 Mastitis without abscess: Secondary | ICD-10-CM

## 2019-10-07 MED ORDER — CEPHALEXIN 500 MG PO CAPS: ORAL_CAPSULE | ORAL | 0 refills | Status: DC

## 2019-10-07 NOTE — Progress Notes (Signed)
Subjective:     Patient ID: Yvonne Barron , female    DOB: 04/24/1946 , 73 y.o.   MRN: [id number]   Chief Complaint  Patient presents with  . bug bites    HPI  Pt states she was standing outside 4 days ago, and felt stinging on her breasts and went home to take her shirt and braw off to check, but did not see any bugs on her. She noticed a bite on her L chest below the breast area, and top of R breast. This area was very itchy and she has been applying [place], today noticed redness above her areola and is very tender. Just the rubbing of her clothing on this area is irritating to her.   Past Medical History:  Diagnosis Date  . History of breast cancer 2009--  INVASIVE DUCTAL IN SITU--  S/P MASTECTOMY   NO RECURRENCE  . Hypertension   . Urethral caruncle      Family History  Problem Relation Age of Onset  . Diabetes Mother   . Diabetes Father   . Kidney disease Father   . Diabetes Brother      Current Outpatient Medications:  .  benzonatate (TESSALON) 200 MG capsule, Take 200 mg by mouth as needed for cough., Disp: , Rfl:  .  calcium carbonate (OS-CAL - DOSED IN MG OF ELEMENTAL CALCIUM) 1250 (500 Ca) MG tablet, Take 1 tablet by mouth., Disp: , Rfl:  .  cholecalciferol (VITAMIN D) 1000 units tablet, Take 1,000 Units by mouth 2 (two) times daily., Disp: , Rfl:  .  cyanocobalamin 100 MCG tablet, Take 100 mcg by mouth daily., Disp: , Rfl:  .  glucose blood (ONETOUCH VERIO) test strip, Check blood sugar once per day Dx code: R73.09, Disp: 100 each, Rfl: 3 .  hydrochlorothiazide (HYDRODIURIL) 12.5 MG tablet, Take 1 tablet (12.5 mg total) by mouth daily., Disp: 90 tablet, Rfl: 1 .  valACYclovir (VALTREX) 1000 MG tablet, Take 1,000 mg by mouth as needed., Disp: , Rfl:  .  vitamin C (ASCORBIC ACID) 500 MG tablet, Take 500 mg by mouth daily., Disp: , Rfl:    No Known Allergies   Review of Systems  No fever, chills or sweats. Has itching, redness on both  insect bites areas and painful area on R breast. Denies URI, fever or chills. Was tested for Covid this week at her church, but has not heard from results.  Today's Vitals   10/07/19 1415  BP: 122/78  Pulse: (!) 58  Temp: 98.2 F (36.8 C)  TempSrc: Oral   There is no height or weight on file to calculate BMI.   Objective:  Physical Exam Vitals signs and nursing note (visit down in lobby bathrom) reviewed.  HENT:     Right Ear: External ear normal.     Left Ear: External ear normal.  Eyes:     General: No scleral icterus.    Conjunctiva/sclera: Conjunctivae normal.  Neck:     Musculoskeletal: Normal range of motion.  Pulmonary:     Effort: Pulmonary effort is normal.  Skin:    General: Skin is warm.     Capillary Refill: Capillary refill takes less than 2 seconds.     Findings: Erythema present. No rash.     Comments: She has a couple of insect bite areas, one on each breast which looks the same. One of R upper breast and one on L lower. She has mild warmth and erythema below  the R breast bug bite of about 4x2.5 cm which is not indurated, but is tender. No R axillary nodes palpated.   Neurological:     Mental Status: She is alert and oriented to person, place, and time.  Psychiatric:        Mood and Affect: Mood normal.        Behavior: Behavior normal.        Thought Content: Thought content normal.        Judgment: Judgment normal.     Assessment And Plan:    1. Insect bite of breast, unspecified laterality, initial encounter- acute. May contnue the Lavender essential oil prn itching.   2. Cellulitis of right breast- acute. I placed her on Keflex 500 mg 2 bid x 10 days. If she gets worse this weekend needs to go to Urgent Care. I reviewed symptoms of shingles to watch out for since she has sensitivity to light clothing touch. But because she has bilateral bug bites, I have low suspicion of this.     Braiden Rodman RODRIGUEZ-SOUTHWORTH, PA-C    THE PATIENT IS ENCOURAGED TO  PRACTICE SOCIAL DISTANCING DUE TO THE COVID-19 PANDEMIC.

## 2019-10-12 ENCOUNTER — Encounter: Payer: Self-pay | Admitting: Nurse Practitioner

## 2019-10-19 ENCOUNTER — Ambulatory Visit: Payer: Medicare HMO | Admitting: Nurse Practitioner

## 2019-10-19 ENCOUNTER — Encounter: Payer: Self-pay | Admitting: Internal Medicine

## 2019-10-19 ENCOUNTER — Encounter: Payer: Self-pay | Admitting: Nurse Practitioner

## 2019-10-20 ENCOUNTER — Telehealth: Payer: Self-pay

## 2019-10-20 ENCOUNTER — Other Ambulatory Visit: Payer: Self-pay

## 2019-10-20 ENCOUNTER — Other Ambulatory Visit (HOSPITAL_COMMUNITY)
Admission: RE | Admit: 2019-10-20 | Discharge: 2019-10-20 | Disposition: A | Discharge status: Home / Self Care | Payer: Medicare HMO | Source: Ambulatory Visit | Attending: Nurse Practitioner | Admitting: Nurse Practitioner

## 2019-10-20 ENCOUNTER — Encounter: Payer: Self-pay | Admitting: Nurse Practitioner

## 2019-10-20 ENCOUNTER — Ambulatory Visit (INDEPENDENT_AMBULATORY_CARE_PROVIDER_SITE_OTHER): Payer: Medicare HMO | Admitting: Nurse Practitioner

## 2019-10-20 VITALS — BP 112/74 | HR 88 | Temp 98.7°F | Ht 63.2 in | Wt 150.2 lb

## 2019-10-20 DIAGNOSIS — R300 Dysuria: Secondary | ICD-10-CM | POA: Diagnosis not present

## 2019-10-20 DIAGNOSIS — Z03818 Encounter for observation for suspected exposure to other biological agents ruled out: Secondary | ICD-10-CM | POA: Diagnosis not present

## 2019-10-20 DIAGNOSIS — R7309 Other abnormal glucose: Secondary | ICD-10-CM | POA: Diagnosis not present

## 2019-10-20 DIAGNOSIS — N760 Acute vaginitis: Secondary | ICD-10-CM

## 2019-10-20 DIAGNOSIS — R1032 Left lower quadrant pain: Secondary | ICD-10-CM | POA: Diagnosis not present

## 2019-10-20 DIAGNOSIS — I10 Essential (primary) hypertension: Secondary | ICD-10-CM

## 2019-10-20 LAB — POCT URINALYSIS DIPSTICK
Bilirubin, UA: NEGATIVE
Glucose, UA: NEGATIVE
Ketones, UA: NEGATIVE
Nitrite, UA: NEGATIVE
Protein, UA: NEGATIVE
Spec Grav, UA: 1.02 (ref 1.010–1.025)
Urobilinogen, UA: 0.2 E.U./dL
pH, UA: 5.5 (ref 5.0–8.0)

## 2019-10-20 MED ORDER — FLUCONAZOLE 150 MG PO TABS: ORAL_TABLET | ORAL | 0 refills | Status: DC

## 2019-10-20 NOTE — Progress Notes (Signed)
Subjective:     Patient ID: Yvonne Barron , female    DOB: [birth year] , 73 y.o.   MRN: [id number]   Chief Complaint  Patient presents with  . Dysuria    patient stated she is experiecing frequent urination, pressure in her lower abdominal . patient stated her symptoms started after taking the antibotic so she stop taking it.    HPI  She was taking antibiotics for an insect bite treated with cephalexin and stopped prematurely because she was feeling bad.  She laid down yesterday early.  She feels like her fever was up to 102.  She is having left lower quadrant discomfort.  She does not have her ovaries any longer from 20 years ago.  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  She lives with her son - who continues to go to work in [place].    Hypertension Pertinent negatives include no headaches.     Past Medical History:  Diagnosis Date  . History of breast cancer 2009--  INVASIVE DUCTAL IN SITU--  S/P MASTECTOMY   NO RECURRENCE  . Hypertension   . Urethral caruncle      Family History  Problem Relation Age of Onset  . Diabetes Mother   . Diabetes Father   . Kidney disease Father   . Diabetes Brother      Current Outpatient Medications:  .  calcium carbonate (OS-CAL - DOSED IN MG OF ELEMENTAL CALCIUM) 1250 (500 Ca) MG tablet, Take 1 tablet by mouth., Disp: , Rfl:  .  cholecalciferol (VITAMIN D) 1000 units tablet, Take 1,000 Units by mouth 2 (two) times daily., Disp: , Rfl:  .  cyanocobalamin 100 MCG tablet, Take 100 mcg by mouth daily., Disp: , Rfl:  .  glucose blood (ONETOUCH VERIO) test strip, Check blood sugar once per day Dx code: R73.09, Disp: 100 each, Rfl: 3 .  hydrochlorothiazide (HYDRODIURIL) 12.5 MG tablet, Take 1 tablet (12.5 mg total) by mouth daily., Disp: 90 tablet, Rfl: 1 .  valACYclovir (VALTREX) 1000 MG tablet, Take 1,000 mg by mouth as needed., Disp: , Rfl:  .  vitamin C (ASCORBIC ACID) 500 MG tablet, Take 500 mg by mouth  daily., Disp: , Rfl:  .  cephALEXin (KEFLEX) 500 MG capsule, 2 bid x 10 days (Patient not taking: Reported on 10/20/2019), Disp: 40 capsule, Rfl: 0   No Known Allergies   Review of Systems  Constitutional: Negative.   Respiratory: Negative.   Cardiovascular: Negative.   Genitourinary: Negative for flank pain and urgency.  Musculoskeletal: Negative.   Neurological: Negative for dizziness and headaches.  Psychiatric/Behavioral: Negative.      Today's Vitals   10/20/19 1114  BP: 112/74  Pulse: 88  Temp: 98.7 F (37.1 C)  TempSrc: Oral  Weight: 150 lb 3.2 oz (68.1 kg)  Height: 5' 3.2" (1.605 m)  PainSc: 4   PainLoc: Abdomen   Body mass index is 26.44 kg/m.   Objective:  Physical Exam Constitutional:      General: She is not in acute distress.    Appearance: Normal appearance.  HENT:     Head: Normocephalic.  Cardiovascular:     Rate and Rhythm: Normal rate and regular rhythm.     Pulses: Normal pulses.     Heart sounds: Normal heart sounds. No murmur.  Pulmonary:     Effort: Pulmonary effort is normal. No respiratory distress.     Breath sounds: Normal breath sounds.  Abdominal:  General: Abdomen is flat. Bowel sounds are normal. There is no distension.     Palpations: Abdomen is soft. There is no mass.     Tenderness: There is abdominal tenderness (mild tenderness to low abdomen at bladder).  Musculoskeletal: Normal range of motion.        General: No swelling or tenderness.  Skin:    General: Skin is warm and dry.     Capillary Refill: Capillary refill takes less than 2 seconds.  Neurological:     General: No focal deficit present.     Mental Status: She is alert and oriented to person, place, and time.     Cranial Nerves: No cranial nerve deficit.  Psychiatric:        Mood and Affect: Mood normal.        Behavior: Behavior normal.        Thought Content: Thought content normal.        Judgment: Judgment normal.         Assessment And Plan:     1.  Dysuria  Urinalysis was positive for blood moderate will send urine for culture - POCT Urinalysis Dipstick (81002) - Cervicovaginal ancillary only  2. Left lower quadrant abdominal pain  Pain is actually to left groin area this may be muscular - CMP14+EGFR - CBC without diff  3. Essential hypertension . B/P is controlled.  . CMP ordered to check renal function.  . The importance of regular exercise and dietary modification was stressed to the patient.   4. Abnormal glucose  Chronic, she is doing better and continues with working out with a trainer  Continue with current regimen - Hemoglobin A1c  5. Acute vaginitis  Pale yellow thick discharge present to vaginal vault - fluconazole (DIFLUCAN) 150 MG tablet; Take 1 tablet by mouth now repeat in 5 days  Dispense: 2 tablet; Refill: 0 - Urine culture - Cervicovaginal ancillary only     Minette Brine, FNP    THE PATIENT IS ENCOURAGED TO PRACTICE SOCIAL DISTANCING DUE TO THE COVID-19 PANDEMIC.

## 2019-10-20 NOTE — Telephone Encounter (Signed)
When you receive lab results, pt would like to have them mailed to her

## 2019-10-20 NOTE — Telephone Encounter (Signed)
Okay I will make sure to do so. Thank you!

## 2019-10-21 LAB — HEMOGLOBIN A1C
Est. average glucose Bld gHb Est-mCnc: 134 mg/dL
Hgb A1c MFr Bld: 6.3 % — ABNORMAL HIGH (ref 4.8–5.6)

## 2019-10-21 LAB — CMP14+EGFR
ALT: 10 IU/L (ref 0–32)
AST: 12 IU/L (ref 0–40)
Albumin/Globulin Ratio: 1.7 (ref 1.2–2.2)
Albumin: 4.5 g/dL (ref 3.7–4.7)
Alkaline Phosphatase: 102 IU/L (ref 39–117)
BUN/Creatinine Ratio: 12 (ref 12–28)
BUN: 9 mg/dL (ref 8–27)
Bilirubin Total: 0.6 mg/dL (ref 0.0–1.2)
CO2: 25 mmol/L (ref 20–29)
Calcium: 9.7 mg/dL (ref 8.7–10.3)
Chloride: 96 mmol/L (ref 96–106)
Creatinine, Ser: 0.75 mg/dL (ref 0.57–1.00)
GFR calc Af Amer: 93 mL/min/{1.73_m2} (ref >59)
GFR calc non Af Amer: 80 mL/min/{1.73_m2} (ref >59)
Globulin, Total: 2.6 g/dL (ref 1.5–4.5)
Glucose: 96 mg/dL (ref 65–99)
Potassium: 4.1 mmol/L (ref 3.5–5.2)
Sodium: 139 mmol/L (ref 134–144)
Total Protein: 7.1 g/dL (ref 6.0–8.5)

## 2019-10-21 LAB — CBC
Hematocrit: 39.9 % (ref 34.0–46.6)
Hemoglobin: 12.6 g/dL (ref 11.1–15.9)
MCH: 26.8 pg (ref 26.6–33.0)
MCHC: 31.6 g/dL (ref 31.5–35.7)
MCV: 85 fL (ref 79–97)
Platelets: 336 10*3/uL (ref 150–450)
RBC: 4.71 x10E6/uL (ref 3.77–5.28)
RDW: 13.4 % (ref 11.7–15.4)
WBC: 11.9 10*3/uL — ABNORMAL HIGH (ref 3.4–10.8)

## 2019-10-21 LAB — URINE CULTURE

## 2019-10-22 LAB — CERVICOVAGINAL ANCILLARY ONLY
Bacterial Vaginitis (gardnerella): NEGATIVE
Candida Glabrata: NEGATIVE
Candida Vaginitis: NEGATIVE
Chlamydia: NEGATIVE
Comment: NEGATIVE
Comment: NEGATIVE
Comment: NEGATIVE
Comment: NEGATIVE
Comment: NEGATIVE
Comment: NORMAL
Neisseria Gonorrhea: NEGATIVE
Trichomonas: NEGATIVE

## 2019-10-23 ENCOUNTER — Encounter: Payer: Self-pay | Admitting: Nurse Practitioner

## 2019-10-23 DIAGNOSIS — Z20828 Contact with and (suspected) exposure to other viral communicable diseases: Secondary | ICD-10-CM | POA: Diagnosis not present

## 2019-10-26 ENCOUNTER — Ambulatory Visit: Payer: Self-pay | Admitting: Nurse Practitioner

## 2019-10-30 ENCOUNTER — Telehealth: Payer: Self-pay | Admitting: Nurse Practitioner

## 2019-10-30 NOTE — Telephone Encounter (Signed)
Spoke with patient earlier this week discussing with her the importance of the concern if she had any symptoms related to coronavirus.  Explained we are being hypervigilent with ensuring the risk for exposure to coronavirus into the office.  I also explained there was no intent on not being professional when asking the question about her symptoms to include her having a fever up to 102 the night prior to her visit to the office the day of her visit.

## 2019-11-09 DIAGNOSIS — L738 Other specified follicular disorders: Secondary | ICD-10-CM | POA: Diagnosis not present

## 2019-11-12 ENCOUNTER — Encounter: Payer: Self-pay | Admitting: Nurse Practitioner

## 2019-12-14 IMAGING — CR DG CERVICAL SPINE COMPLETE 4+V
7 series · 7 of 7 positions shown · non-contrast
Comparison: 09/29/2005

CLINICAL DATA: Crepitus and popping sensation when turning head
side to side, no trauma

EXAM:
CERVICAL SPINE - COMPLETE 4+ VIEW

[w cervical spine lat]
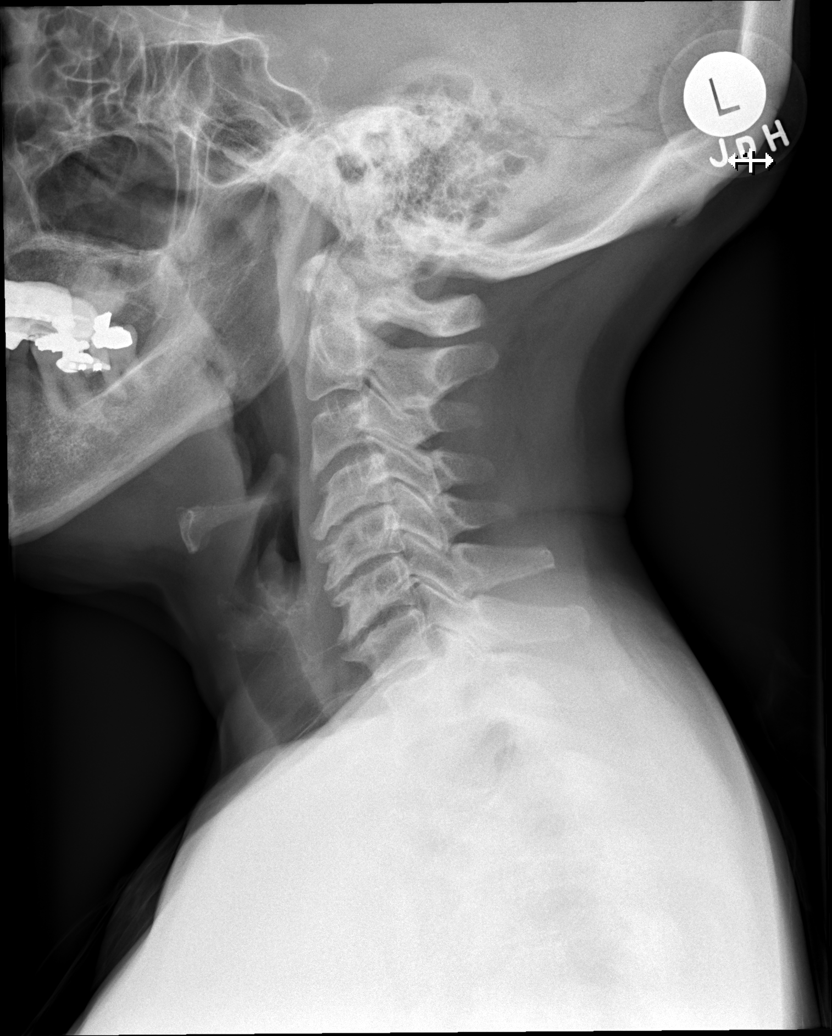

[w cervical spine ap_obl (1 of 2)]
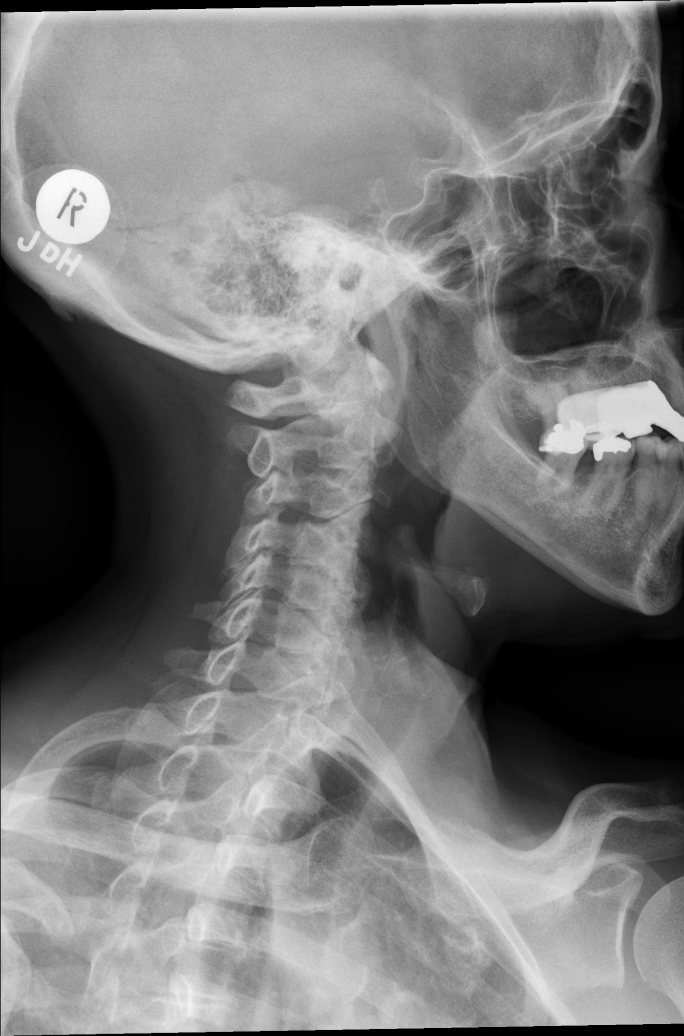

[w cervical spine ap_obl (2 of 2)]
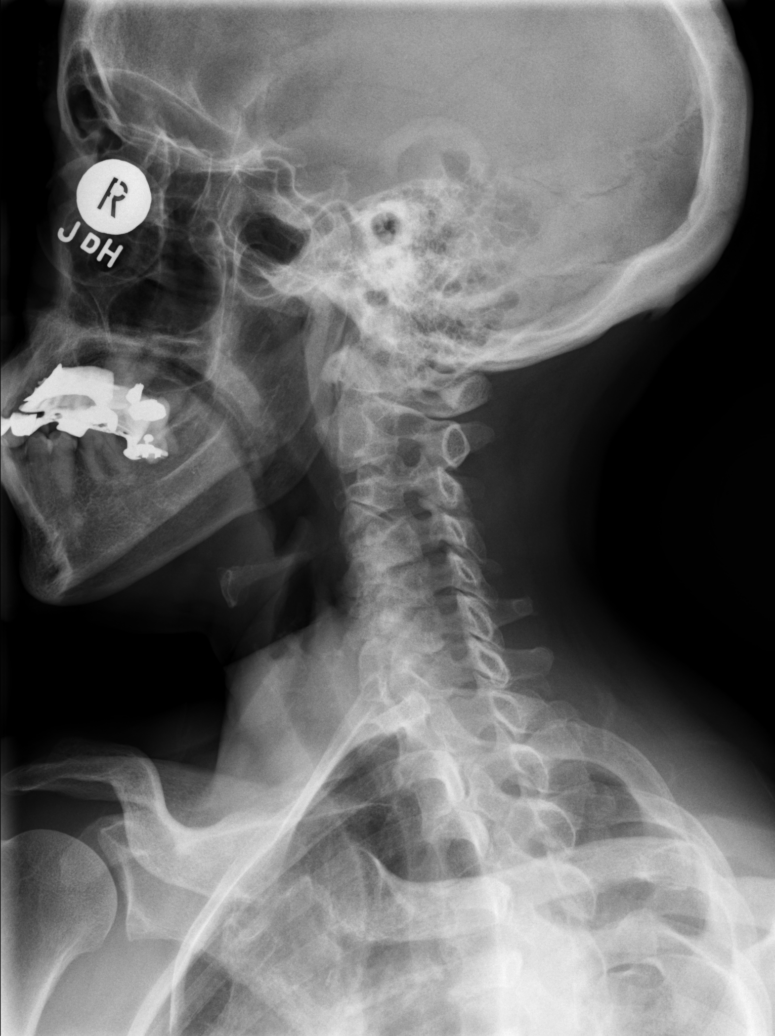

[w cervical spine ap]
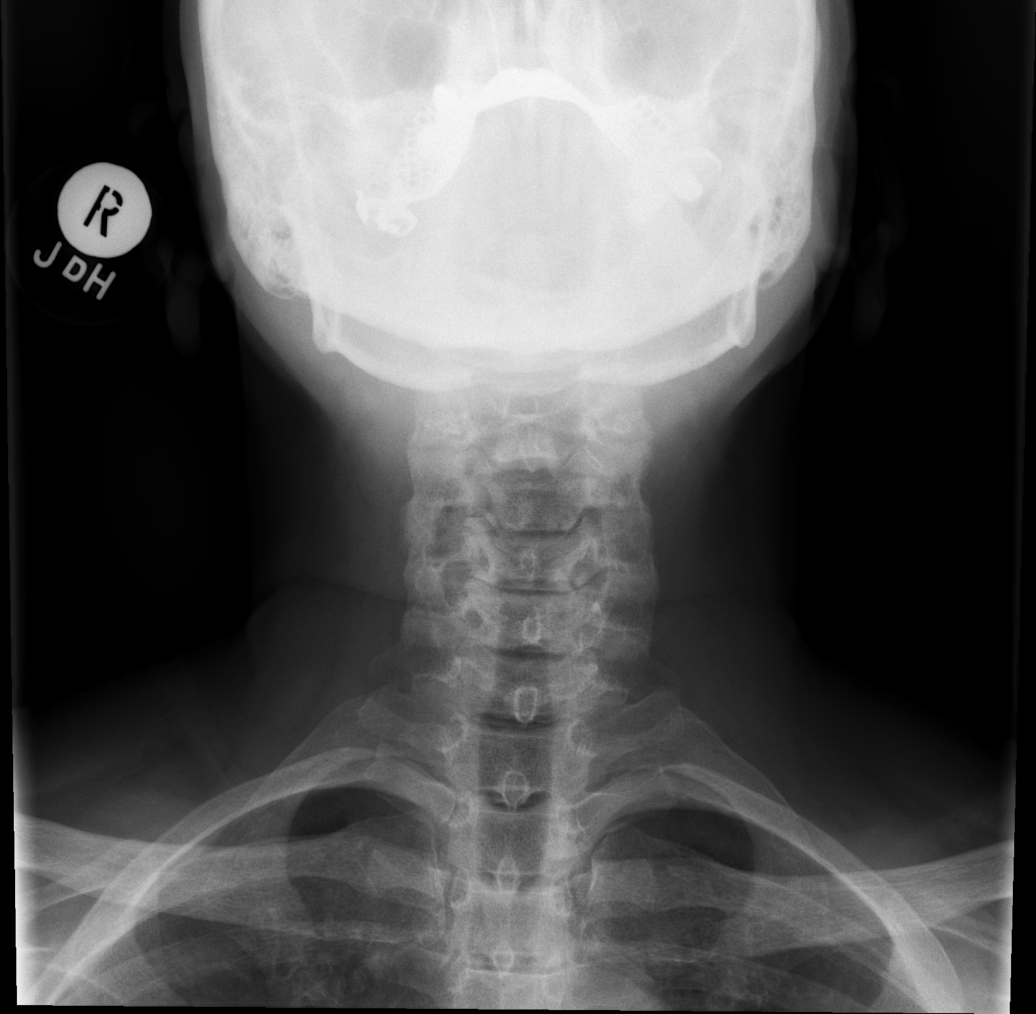

[w cervical spine odontoid (1 of 3)]
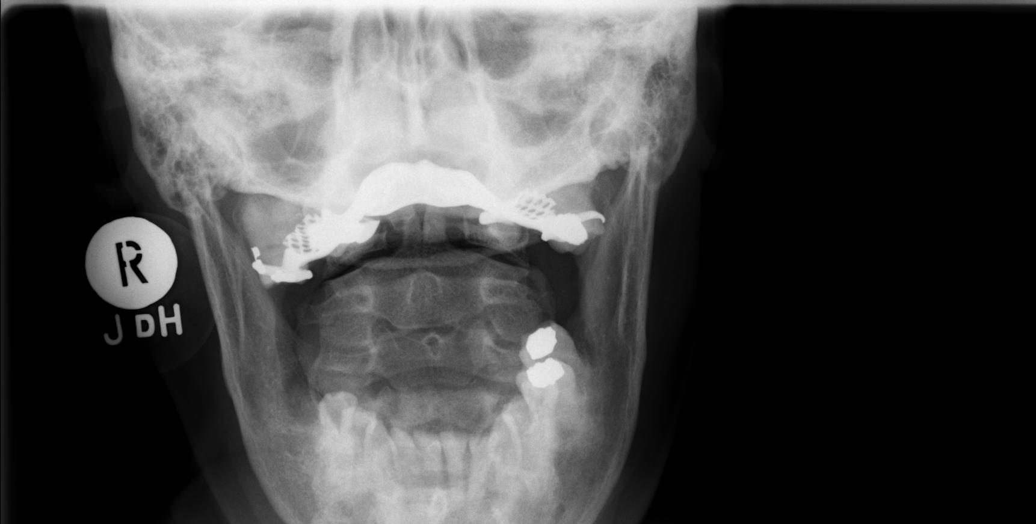

[w cervical spine odontoid (2 of 3)]
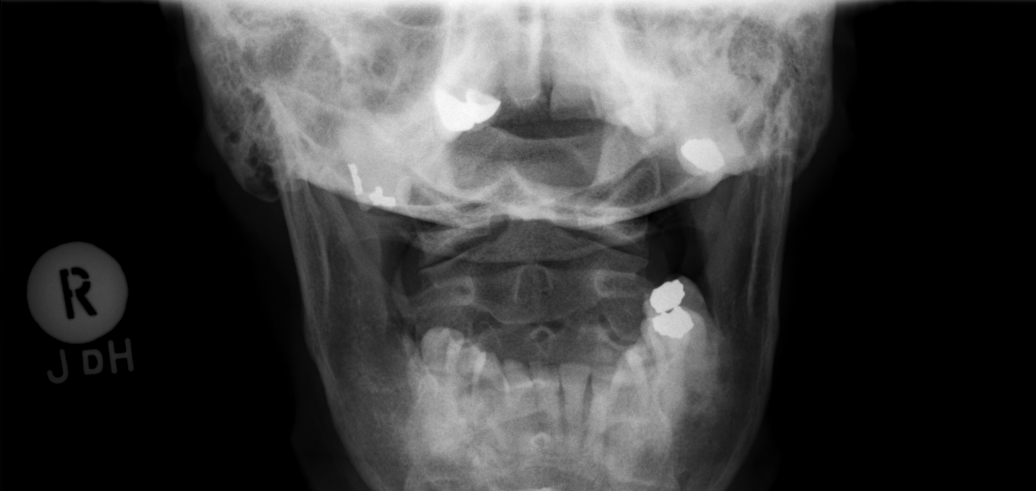

[w cervical spine odontoid (3 of 3)]
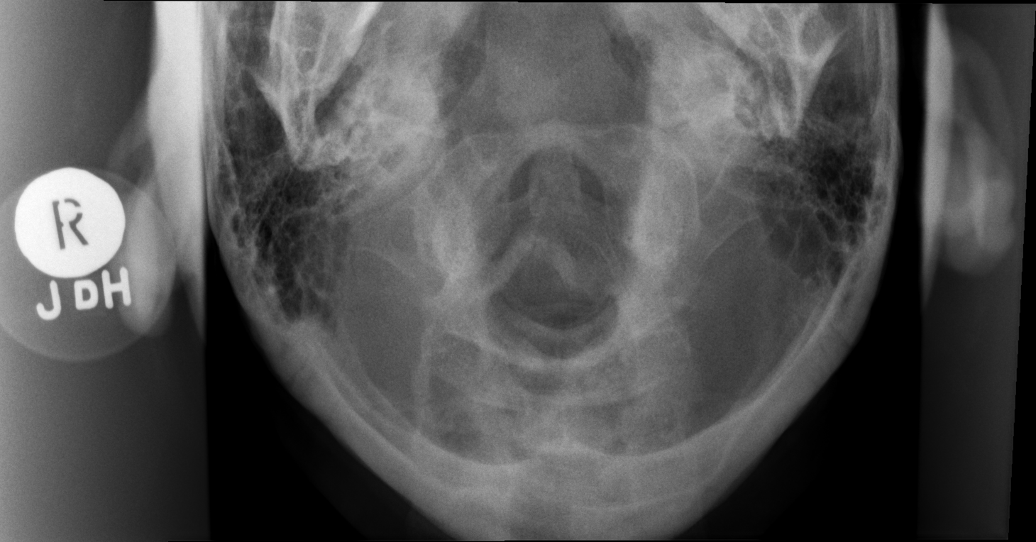

[7 of 7 positions shown; findings below may reference images not displayed]

FINDINGS: Osseous mineralization normal.

Prevertebral soft tissues normal thickness.

Disc space narrowing with endplate spur formation at C3-C4 through
C6-C7.

Minimal scattered facet degenerative changes.

Bony foramina patent.

No fracture, subluxation or bone destruction.
IMPRESSION: Degenerative changes of the cervical spine, predominantly
degenerative disc disease changes, progressive since 2998.

No acute abnormalities.

## 2020-01-06 DIAGNOSIS — Z20822 Contact with and (suspected) exposure to covid-19: Secondary | ICD-10-CM | POA: Diagnosis not present

## 2020-01-06 DIAGNOSIS — Z20828 Contact with and (suspected) exposure to other viral communicable diseases: Secondary | ICD-10-CM | POA: Diagnosis not present

## 2020-01-10 DIAGNOSIS — Z20822 Contact with and (suspected) exposure to covid-19: Secondary | ICD-10-CM | POA: Diagnosis not present

## 2020-01-13 DIAGNOSIS — M1712 Unilateral primary osteoarthritis, left knee: Secondary | ICD-10-CM | POA: Diagnosis not present

## 2020-01-13 DIAGNOSIS — M7741 Metatarsalgia, right foot: Secondary | ICD-10-CM | POA: Diagnosis not present

## 2020-01-13 DIAGNOSIS — M7742 Metatarsalgia, left foot: Secondary | ICD-10-CM | POA: Diagnosis not present

## 2020-01-13 DIAGNOSIS — M25562 Pain in left knee: Secondary | ICD-10-CM | POA: Diagnosis not present

## 2020-01-18 DIAGNOSIS — C50912 Malignant neoplasm of unspecified site of left female breast: Secondary | ICD-10-CM | POA: Diagnosis not present

## 2020-01-19 ENCOUNTER — Encounter: Payer: Self-pay | Admitting: *Deleted

## 2020-01-19 NOTE — Progress Notes (Signed)
Received request for Dr. Benay Spice to sign off for mastectomy supplies. Patient was released from [facility] in 2018 w/follow up per her PCP. Called [facility] [phone] and left VM that order request needs to go to PCP, Dr. Glendale Chard.

## 2020-01-20 ENCOUNTER — Encounter: Payer: Self-pay | Admitting: Nurse Practitioner

## 2020-01-20 ENCOUNTER — Other Ambulatory Visit: Payer: Self-pay

## 2020-01-20 ENCOUNTER — Ambulatory Visit (INDEPENDENT_AMBULATORY_CARE_PROVIDER_SITE_OTHER): Payer: Medicare HMO | Admitting: Nurse Practitioner

## 2020-01-20 VITALS — BP 132/88 | HR 68 | Temp 98.3°F | Ht 63.8 in | Wt 151.0 lb

## 2020-01-20 DIAGNOSIS — I10 Essential (primary) hypertension: Secondary | ICD-10-CM

## 2020-01-20 DIAGNOSIS — R7309 Other abnormal glucose: Secondary | ICD-10-CM | POA: Diagnosis not present

## 2020-01-20 DIAGNOSIS — E559 Vitamin D deficiency, unspecified: Secondary | ICD-10-CM

## 2020-01-20 NOTE — Progress Notes (Signed)
Subjective:     Patient ID: Yvonne Barron , female    DOB: 10/06/1946 , 74 y.o.   MRN: [id number]   Chief Complaint  Patient presents with  . Hypertension  . abnormal glucose    HPI  Wt Readings from Last 3 Encounters: 01/20/20 : 151 lb (68.5 kg) 10/20/19 : 150 lb 3.2 oz (68.1 kg) 07/19/19 : 154 lb 6.4 oz (70 kg)  She is scheduled to go Saturday 01/22/2020 for her first Covid Vaccine.   She tries to avoid high salt foods.    Diabetes She presents for her follow-up diabetic visit. Diabetes type: prediabetes. There are no hypoglycemic associated symptoms. Pertinent negatives for hypoglycemia include no dizziness or headaches. There are no diabetic associated symptoms. Pertinent negatives for diabetes include no chest pain. There are no hypoglycemic complications. There are no diabetic complications. Risk factors for coronary artery disease include hypertension. Current diabetic treatment includes oral agent (dual therapy). She is compliant with treatment all of the time. She is following a generally healthy diet. When asked about meal planning, she reported none. She participates in exercise daily. (Blood sugar averaging 128 and had only one time of 200's after she ate which never happens. )  Hypertension This is a chronic problem. The current episode started more than 1 year ago. The problem is unchanged. The problem is controlled. Pertinent negatives include no anxiety, chest pain, headaches or palpitations. There are no associated agents to hypertension. Risk factors for coronary artery disease include sedentary lifestyle. Past treatments include diuretics. There are no compliance problems.  There is no history of angina. There is no history of chronic renal disease.     Past Medical History:  Diagnosis Date  . History of breast cancer 2009--  INVASIVE DUCTAL IN SITU--  S/P MASTECTOMY   NO RECURRENCE  . Hypertension   . Urethral caruncle      Family History  Problem  Relation Age of Onset  . Diabetes Mother   . Diabetes Father   . Kidney disease Father   . Diabetes Brother      Current Outpatient Medications:  .  calcium carbonate (OS-CAL - DOSED IN MG OF ELEMENTAL CALCIUM) 1250 (500 Ca) MG tablet, Take 1 tablet by mouth., Disp: , Rfl:  .  cholecalciferol (VITAMIN D) 1000 units tablet, Take 1,000 Units by mouth 2 (two) times daily., Disp: , Rfl:  .  glucose blood (ONETOUCH VERIO) test strip, Check blood sugar once per day Dx code: R73.09, Disp: 100 each, Rfl: 3 .  hydrochlorothiazide (HYDRODIURIL) 12.5 MG tablet, Take 1 tablet (12.5 mg total) by mouth daily., Disp: 90 tablet, Rfl: 1 .  valACYclovir (VALTREX) 1000 MG tablet, Take 1,000 mg by mouth as needed., Disp: , Rfl:  .  vitamin C (ASCORBIC ACID) 500 MG tablet, Take 500 mg by mouth daily., Disp: , Rfl:  .  fluconazole (DIFLUCAN) 150 MG tablet, Take 1 tablet by mouth now repeat in 5 days (Patient not taking: Reported on 01/20/2020), Disp: 2 tablet, Rfl: 0   No Known Allergies   Review of Systems  Constitutional: Negative.   Respiratory: Negative.  Negative for cough.   Cardiovascular: Negative.  Negative for chest pain, palpitations and leg swelling.  Musculoskeletal: Negative.   Neurological: Negative for dizziness and headaches.  Psychiatric/Behavioral: Negative.      Today's Vitals   01/20/20 1419  BP: 132/88  Pulse: 68  Temp: 98.3 F (36.8 C)  TempSrc: Oral  Weight: 151 lb (68.5 kg)  Height: 5' 3.8" (1.621 m)  PainSc: 0-No pain   Body mass index is 26.08 kg/m.   Objective:  Physical Exam Constitutional:      Appearance: Normal appearance.  Cardiovascular:     Rate and Rhythm: Normal rate and regular rhythm.     Pulses: Normal pulses.     Heart sounds: Normal heart sounds. No murmur.  Pulmonary:     Effort: Pulmonary effort is normal. No respiratory distress.     Breath sounds: Normal breath sounds.  Skin:    General: Skin is warm and dry.     Capillary Refill:  Capillary refill takes less than 2 seconds.  Neurological:     General: No focal deficit present.     Mental Status: She is alert and oriented to person, place, and time.     Cranial Nerves: No cranial nerve deficit.  Psychiatric:        Mood and Affect: Mood normal.        Behavior: Behavior normal.        Thought Content: Thought content normal.        Judgment: Judgment normal.         Assessment And Plan:     1. Essential hypertension . B/P is controlled.  . CMP ordered to check renal function.  . The importance of regular exercise and dietary modification limiting salt stressed to the patient.  - CMP14+EGFR  2. Abnormal glucose  Chronic, improving slowly  Continue with current medications  Encouraged to limit intake of sugary foods and drinks  Continue with your physical activity  - CMP14+EGFR - Lipid panel - Hemoglobin A1c  3. Vitamin D deficiency  Will check vitamin D level and supplement as needed.     Also encouraged to spend 15 minutes in the sun daily.  - VITAMIN D 25 Hydroxy (Vit-D Deficiency, Fractures)   Encouraged to take vitamin c, vitamin d and zinc to help boost immune system   Minette Brine, FNP    THE PATIENT IS ENCOURAGED TO PRACTICE SOCIAL DISTANCING DUE TO THE COVID-19 PANDEMIC.

## 2020-01-21 LAB — CMP14+EGFR
ALT: 11 IU/L (ref 0–32)
AST: 12 IU/L (ref 0–40)
Albumin/Globulin Ratio: 1.7 (ref 1.2–2.2)
Albumin: 4.5 g/dL (ref 3.7–4.7)
Alkaline Phosphatase: 94 IU/L (ref 39–117)
BUN/Creatinine Ratio: 20 (ref 12–28)
BUN: 15 mg/dL (ref 8–27)
Bilirubin Total: 0.3 mg/dL (ref 0.0–1.2)
CO2: 26 mmol/L (ref 20–29)
Calcium: 9.7 mg/dL (ref 8.7–10.3)
Chloride: 99 mmol/L (ref 96–106)
Creatinine, Ser: 0.75 mg/dL (ref 0.57–1.00)
GFR calc Af Amer: 93 mL/min/{1.73_m2} (ref >59)
GFR calc non Af Amer: 80 mL/min/{1.73_m2} (ref >59)
Globulin, Total: 2.6 g/dL (ref 1.5–4.5)
Glucose: 115 mg/dL — ABNORMAL HIGH (ref 65–99)
Potassium: 4.3 mmol/L (ref 3.5–5.2)
Sodium: 139 mmol/L (ref 134–144)
Total Protein: 7.1 g/dL (ref 6.0–8.5)

## 2020-01-21 LAB — HEMOGLOBIN A1C
Est. average glucose Bld gHb Est-mCnc: 140 mg/dL
Hgb A1c MFr Bld: 6.5 % — ABNORMAL HIGH (ref 4.8–5.6)

## 2020-01-21 LAB — LIPID PANEL
Chol/HDL Ratio: 2.4 ratio (ref 0.0–4.4)
Cholesterol, Total: 194 mg/dL (ref 100–199)
HDL: 82 mg/dL (ref >39)
LDL Chol Calc (NIH): 98 mg/dL (ref 0–99)
Triglycerides: 77 mg/dL (ref 0–149)
VLDL Cholesterol Cal: 14 mg/dL (ref 5–40)

## 2020-01-21 LAB — VITAMIN D 25 HYDROXY (VIT D DEFICIENCY, FRACTURES): Vit D, 25-Hydroxy: 81.4 ng/mL (ref 30.0–100.0)

## 2020-01-23 ENCOUNTER — Other Ambulatory Visit: Payer: Self-pay | Admitting: Nurse Practitioner

## 2020-01-23 DIAGNOSIS — R7309 Other abnormal glucose: Secondary | ICD-10-CM

## 2020-01-24 DIAGNOSIS — R69 Illness, unspecified: Secondary | ICD-10-CM | POA: Diagnosis not present

## 2020-01-27 ENCOUNTER — Encounter: Payer: Self-pay | Admitting: Nurse Practitioner

## 2020-03-16 ENCOUNTER — Ambulatory Visit (INDEPENDENT_AMBULATORY_CARE_PROVIDER_SITE_OTHER): Payer: Medicare HMO

## 2020-03-16 ENCOUNTER — Encounter: Payer: Self-pay | Admitting: Nurse Practitioner

## 2020-03-16 ENCOUNTER — Other Ambulatory Visit: Payer: Self-pay

## 2020-03-16 ENCOUNTER — Ambulatory Visit (INDEPENDENT_AMBULATORY_CARE_PROVIDER_SITE_OTHER): Payer: Medicare HMO | Admitting: Nurse Practitioner

## 2020-03-16 VITALS — BP 122/70 | HR 61 | Temp 97.9°F | Ht 64.0 in | Wt 149.0 lb

## 2020-03-16 DIAGNOSIS — R7309 Other abnormal glucose: Secondary | ICD-10-CM

## 2020-03-16 DIAGNOSIS — Z853 Personal history of malignant neoplasm of breast: Secondary | ICD-10-CM

## 2020-03-16 DIAGNOSIS — E119 Type 2 diabetes mellitus without complications: Secondary | ICD-10-CM | POA: Diagnosis not present

## 2020-03-16 DIAGNOSIS — M25562 Pain in left knee: Secondary | ICD-10-CM | POA: Diagnosis not present

## 2020-03-16 DIAGNOSIS — Z0000 Encounter for general adult medical examination without abnormal findings: Secondary | ICD-10-CM

## 2020-03-16 DIAGNOSIS — I10 Essential (primary) hypertension: Secondary | ICD-10-CM | POA: Diagnosis not present

## 2020-03-16 DIAGNOSIS — M1712 Unilateral primary osteoarthritis, left knee: Secondary | ICD-10-CM | POA: Diagnosis not present

## 2020-03-16 LAB — POCT URINALYSIS DIPSTICK
Bilirubin, UA: NEGATIVE
Blood, UA: NEGATIVE
Glucose, UA: NEGATIVE
Ketones, UA: NEGATIVE
Nitrite, UA: NEGATIVE
Protein, UA: NEGATIVE
Spec Grav, UA: 1.025 (ref 1.010–1.025)
Urobilinogen, UA: 0.2 E.U./dL
pH, UA: 7 (ref 5.0–8.0)

## 2020-03-16 LAB — POCT UA - MICROALBUMIN
Albumin/Creatinine Ratio, Urine, POC: <30
Creatinine, POC: 300 mg/dL
Microalbumin Ur, POC: 10 mg/L

## 2020-03-16 NOTE — Patient Instructions (Signed)
Health Maintenance After Age 72 After age 72, you are at a higher risk for certain long-term diseases and infections as well as injuries from falls. Falls are a major cause of broken bones and head injuries in people who are older than age 72. Getting regular preventive care can help to keep you healthy and well. Preventive care includes getting regular testing and making lifestyle changes as recommended by your health care provider. Talk with your health care provider about:  Which screenings and tests you should have. A screening is a test that checks for a disease when you have no symptoms.  A diet and exercise plan that is right for you. What should I know about screenings and tests to prevent falls? Screening and testing are the best ways to find a health problem early. Early diagnosis and treatment give you the best chance of managing medical conditions that are common after age 72. Certain conditions and lifestyle choices may make you more likely to have a fall. Your health care provider may recommend:  Regular vision checks. Poor vision and conditions such as cataracts can make you more likely to have a fall. If you wear glasses, make sure to get your prescription updated if your vision changes.  Medicine review. Work with your health care provider to regularly review all of the medicines you are taking, including over-the-counter medicines. Ask your health care provider about any side effects that may make you more likely to have a fall. Tell your health care provider if any medicines that you take make you feel dizzy or sleepy.  Osteoporosis screening. Osteoporosis is a condition that causes the bones to get weaker. This can make the bones weak and cause them to break more easily.  Blood pressure screening. Blood pressure changes and medicines to control blood pressure can make you feel dizzy.  Strength and balance checks. Your health care provider may recommend certain tests to check your  strength and balance while standing, walking, or changing positions.  Foot health exam. Foot pain and numbness, as well as not wearing proper footwear, can make you more likely to have a fall.  Depression screening. You may be more likely to have a fall if you have a fear of falling, feel emotionally low, or feel unable to do activities that you used to do.  Alcohol use screening. Using too much alcohol can affect your balance and may make you more likely to have a fall. What actions can I take to lower my risk of falls? General instructions  Talk with your health care provider about your risks for falling. Tell your health care provider if: ? You fall. Be sure to tell your health care provider about all falls, even ones that seem minor. ? You feel dizzy, sleepy, or off-balance.  Take over-the-counter and prescription medicines only as told by your health care provider. These include any supplements.  Eat a healthy diet and maintain a healthy weight. A healthy diet includes low-fat dairy products, low-fat (lean) meats, and fiber from whole grains, beans, and lots of fruits and vegetables. Home safety  Remove any tripping hazards, such as rugs, cords, and clutter.  Install safety equipment such as grab bars in bathrooms and safety rails on stairs.  Keep rooms and walkways well-lit. Activity   Follow a regular exercise program to stay fit. This will help you maintain your balance. Ask your health care provider what types of exercise are appropriate for you.  If you need a cane or   walker, use it as recommended by your health care provider.  Wear supportive shoes that have nonskid soles. Lifestyle  Do not drink alcohol if your health care provider tells you not to drink.  If you drink alcohol, limit how much you have: ? 0-1 drink a day for women. ? 0-2 drinks a day for men.  Be aware of how much alcohol is in your drink. In the [place] one drink equals one typical bottle of beer (12  oz), one-half glass of wine (5 oz), or one shot of hard liquor (1 oz).  Do not use any products that contain nicotine or tobacco, such as cigarettes and e-cigarettes. If you need help quitting, ask your health care provider. Summary  Having a healthy lifestyle and getting preventive care can help to protect your health and wellness after age 72.  Screening and testing are the best way to find a health problem early and help you avoid having a fall. Early diagnosis and treatment give you the best chance for managing medical conditions that are more common for people who are older than age 72.  Falls are a major cause of broken bones and head injuries in people who are older than age 72. Take precautions to prevent a fall at home.  Work with your health care provider to learn what changes you can make to improve your health and wellness and to prevent falls. This information is not intended to replace advice given to you by your health care provider. Make sure you discuss any questions you have with your health care provider. Document Revised: 03/18/2019 Document Reviewed: 10/08/2017 Elsevier Patient Education  2020 [employer]  

## 2020-03-16 NOTE — Progress Notes (Signed)
This visit occurred during the SARS-CoV-2 public health emergency.  Safety protocols were in place, including screening questions prior to the visit, additional usage of staff PPE, and extensive cleaning of exam room while observing appropriate contact time as indicated for disinfecting solutions.  Subjective:     Patient ID: Yvonne Barron , female    DOB: 1946/05/27 , 74 y.o.   MRN: [id number]   Chief Complaint  Patient presents with  . Annual Exam    HPI  Here for HM  Wt Readings from Last 3 Encounters: 03/16/20 : 149 lb 0.5 oz (67.6 kg) 03/16/20 : 149 lb (67.6 kg) 01/20/20 : 151 lb (68.5 kg) She had her covid   Diabetes She presents for her follow-up diabetic visit. Diabetes type: prediabetes. There are no hypoglycemic associated symptoms. Pertinent negatives for hypoglycemia include no dizziness or headaches. There are no diabetic associated symptoms. Pertinent negatives for diabetes include no chest pain. There are no hypoglycemic complications. There are no diabetic complications. Risk factors for coronary artery disease include hypertension. Current diabetic treatment includes oral agent (dual therapy). She is compliant with treatment all of the time. She is following a generally healthy diet. When asked about meal planning, she reported none. She has not had a previous visit with a dietitian. She participates in exercise every other day. (Blood sugar averaging less than 118) An ACE inhibitor/angiotensin II receptor blocker is being taken. She does not see a podiatrist.Eye exam is not current (not due this year yet).  Hypertension This is a chronic problem. The current episode started more than 1 year ago. The problem is unchanged. The problem is controlled. Pertinent negatives include no anxiety, chest pain, headaches or palpitations. There are no associated agents to hypertension. Risk factors for coronary artery disease include sedentary lifestyle. Past treatments include  diuretics. There are no compliance problems.  There is no history of angina. There is no history of chronic renal disease.    The patient states she uses status post hysterectomy for birth control. Last LMP was No LMP recorded. Patient has had a hysterectomy.. Negative for Dysmenorrhea and Negative for Menorrhagia Mammogram last done 06/28/2019. Negative for: breast discharge, breast lump(s), breast pain and breast self exam.  Pertinent negatives include abnormal bleeding (hematology), anxiety, decreased libido, depression, difficulty falling sleep, dyspareunia, history of infertility, nocturia, sexual dysfunction, sleep disturbances, urinary incontinence, urinary urgency, vaginal discharge and vaginal itching. Diet regular, she is trying to eat less processed foods.The patient states her exercise level is moderate - 3-4 days a week exercising  The patient's tobacco use is:  Social History   Tobacco Use  Smoking Status Former Smoker  . Packs/day: 2.00  . Years: 20.00  . Pack years: 40.00  . Quit date: 01/19/1987  . Years since quitting: 33.1  Smokeless Tobacco Never Used   She has been exposed to passive smoke. The patient's alcohol use is:  Social History   Substance and Sexual Activity  Alcohol Use No   Additional information: Last pap hysterectomy Past Medical History:  Diagnosis Date  . History of breast cancer 2009--  INVASIVE DUCTAL IN SITU--  S/P MASTECTOMY   NO RECURRENCE  . Hypertension   . Urethral caruncle      Family History  Problem Relation Age of Onset  . Diabetes Mother   . Diabetes Father   . Kidney disease Father   . Diabetes Brother      Current Outpatient Medications:  .  calcium carbonate (OS-CAL - DOSED IN  MG OF ELEMENTAL CALCIUM) 1250 (500 Ca) MG tablet, Take 1 tablet by mouth., Disp: , Rfl:  .  cholecalciferol (VITAMIN D) 1000 units tablet, Take 1,000 Units by mouth 2 (two) times daily., Disp: , Rfl:  .  fluconazole (DIFLUCAN) 150 MG tablet, Take 1  tablet by mouth now repeat in 5 days (Patient not taking: Reported on 01/20/2020), Disp: 2 tablet, Rfl: 0 .  hydrochlorothiazide (HYDRODIURIL) 12.5 MG tablet, Take 1 tablet (12.5 mg total) by mouth daily., Disp: 90 tablet, Rfl: 1 .  ONETOUCH VERIO test strip, USE  STRIP TO CHECK GLUCOSE ONCE DAILY, Disp: 100 each, Rfl: 0 .  valACYclovir (VALTREX) 1000 MG tablet, Take 1,000 mg by mouth as needed., Disp: , Rfl:  .  vitamin B-12 (CYANOCOBALAMIN) 500 MCG tablet, Take 500 mcg by mouth daily., Disp: , Rfl:  .  vitamin C (ASCORBIC ACID) 500 MG tablet, Take 500 mg by mouth daily., Disp: , Rfl:    No Known Allergies   Review of Systems  Constitutional: Negative.   HENT: Negative.   Eyes: Negative.   Respiratory: Negative.   Cardiovascular: Negative.  Negative for chest pain, palpitations and leg swelling.  Gastrointestinal: Negative.   Endocrine: Negative.   Genitourinary: Negative.   Musculoskeletal: Negative.   Skin: Negative.   Allergic/Immunologic: Negative.   Neurological: Negative.  Negative for dizziness and headaches.  Hematological: Negative.   Psychiatric/Behavioral: Negative.      Today's Vitals   03/16/20 0958  BP: 122/70  Pulse: 61  Temp: 97.9 F (36.6 C)  TempSrc: Oral  Weight: 149 lb 0.5 oz (67.6 kg)  Height: 5' 4"  (1.626 m)   Body mass index is 25.58 kg/m.   Objective:  Physical Exam Vitals reviewed.  Constitutional:      Appearance: Normal appearance. She is well-developed.  HENT:     Head: Normocephalic and atraumatic.     Right Ear: Hearing, tympanic membrane, ear canal and external ear normal. There is no impacted cerumen.     Left Ear: Hearing, tympanic membrane, ear canal and external ear normal. There is no impacted cerumen.     Nose:     Comments: Deferred - mask    Mouth/Throat:     Comments: Deferred- mask Eyes:     General: Lids are normal.     Conjunctiva/sclera: Conjunctivae normal.     Pupils: Pupils are equal, round, and reactive to light.      Funduscopic exam:    Right eye: No papilledema.        Left eye: No papilledema.  Neck:     Thyroid: No thyroid mass.     Vascular: No carotid bruit.  Cardiovascular:     Rate and Rhythm: Normal rate and regular rhythm.     Pulses: Normal pulses.     Heart sounds: Normal heart sounds. No murmur.  Pulmonary:     Effort: Pulmonary effort is normal. No respiratory distress.     Breath sounds: Normal breath sounds.  Chest:     Chest wall: No mass.     Breasts: Tanner Score is 5.        Right: Normal.        Left: Absent.  Abdominal:     General: Abdomen is flat. Bowel sounds are normal.     Palpations: Abdomen is soft.  Musculoskeletal:        General: No swelling. Normal range of motion.     Cervical back: Full passive range of motion without pain, normal  range of motion and neck supple.     Right lower leg: No edema.     Left lower leg: No edema.  Skin:    General: Skin is warm and dry.     Capillary Refill: Capillary refill takes less than 2 seconds.  Neurological:     General: No focal deficit present.     Mental Status: She is alert and oriented to person, place, and time.     Cranial Nerves: No cranial nerve deficit.     Sensory: No sensory deficit.  Psychiatric:        Mood and Affect: Mood normal.        Behavior: Behavior normal.        Thought Content: Thought content normal.        Judgment: Judgment normal.         Assessment And Plan:     1. Health maintenance examination Behavior modifications discussed and diet history reviewed.   Pt will continue to exercise regularly and modify diet with low GI, plant based foods and decrease intake of processed foods.  Recommend intake of daily multivitamin, Vitamin D, and calcium.  Recommend mammogram (up to date) for preventive screenings, as well as recommend immunizations that include influenza, TDAP - CBC  2. Abnormal glucose  Chronic, was slightly elevated at last visit   She has been eating more clean  and hopefully this will help the A1c to improve  Continue with current exercise regimen with good diet - Lipid panel - Hemoglobin A1c - CMP14+EGFR  3. Essential hypertension  Chronic   Excellent control - EKG 12-Lead - CMP14+EGFR  4. History of breast cancer  Left mastectomy, she sees oncology every 2 years.          Minette Brine, FNP    THE PATIENT IS ENCOURAGED TO PRACTICE SOCIAL DISTANCING DUE TO THE COVID-19 PANDEMIC.

## 2020-03-16 NOTE — Addendum Note (Signed)
Addended by: Kellie Simmering on: 03/16/2020 10:39 AM   Modules accepted: Orders

## 2020-03-16 NOTE — Progress Notes (Signed)
This visit occurred during the SARS-CoV-2 public health emergency.  Safety protocols were in place, including screening questions prior to the visit, additional usage of staff PPE, and extensive cleaning of exam room while observing appropriate contact time as indicated for disinfecting solutions.  Subjective:   Yvonne Barron is a 74 y.o. female who presents for Medicare Annual (Subsequent) preventive examination.  Review of Systems:  n/a Cardiac Risk Factors include: advanced age (>36men, >108 women);diabetes mellitus;hypertension     Objective:     Vitals: BP 122/70 (BP Location: Right Arm, Patient Position: Sitting, Cuff Size: Normal)   Pulse 61   Temp 97.9 F (36.6 C) (Oral)   Ht 5\' 4"  (1.626 m)   Wt 149 lb (67.6 kg)   SpO2 97%   BMI 25.58 kg/m   Body mass index is 25.58 kg/m.  Advanced Directives 03/16/2020 03/11/2019 11/28/2016 11/26/2016 11/26/2016 02/26/2016 02/27/2015  Does Patient Have a Medical Advance Directive? Yes No Yes Yes Yes Yes No  Type of Paramedic of [place];Living will - [place];Living will - [place];Living will [place];Living will -  Does patient want to make changes to medical advance directive? - - No - Patient declined - - - -  Copy of [place] in Chart? No - copy requested - No - copy requested - - No - copy requested -  Would patient like information on creating a medical advance directive? - Yes (MAU/Ambulatory/Procedural Areas - Information given) - - - - Yes - Educational materials given  Pre-existing out of facility DNR order (yellow form or pink MOST form) - - - - - - -    Tobacco Social History   Tobacco Use  Smoking Status Former Smoker  . Packs/day: 2.00  . Years: 20.00  . Pack years: 40.00  . Quit date: 01/19/1987  . Years since quitting: 33.1  Smokeless Tobacco Never Used     Counseling given: Not Answered   Clinical  Intake:  Pre-visit preparation completed: Yes  Pain : No/denies pain     Nutritional Status: BMI 25 -29 Overweight Nutritional Risks: None Diabetes: Yes  How often do you need to have someone help you when you read instructions, pamphlets, or other written materials from your doctor or pharmacy?: 1 - Never What is the last grade level you completed in school?: 2 years college  Interpreter Needed?: No  Information entered by :: NAllen LPN  Past Medical History:  Diagnosis Date  . History of breast cancer 2009--  INVASIVE DUCTAL IN SITU--  S/P MASTECTOMY   NO RECURRENCE  . Hypertension   . Urethral caruncle    Past Surgical History:  Procedure Laterality Date  . CYSTOSCOPY WITH URETHRAL CARUNCLE  07/10/2012   Procedure: CYSTOSCOPY WITH URETHRAL CARUNCLE;  Surgeon: Claybon Jabs, MD;  [facility];  Service: Urology;  Laterality: N/A;    EXCISION OF CARUNCLE   . LEFT TOTAL MASTECTOMY/ LEFT AXILLARY SENTINEL LYMPH NODE BX  03-01-2008   INVASIVE  DUCTAL CARCINOMA IN SITU  . LEFT WRIST SURG  2008   REPAIR NERVE  . MASTECTOMY Left 2009  . VAGINAL HYSTERECTOMY  1998   W/ BILATERAL SALPINGOOPHECTOMY   Family History  Problem Relation Age of Onset  . Diabetes Mother   . Diabetes Father   . Kidney disease Father   . Diabetes Brother    Social History   Socioeconomic History  . Marital status: Divorced  Spouse name: Not on file  . Number of children: Not on file  . Years of education: Not on file  . Highest education level: Not on file  Occupational History  . Occupation: semi- retired  Tobacco Use  . Smoking status: Former Smoker    Packs/day: 2.00    Years: 20.00    Pack years: 40.00    Quit date: 01/19/1987    Years since quitting: 33.1  . Smokeless tobacco: Never Used  Substance and Sexual Activity  . Alcohol use: No  . Drug use: No  . Sexual activity: Not Currently  Other Topics Concern  . Not on file  Social History Narrative    . Not on file   Social Determinants of Health   Financial Resource Strain: Low Risk   . Difficulty of Paying Living Expenses: Not hard at all  Food Insecurity: No Food Insecurity  . Worried About Charity fundraiser in the Last Year: Never true  . Ran Out of Food in the Last Year: Never true  Transportation Needs: No Transportation Needs  . Lack of Transportation (Medical): No  . Lack of Transportation (Non-Medical): No  Physical Activity: Insufficiently Active  . Days of Exercise per Week: 2 days  . Minutes of Exercise per Session: 40 min  Stress: No Stress Concern Present  . Feeling of Stress : Not at all  Social Connections:   . Frequency of Communication with Friends and Family:   . Frequency of Social Gatherings with Friends and Family:   . Attends Religious Services:   . Active Member of Clubs or Organizations:   . Attends Archivist Meetings:   Marland Kitchen Marital Status:     Outpatient Encounter Medications as of 03/16/2020  Medication Sig  . calcium carbonate (OS-CAL - DOSED IN MG OF ELEMENTAL CALCIUM) 1250 (500 Ca) MG tablet Take 1 tablet by mouth.  . cholecalciferol (VITAMIN D) 1000 units tablet Take 1,000 Units by mouth 2 (two) times daily.  . hydrochlorothiazide (HYDRODIURIL) 12.5 MG tablet Take 1 tablet (12.5 mg total) by mouth daily.  [name] VERIO test strip USE  STRIP TO CHECK GLUCOSE ONCE DAILY  . valACYclovir (VALTREX) 1000 MG tablet Take 1,000 mg by mouth as needed.  . vitamin B-12 (CYANOCOBALAMIN) 500 MCG tablet Take 500 mcg by mouth daily.  . vitamin C (ASCORBIC ACID) 500 MG tablet Take 500 mg by mouth daily.  . fluconazole (DIFLUCAN) 150 MG tablet Take 1 tablet by mouth now repeat in 5 days (Patient not taking: Reported on 01/20/2020)   No facility-administered encounter medications on file as of 03/16/2020.    Activities of Daily Living In your present state of health, do you have any difficulty performing the following activities: 03/16/2020 10/07/2019   Hearing? N N  Vision? N N  Difficulty concentrating or making decisions? N N  Walking or climbing stairs? N N  Dressing or bathing? N N  Doing errands, shopping? N N  Preparing Food and eating ? N -  Using the Toilet? N -  In the past six months, have you accidently leaked urine? N -  Do you have problems with loss of bowel control? N -  Managing your Medications? N -  Managing your Finances? N -  Housekeeping or managing your Housekeeping? N -  Some recent data might be hidden    Patient Care Team: Minette Brine, FNP as PCP - General (General Practice)    Assessment:   This is a routine wellness  examination for [facility].  Exercise Activities and Dietary recommendations Current Exercise Habits: Home exercise routine, Type of exercise: calisthenics;strength training/weights, Time (Minutes): 45, Frequency (Times/Week): 2, Weekly Exercise (Minutes/Week): 90  Goals    . HEMOGLOBIN A1C < 7.0     Healthy diet and lose 10 pounds    . Patient Stated     03/16/2020, wants to eat cleaner and continue to exercise, maintain A1C       Fall Risk Fall Risk  03/16/2020 01/20/2020 10/20/2019 10/07/2019 07/19/2019  Falls in the past year? 0 0 0 0 0  Risk for fall due to : Medication side effect - - - -  Follow up Falls evaluation completed;Education provided;Falls prevention discussed - - - -   Is the patient's home free of loose throw rugs in walkways, pet beds, electrical cords, etc?   yes      Grab bars in the bathroom? yes      Handrails on the stairs?  n/a      Adequate lighting?   yes  Timed Get Up and Go performed: n/a  Depression Screen PHQ 2/9 Scores 03/16/2020 01/20/2020 10/20/2019 07/19/2019  PHQ - 2 Score 0 0 0 0  PHQ- 9 Score 0 - - -     Cognitive Function     6CIT Screen 03/16/2020 03/11/2019  What Year? 0 points 0 points  What month? 0 points 0 points  What time? 0 points 0 points  Count back from 20 0 points 0 points  Months in reverse 0 points 0 points  Repeat phrase  0 points 0 points  Total Score 0 0    Immunization History  Administered Date(s) Administered  . Influenza-Unspecified 09/23/2018  . PFIZER SARS-COV-2 Vaccination 01/22/2020, 02/16/2020  . Tdap 09/27/2012    Qualifies for Shingles Vaccine? yes  Screening Tests Health Maintenance  Topic Date Due  . PNA vac Low Risk Adult (1 of 2 - PCV13) Never done  . OPHTHALMOLOGY EXAM  06/09/2020  . INFLUENZA VACCINE  07/09/2020  . FOOT EXAM  07/18/2020  . URINE MICROALBUMIN  07/18/2020  . HEMOGLOBIN A1C  07/19/2020  . MAMMOGRAM  06/24/2021  . TETANUS/TDAP  09/27/2022  . COLONOSCOPY  01/19/2029  . DEXA SCAN  Completed  . Hepatitis C Screening  Completed    Cancer Screenings: Lung: Low Dose CT Chest recommended if Age 79-80 years, 30 pack-year currently smoking OR have quit w/in 15years. Patient does not qualify. Breast:  Up to date on Mammogram? Yes   Up to date of Bone Density/Dexa? Yes Colorectal: up to date  Additional Screenings: : Hepatitis C Screening: 05/30/2014     Plan:    Patient wants to eat cleaner, continue to exercise and maintain A1C   I have personally reviewed and noted the following in the patient's chart:   . Medical and social history . Use of alcohol, tobacco or illicit drugs  . Current medications and supplements . Functional ability and status . Nutritional status . Physical activity . Advanced directives . List of other physicians . Hospitalizations, surgeries, and ER visits in previous 12 months . Vitals . Screenings to include cognitive, depression, and falls . Referrals and appointments  In addition, I have reviewed and discussed with patient certain preventive protocols, quality metrics, and best practice recommendations. A written personalized care plan for preventive services as well as general preventive health recommendations were provided to patient.     Kellie Simmering, LPN  [id number]

## 2020-03-16 NOTE — Patient Instructions (Signed)
Yvonne Barron , Thank you for taking time to come for your Medicare Wellness Visit. I appreciate your ongoing commitment to your health goals. Please review the following plan we discussed and let me know if I can assist you in the future.   Screening recommendations/referrals: Colonoscopy: 01/2019 Mammogram: 06/2019 Bone Density: 05/2018 Recommended yearly ophthalmology/optometry visit for glaucoma screening and checkup Recommended yearly dental visit for hygiene and checkup  Vaccinations: Influenza vaccine: 09/2019 Pneumococcal vaccine: believes had Tdap vaccine: 09/2012 Shingles vaccine: discussed    Advanced directives: Please bring a copy of your POA (Power of [place]) and/or Living Will to your next appointment.   Conditions/risks identified: overweight  Next appointment:    Preventive Care 3 Years and Older, Female Preventive care refers to lifestyle choices and visits with your health care provider that can promote health and wellness. What does preventive care include?  A yearly physical exam. This is also called an annual well check.  Dental exams once or twice a year.  Routine eye exams. Ask your health care provider how often you should have your eyes checked.  Personal lifestyle choices, including:  Daily care of your teeth and gums.  Regular physical activity.  Eating a healthy diet.  Avoiding tobacco and drug use.  Limiting alcohol use.  Practicing safe sex.  Taking low-dose aspirin every day.  Taking vitamin and mineral supplements as recommended by your health care provider. What happens during an annual well check? The services and screenings done by your health care provider during your annual well check will depend on your age, overall health, lifestyle risk factors, and family history of disease. Counseling  Your health care provider may ask you questions about your:  Alcohol use.  Tobacco use.  Drug use.  Emotional well-being.  Home and  relationship well-being.  Sexual activity.  Eating habits.  History of falls.  Memory and ability to understand (cognition).  Work and work Statistician.  Reproductive health. Screening  You may have the following tests or measurements:  Height, weight, and BMI.  Blood pressure.  Lipid and cholesterol levels. These may be checked every 5 years, or more frequently if you are over 47 years old.  Skin check.  Lung cancer screening. You may have this screening every year starting at age 13 if you have a 30-pack-year history of smoking and currently smoke or have quit within the past 15 years.  Fecal occult blood test (FOBT) of the stool. You may have this test every year starting at age 28.  Flexible sigmoidoscopy or colonoscopy. You may have a sigmoidoscopy every 5 years or a colonoscopy every 10 years starting at age 40.  Hepatitis C blood test.  Hepatitis B blood test.  Sexually transmitted disease (STD) testing.  Diabetes screening. This is done by checking your blood sugar (glucose) after you have not eaten for a while (fasting). You may have this done every 1-3 years.  Bone density scan. This is done to screen for osteoporosis. You may have this done starting at age 20.  Mammogram. This may be done every 1-2 years. Talk to your health care provider about how often you should have regular mammograms. Talk with your health care provider about your test results, treatment options, and if necessary, the need for more tests. Vaccines  Your health care provider may recommend certain vaccines, such as:  Influenza vaccine. This is recommended every year.  Tetanus, diphtheria, and acellular pertussis (Tdap, Td) vaccine. You may need a Td booster every 10  years.  Zoster vaccine. You may need this after age 60.  Pneumococcal 13-valent conjugate (PCV13) vaccine. One dose is recommended after age 57.  Pneumococcal polysaccharide (PPSV23) vaccine. One dose is recommended after  age 52. Talk to your health care provider about which screenings and vaccines you need and how often you need them. This information is not intended to replace advice given to you by your health care provider. Make sure you discuss any questions you have with your health care provider. Document Released: 12/22/2015 Document Revised: 08/14/2016 Document Reviewed: 09/26/2015 Elsevier Interactive Patient Education  2017 [place] Prevention in the Home Falls can cause injuries. They can happen to people of all ages. There are many things you can do to make your home safe and to help prevent falls. What can I do on the outside of my home?  Regularly fix the edges of walkways and driveways and fix any cracks.  Remove anything that might make you trip as you walk through a door, such as a raised step or threshold.  Trim any bushes or trees on the path to your home.  Use bright outdoor lighting.  Clear any walking paths of anything that might make someone trip, such as rocks or tools.  Regularly check to see if handrails are loose or broken. Make sure that both sides of any steps have handrails.  Any raised decks and porches should have guardrails on the edges.  Have any leaves, snow, or ice cleared regularly.  Use sand or salt on walking paths during winter.  Clean up any spills in your garage right away. This includes oil or grease spills. What can I do in the bathroom?  Use night lights.  Install grab bars by the toilet and in the tub and shower. Do not use towel bars as grab bars.  Use non-skid mats or decals in the tub or shower.  If you need to sit down in the shower, use a plastic, non-slip stool.  Keep the floor dry. Clean up any water that spills on the floor as soon as it happens.  Remove soap buildup in the tub or shower regularly.  Attach bath mats securely with double-sided non-slip rug tape.  Do not have throw rugs and other things on the floor that can  make you trip. What can I do in the bedroom?  Use night lights.  Make sure that you have a light by your bed that is easy to reach.  Do not use any sheets or blankets that are too big for your bed. They should not hang down onto the floor.  Have a firm chair that has side arms. You can use this for support while you get dressed.  Do not have throw rugs and other things on the floor that can make you trip. What can I do in the kitchen?  Clean up any spills right away.  Avoid walking on wet floors.  Keep items that you use a lot in easy-to-reach places.  If you need to reach something above you, use a strong step stool that has a grab bar.  Keep electrical cords out of the way.  Do not use floor polish or wax that makes floors slippery. If you must use wax, use non-skid floor wax.  Do not have throw rugs and other things on the floor that can make you trip. What can I do with my stairs?  Do not leave any items on the stairs.  Make sure that  there are handrails on both sides of the stairs and use them. Fix handrails that are broken or loose. Make sure that handrails are as long as the stairways.  Check any carpeting to make sure that it is firmly attached to the stairs. Fix any carpet that is loose or worn.  Avoid having throw rugs at the top or bottom of the stairs. If you do have throw rugs, attach them to the floor with carpet tape.  Make sure that you have a light switch at the top of the stairs and the bottom of the stairs. If you do not have them, ask someone to add them for you. What else can I do to help prevent falls?  Wear shoes that:  Do not have high heels.  Have rubber bottoms.  Are comfortable and fit you well.  Are closed at the toe. Do not wear sandals.  If you use a stepladder:  Make sure that it is fully opened. Do not climb a closed stepladder.  Make sure that both sides of the stepladder are locked into place.  Ask someone to hold it for you,  if possible.  Clearly mark and make sure that you can see:  Any grab bars or handrails.  First and last steps.  Where the edge of each step is.  Use tools that help you move around (mobility aids) if they are needed. These include:  Canes.  Walkers.  Scooters.  Crutches.  Turn on the lights when you go into a dark area. Replace any light bulbs as soon as they burn out.  Set up your furniture so you have a clear path. Avoid moving your furniture around.  If any of your floors are uneven, fix them.  If there are any pets around you, be aware of where they are.  Review your medicines with your doctor. Some medicines can make you feel dizzy. This can increase your chance of falling. Ask your doctor what other things that you can do to help prevent falls. This information is not intended to replace advice given to you by your health care provider. Make sure you discuss any questions you have with your health care provider. Document Released: 09/21/2009 Document Revised: 05/02/2016 Document Reviewed: 12/30/2014 Elsevier Interactive Patient Education  2017 [employer].

## 2020-03-20 ENCOUNTER — Encounter: Payer: Self-pay | Admitting: Nurse Practitioner

## 2020-03-20 DIAGNOSIS — I10 Essential (primary) hypertension: Secondary | ICD-10-CM | POA: Diagnosis not present

## 2020-03-20 DIAGNOSIS — Z0000 Encounter for general adult medical examination without abnormal findings: Secondary | ICD-10-CM | POA: Diagnosis not present

## 2020-03-20 DIAGNOSIS — R7309 Other abnormal glucose: Secondary | ICD-10-CM | POA: Diagnosis not present

## 2020-03-21 LAB — CMP14+EGFR
ALT: 7 IU/L (ref 0–32)
AST: 13 IU/L (ref 0–40)
Albumin/Globulin Ratio: 2 (ref 1.2–2.2)
Albumin: 4.5 g/dL (ref 3.7–4.7)
Alkaline Phosphatase: 89 IU/L (ref 39–117)
BUN/Creatinine Ratio: 21 (ref 12–28)
BUN: 15 mg/dL (ref 8–27)
Bilirubin Total: 0.3 mg/dL (ref 0.0–1.2)
CO2: 22 mmol/L (ref 20–29)
Calcium: 9.5 mg/dL (ref 8.7–10.3)
Chloride: 102 mmol/L (ref 96–106)
Creatinine, Ser: 0.7 mg/dL (ref 0.57–1.00)
GFR calc Af Amer: 101 mL/min/{1.73_m2} (ref >59)
GFR calc non Af Amer: 87 mL/min/{1.73_m2} (ref >59)
Globulin, Total: 2.2 g/dL (ref 1.5–4.5)
Glucose: 106 mg/dL — ABNORMAL HIGH (ref 65–99)
Potassium: 4.1 mmol/L (ref 3.5–5.2)
Sodium: 139 mmol/L (ref 134–144)
Total Protein: 6.7 g/dL (ref 6.0–8.5)

## 2020-03-21 LAB — CBC
Hematocrit: 37.8 % (ref 34.0–46.6)
Hemoglobin: 12.1 g/dL (ref 11.1–15.9)
MCH: 27.4 pg (ref 26.6–33.0)
MCHC: 32 g/dL (ref 31.5–35.7)
MCV: 86 fL (ref 79–97)
Platelets: 335 10*3/uL (ref 150–450)
RBC: 4.42 x10E6/uL (ref 3.77–5.28)
RDW: 13.5 % (ref 11.7–15.4)
WBC: 6 10*3/uL (ref 3.4–10.8)

## 2020-03-21 LAB — LIPID PANEL
Chol/HDL Ratio: 2.5 ratio (ref 0.0–4.4)
Cholesterol, Total: 180 mg/dL (ref 100–199)
HDL: 73 mg/dL (ref >39)
LDL Chol Calc (NIH): 97 mg/dL (ref 0–99)
Triglycerides: 48 mg/dL (ref 0–149)
VLDL Cholesterol Cal: 10 mg/dL (ref 5–40)

## 2020-03-21 LAB — HEMOGLOBIN A1C
Est. average glucose Bld gHb Est-mCnc: 128 mg/dL
Hgb A1c MFr Bld: 6.1 % — ABNORMAL HIGH (ref 4.8–5.6)

## 2020-03-28 DIAGNOSIS — M25562 Pain in left knee: Secondary | ICD-10-CM | POA: Diagnosis not present

## 2020-04-04 DIAGNOSIS — M25562 Pain in left knee: Secondary | ICD-10-CM | POA: Diagnosis not present

## 2020-04-04 DIAGNOSIS — M1712 Unilateral primary osteoarthritis, left knee: Secondary | ICD-10-CM | POA: Diagnosis not present

## 2020-05-05 ENCOUNTER — Other Ambulatory Visit: Payer: Self-pay | Admitting: Nurse Practitioner

## 2020-05-05 DIAGNOSIS — R7309 Other abnormal glucose: Secondary | ICD-10-CM

## 2020-05-05 DIAGNOSIS — R69 Illness, unspecified: Secondary | ICD-10-CM | POA: Diagnosis not present

## 2020-05-31 ENCOUNTER — Telehealth: Payer: Self-pay

## 2020-05-31 ENCOUNTER — Encounter: Payer: Self-pay | Admitting: Nurse Practitioner

## 2020-05-31 NOTE — Telephone Encounter (Signed)
vm for pt to return call to schedule appt

## 2020-06-01 ENCOUNTER — Other Ambulatory Visit: Payer: Self-pay | Admitting: Obstetrics and Gynecology

## 2020-06-01 DIAGNOSIS — Z1231 Encounter for screening mammogram for malignant neoplasm of breast: Secondary | ICD-10-CM

## 2020-06-15 ENCOUNTER — Ambulatory Visit (INDEPENDENT_AMBULATORY_CARE_PROVIDER_SITE_OTHER): Payer: Medicare HMO | Admitting: Nurse Practitioner

## 2020-06-15 ENCOUNTER — Other Ambulatory Visit: Payer: Self-pay

## 2020-06-15 ENCOUNTER — Encounter: Payer: Self-pay | Admitting: Nurse Practitioner

## 2020-06-15 VITALS — BP 130/78 | HR 84 | Temp 98.1°F | Ht 64.0 in | Wt 143.0 lb

## 2020-06-15 DIAGNOSIS — E2839 Other primary ovarian failure: Secondary | ICD-10-CM

## 2020-06-15 DIAGNOSIS — R208 Other disturbances of skin sensation: Secondary | ICD-10-CM

## 2020-06-15 DIAGNOSIS — I10 Essential (primary) hypertension: Secondary | ICD-10-CM | POA: Diagnosis not present

## 2020-06-15 DIAGNOSIS — M79672 Pain in left foot: Secondary | ICD-10-CM

## 2020-06-15 DIAGNOSIS — M79671 Pain in right foot: Secondary | ICD-10-CM

## 2020-06-15 DIAGNOSIS — E119 Type 2 diabetes mellitus without complications: Secondary | ICD-10-CM

## 2020-06-15 NOTE — Progress Notes (Signed)
This visit occurred during the SARS-CoV-2 public health emergency.  Safety protocols were in place, including screening questions prior to the visit, additional usage of staff PPE, and extensive cleaning of exam room while observing appropriate contact time as indicated for disinfecting solutions.  Subjective:     Patient ID: Yvonne Barron , female    DOB: 10-29-1946 , 74 y.o.   MRN: [id number]   Chief Complaint  Patient presents with  . Diabetes  . Hypertension    HPI  She is concerned about having pain to bilateral feet, reports her toes feel "funny".  She would like to see a podiatrist to evaluate further.  She has taken vitamin B but was not effective.    She is scheduled for her mammogram at the end of this month  Diabetes She presents for her follow-up diabetic visit. Diabetes type: prediabetes. There are no hypoglycemic associated symptoms. There are no diabetic associated symptoms. There are no hypoglycemic complications. There are no diabetic complications. Current diabetic treatment includes oral agent (monotherapy). She is compliant with treatment all of the time. Her weight is stable. She is following a generally healthy diet. When asked about meal planning, she reported none. She has not had a previous visit with a dietitian. Exercise: two days a week - 45 minutes a day. There is no change in her home blood glucose trend. (Blood sugars have been averaging 118-120.  ) An ACE inhibitor/angiotensin II receptor blocker is being taken. She does not see a podiatrist.Eye exam is not current (Appt on July 19th).     Past Medical History:  Diagnosis Date  . History of breast cancer 2009--  INVASIVE DUCTAL IN SITU--  S/P MASTECTOMY   NO RECURRENCE  . Hypertension   . Urethral caruncle      Family History  Problem Relation Age of Onset  . Diabetes Mother   . Diabetes Father   . Kidney disease Father   . Diabetes Brother      Current Outpatient Medications:  .   calcium carbonate (OS-CAL - DOSED IN MG OF ELEMENTAL CALCIUM) 1250 (500 Ca) MG tablet, Take 1 tablet by mouth., Disp: , Rfl:  .  cholecalciferol (VITAMIN D) 1000 units tablet, Take 1,000 Units by mouth 2 (two) times daily., Disp: , Rfl:  .  hydrochlorothiazide (HYDRODIURIL) 12.5 MG tablet, Take 1 tablet (12.5 mg total) by mouth daily., Disp: 90 tablet, Rfl: 1 .  ONETOUCH VERIO test strip, USE 1 STRIP TO CHECK GLUCOSE ONCE DAILY, Disp: 100 each, Rfl: 0 .  valACYclovir (VALTREX) 1000 MG tablet, Take 1,000 mg by mouth as needed., Disp: , Rfl:  .  vitamin B-12 (CYANOCOBALAMIN) 500 MCG tablet, Take 500 mcg by mouth daily., Disp: , Rfl:  .  vitamin C (ASCORBIC ACID) 500 MG tablet, Take 500 mg by mouth daily., Disp: , Rfl:    No Known Allergies   Review of Systems   Today's Vitals   06/15/20 1106  BP: 130/78  Pulse: 84  Temp: 98.1 F (36.7 C)  TempSrc: Oral  Weight: 143 lb (64.9 kg)  Height: 5' 4"  (1.626 m)  PainSc: 0-No pain   Body mass index is 24.55 kg/m.   Objective:  Physical Exam Constitutional:      General: She is not in acute distress.    Appearance: Normal appearance. She is well-developed. She is obese.  Cardiovascular:     Rate and Rhythm: Normal rate and regular rhythm.     Pulses: Normal pulses.  Heart sounds: Normal heart sounds. No murmur heard.   Pulmonary:     Effort: Pulmonary effort is normal.     Breath sounds: Normal breath sounds.  Chest:     Chest wall: No tenderness.  Musculoskeletal:        General: Normal range of motion.  Skin:    General: Skin is warm and dry.     Capillary Refill: Capillary refill takes less than 2 seconds.  Neurological:     General: No focal deficit present.     Mental Status: She is alert and oriented to person, place, and time.  Psychiatric:        Mood and Affect: Mood normal.        Behavior: Behavior normal.        Thought Content: Thought content normal.        Judgment: Judgment normal.         Assessment  And Plan:     1. Essential hypertension  Chronic, blood pressure is well controlled - BMP8+eGFR  2. Type 2 diabetes mellitus without complication, without long-term current use of insulin (HCC)  Chronic, stable  Continue with healthy diet and exercise  Diabetic foot exam done with decreased sensation bilateral feet  She has already been treated by the orthopedic - Hemoglobin A1c - Ambulatory referral to Podiatry - BMP8+eGFR  3. Bilateral foot pain  She is having pain to both feet more regularly and having issues with her balance  Will refer to Podiatry for further evaluation - Ambulatory referral to Podiatry - Vitamin B12 - TSH  4. Decreased sensation of foot  Bilateral feet with decreased senstation  Will check for metabolic causes - Vitamin B12 - TSH  5. Decreased estrogen level  - DG Bone Density; Future   Minette Brine, FNP    THE PATIENT IS ENCOURAGED TO PRACTICE SOCIAL DISTANCING DUE TO THE COVID-19 PANDEMIC.

## 2020-06-16 LAB — BMP8+EGFR
BUN/Creatinine Ratio: 21 (ref 12–28)
BUN: 16 mg/dL (ref 8–27)
CO2: 25 mmol/L (ref 20–29)
Calcium: 9.8 mg/dL (ref 8.7–10.3)
Chloride: 102 mmol/L (ref 96–106)
Creatinine, Ser: 0.77 mg/dL (ref 0.57–1.00)
GFR calc Af Amer: 89 mL/min/{1.73_m2} (ref >59)
GFR calc non Af Amer: 77 mL/min/{1.73_m2} (ref >59)
Glucose: 97 mg/dL (ref 65–99)
Potassium: 4 mmol/L (ref 3.5–5.2)
Sodium: 142 mmol/L (ref 134–144)

## 2020-06-16 LAB — VITAMIN B12: Vitamin B-12: 1370 pg/mL — ABNORMAL HIGH (ref 232–1245)

## 2020-06-16 LAB — HEMOGLOBIN A1C
Est. average glucose Bld gHb Est-mCnc: 126 mg/dL
Hgb A1c MFr Bld: 6 % — ABNORMAL HIGH (ref 4.8–5.6)

## 2020-06-16 LAB — TSH: TSH: 1.64 u[IU]/mL (ref 0.450–4.500)

## 2020-06-26 ENCOUNTER — Ambulatory Visit
Admission: RE | Admit: 2020-06-26 | Discharge: 2020-06-26 | Disposition: A | Discharge status: Home / Self Care | Payer: Medicare HMO | Source: Ambulatory Visit | Attending: Obstetrics and Gynecology | Admitting: Obstetrics and Gynecology

## 2020-06-26 ENCOUNTER — Other Ambulatory Visit: Payer: Self-pay

## 2020-06-26 DIAGNOSIS — H5213 Myopia, bilateral: Secondary | ICD-10-CM | POA: Diagnosis not present

## 2020-06-26 DIAGNOSIS — Z1231 Encounter for screening mammogram for malignant neoplasm of breast: Secondary | ICD-10-CM | POA: Diagnosis not present

## 2020-06-26 DIAGNOSIS — H16223 Keratoconjunctivitis sicca, not specified as Sjogren's, bilateral: Secondary | ICD-10-CM | POA: Diagnosis not present

## 2020-06-26 DIAGNOSIS — H524 Presbyopia: Secondary | ICD-10-CM | POA: Diagnosis not present

## 2020-06-26 DIAGNOSIS — H35033 Hypertensive retinopathy, bilateral: Secondary | ICD-10-CM | POA: Diagnosis not present

## 2020-06-26 DIAGNOSIS — H353132 Nonexudative age-related macular degeneration, bilateral, intermediate dry stage: Secondary | ICD-10-CM | POA: Diagnosis not present

## 2020-06-26 DIAGNOSIS — H52223 Regular astigmatism, bilateral: Secondary | ICD-10-CM | POA: Diagnosis not present

## 2020-06-26 DIAGNOSIS — H25813 Combined forms of age-related cataract, bilateral: Secondary | ICD-10-CM | POA: Diagnosis not present

## 2020-06-26 LAB — HM DIABETES EYE EXAM

## 2020-07-04 ENCOUNTER — Encounter: Payer: Self-pay | Admitting: Sports Medicine

## 2020-07-04 ENCOUNTER — Ambulatory Visit: Payer: Medicare HMO | Admitting: Sports Medicine

## 2020-07-04 ENCOUNTER — Other Ambulatory Visit: Payer: Self-pay

## 2020-07-04 DIAGNOSIS — E119 Type 2 diabetes mellitus without complications: Secondary | ICD-10-CM | POA: Diagnosis not present

## 2020-07-04 DIAGNOSIS — C801 Malignant (primary) neoplasm, unspecified: Secondary | ICD-10-CM

## 2020-07-04 DIAGNOSIS — G629 Polyneuropathy, unspecified: Secondary | ICD-10-CM | POA: Diagnosis not present

## 2020-07-04 NOTE — Progress Notes (Signed)
Subjective: Yvonne Barron is a 74 y.o. female patient who presents to office for evaluation of numbness and tingling to all toes bilateral reports that has been going on for the last 2 to 3 years also states that she does not have full range of motion in the big toe joints admits to a previous history of Cartevia implant at her big toe joint on the right.  Patient reports that she is wondering with this nerve pain is coming from has tried some padding but it has not helped with the pain and reports that sometimes she has issues with her balance and states that the pain is worse at nighttime.  Patient denies any changes with medical history and reports that she is still considered borderline diabetic and reports a history of previous cancer and was on cancer medication by mouth.  Patient denies any other pedal complaints except having issues with her left knee and will be going for knee surgery soon.  No other issues noted.  Patient Active Problem List   Diagnosis Date Noted  . Type 2 diabetes mellitus without complication, without long-term current use of insulin ([place]) 04/15/2019  . Abnormal glucose 09/14/2018  . Vitamin D deficiency 08/29/2018  . Cervicalgia 08/29/2018  . Decreased estrogen level 08/29/2018  . Urinary frequency 08/29/2018  . Cancer ([place]) 05/18/2018  . Urethral caruncle 07/10/2012  . Breast cancer, left breast ([place]) 08/06/2011  . PARESTHESIA 07/01/2008  . Breast cancer in situ 12/10/2007  . Essential hypertension 06/17/2007    Current Outpatient Medications on File Prior to Visit  Medication Sig Dispense Refill  . calcium carbonate (OS-CAL - DOSED IN MG OF ELEMENTAL CALCIUM) 1250 (500 Ca) MG tablet Take 1 tablet by mouth.    . cholecalciferol (VITAMIN D) 1000 units tablet Take 1,000 Units by mouth 2 (two) times daily.    . hydrochlorothiazide (HYDRODIURIL) 12.5 MG tablet Take 1 tablet (12.5 mg total) by mouth daily. 90 tablet 1  . ONETOUCH VERIO test strip USE 1  STRIP TO CHECK GLUCOSE ONCE DAILY 100 each 0  . valACYclovir (VALTREX) 1000 MG tablet Take 1,000 mg by mouth as needed.    . vitamin B-12 (CYANOCOBALAMIN) 500 MCG tablet Take 500 mcg by mouth daily.    . vitamin C (ASCORBIC ACID) 500 MG tablet Take 500 mg by mouth daily.     No current facility-administered medications on file prior to visit.    No Known Allergies  Objective:  General: Alert and oriented x3 in no acute distress  Dermatology: Old scar well-healed at first metatarsophalangeal joint on right.  No open lesions bilateral lower extremities, no webspace macerations, no ecchymosis bilateral, all nails x 10 are well manicured.  Vascular: Dorsalis Pedis and Posterior Tibial pedal pulses palpable.  Neurology: Gross sensation intact via light touch bilateral, vibratory sensation severely diminished bilateral likely consistent with small fiber neuropathy since patient does not have any history of any large fiber involvement or radiculopathy.  Subjective numbness to all toes bilateral.  Musculoskeletal: No tenderness with palpation bilateral.  There is severely limited first metatarsophalangeal joint range of motion right greater than left history of previous Cartevia the procedure  Assessment and Plan: Problem List Items Addressed This Visit      Endocrine   Type 2 diabetes mellitus without complication, without long-term current use of insulin ([place])     Other   Cancer ([place])    Other Visit Diagnoses    Neuropathy    -  Primary       -  Complete examination performed -Discussed treatment options for her neuropathy -Offered patient gabapentin 300 mg at bedtime however at this time she declines and wants to continue with monitoring her symptoms and wants to first have knee surgery and reports that if after her knee surgery her symptoms still persist may consider calling or coming in for reevaluation for gabapentin -Offered physical therapy for neurogenic cyst will help with  patient wants to hold off at this time until after her knee surgery -Patient to return to office as needed or sooner if condition worsens.  Landis Martins, DPM

## 2020-07-17 NOTE — Patient Instructions (Addendum)
DUE TO COVID-19 ONLY ONE VISITOR IS ALLOWED TO COME WITH YOU AND STAY IN THE WAITING ROOM ONLY DURING PRE OP AND PROCEDURE DAY OF SURGERY. THE 1 VISITOR  MAY VISIT WITH YOU AFTER SURGERY IN YOUR PRIVATE ROOM DURING VISITING HOURS ONLY!  YOU NEED TO HAVE A COVID 19 TEST ON___8/17____ @_______ , THIS TEST MUST BE DONE BEFORE SURGERY,  COVID TESTING SITE [address] [place] [address], IT IS ON THE RIGHT GOING OUT [address] APPROXIMATELY  2 MINUTES PAST [employer] ON THE RIGHT. ONCE YOUR COVID TEST IS COMPLETED,  PLEASE BEGIN THE QUARANTINE INSTRUCTIONS AS OUTLINED IN YOUR HANDOUT.                Lucious Groves    Your procedure is scheduled on: 07/28/20   Report to [facility] Main  Entrance   Report to admitting at   10:35 AM     Call this number if you have problems the morning of surgery [place], NO CHEWING GUM [place].  No food after midnight.    You may have clear liquid until 10:00 AM.    At 10:00 AM drink pre surgery drink.   Nothing by mouth after 10:00 AM.    Take these medicines the morning of surgery with A SIP OF WATER:  Use your eye drops                                 You may not have any metal on your body including hair pins and              piercings  Do not wear jewelry, make-up, lotions, powders or perfumes, deodorant             Do not wear nail polish on your fingernails.  Do not shave  48 hours prior to surgery.                Do not bring valuables to the hospital. [place].  Contacts, dentures or bridgework may not be worn into surgery.   [facility] - Preparing for Surgery Before surgery, you can play an important role.   Because skin is not sterile, your skin needs to be as free of germs as possible.   You can reduce the number of germs on your skin by washing with CHG  (chlorahexidine gluconate) soap before surgery.   CHG is an antiseptic cleaner which kills germs and bonds with the skin to continue killing germs even after washing. Please DO NOT use if you have an allergy to CHG or antibacterial soaps.   If your skin becomes reddened/irritated stop using the CHG and inform your nurse when you arrive at Short Stay. Do not shave (including legs and underarms) for at least 48 hours prior to the first CHG shower.    Please follow these instructions carefully:  1.  Shower with CHG Soap the night before surgery and the  morning of Surgery.  2.  If you choose to wash your hair, wash your hair first as usual with your  normal  shampoo.  3.  After you shampoo, rinse your hair and body thoroughly to remove the  shampoo.  4.  Use CHG as you would any other liquid soap.  You can apply chg directly  to the skin and wash                       Gently with a scrungie or clean washcloth.  5.  Apply the CHG Soap to your body ONLY FROM THE NECK DOWN.   Do not use on face/ open                           Wound or open sores. Avoid contact with eyes, ears mouth and genitals (private parts).                       Wash face,  Genitals (private parts) with your normal soap.             6.  Wash thoroughly, paying special attention to the area where your surgery  will be performed.  7.  Thoroughly rinse your body with warm water from the neck down.  8.  DO NOT shower/wash with your normal soap after using and rinsing off  the CHG Soap.             9.  Pat yourself dry with a clean towel.            10.  Wear clean pajamas.            11.  Place clean sheets on your bed the night of your first shower and do not  sleep with pets. Day of Surgery : Do not apply any lotions/deodorants the morning of surgery.  Please wear clean clothes to the hospital/surgery center.  FAILURE TO FOLLOW THESE INSTRUCTIONS MAY RESULT IN THE CANCELLATION OF YOUR  SURGERY PATIENT SIGNATURE_________________________________  NURSE SIGNATURE__________________________________  ________________________________________________________________________     Adam Phenix  An incentive spirometer is a tool that can help keep your lungs clear and active. This tool measures how well you are filling your lungs with each breath. Taking long deep breaths may help reverse or decrease the chance of developing breathing (pulmonary) problems (especially infection) following:  A long period of time when you are unable to move or be active. BEFORE THE PROCEDURE   If the spirometer includes an indicator to show your best effort, your nurse or respiratory therapist will set it to a desired goal.  If possible, sit up straight or lean slightly forward. Try not to slouch.  Hold the incentive spirometer in an upright position. INSTRUCTIONS FOR USE  1. Sit on the edge of your bed if possible, or sit up as far as you can in bed or on a chair. 2. Hold the incentive spirometer in an upright position. 3. Breathe out normally. 4. Place the mouthpiece in your mouth and seal your lips tightly around it. 5. Breathe in slowly and as deeply as possible, raising the piston or the ball toward the top of the column. 6. Hold your breath for 3-5 seconds or for as long as possible. Allow the piston or ball to fall to the bottom of the column. 7. Remove the mouthpiece from your mouth and breathe out normally. 8. Rest for a few seconds and repeat Steps 1 through 7 at least 10 times every 1-2 hours when you are awake. Take your time and take a few normal breaths between deep breaths. 9. The spirometer may include an indicator to show your  best effort. Use the indicator as a goal to work toward during each repetition. 10. After each set of 10 deep breaths, practice coughing to be sure your lungs are clear. If you have an incision (the cut made at the time of surgery), support your  incision when coughing by placing a pillow or rolled up towels firmly against it. Once you are able to get out of bed, walk around indoors and cough well. You may stop using the incentive spirometer when instructed by your caregiver.  RISKS AND COMPLICATIONS  Take your time so you do not get dizzy or light-headed.  If you are in pain, you may need to take or ask for pain medication before doing incentive spirometry. It is harder to take a deep breath if you are having pain. AFTER USE  Rest and breathe slowly and easily.  It can be helpful to keep track of a log of your progress. Your caregiver can provide you with a simple table to help with this. If you are using the spirometer at home, follow these instructions: [place] IF:   You are having difficultly using the spirometer.  You have trouble using the spirometer as often as instructed.  Your pain medication is not giving enough relief while using the spirometer.  You develop fever of 100.5 F (38.1 C) or higher. SEEK IMMEDIATE MEDICAL CARE IF:   You cough up bloody sputum that had not been present before.  You develop fever of 102 F (38.9 C) or greater.  You develop worsening pain at or near the incision site. MAKE SURE YOU:   Understand these instructions.  Will watch your condition.  Will get help right away if you are not doing well or get worse. Document Released: 04/07/2007 Document Revised: 02/17/2012 Document Reviewed: 06/08/2007 [facility] Patient Information 2014 [place], [place].   ________________________________________________________________________

## 2020-07-18 ENCOUNTER — Other Ambulatory Visit: Payer: Self-pay

## 2020-07-18 ENCOUNTER — Encounter (HOSPITAL_COMMUNITY): Payer: Self-pay

## 2020-07-18 ENCOUNTER — Encounter (HOSPITAL_COMMUNITY)
Admission: RE | Admit: 2020-07-18 | Discharge: 2020-07-18 | Disposition: A | Discharge status: Home / Self Care | Payer: Medicare HMO | Source: Ambulatory Visit | Attending: Orthopedic Surgery | Admitting: Orthopedic Surgery

## 2020-07-18 DIAGNOSIS — Z01812 Encounter for preprocedural laboratory examination: Secondary | ICD-10-CM | POA: Insufficient documentation

## 2020-07-18 HISTORY — DX: Unspecified osteoarthritis, unspecified site: M19.90

## 2020-07-18 HISTORY — DX: Prediabetes: R73.03

## 2020-07-18 LAB — CBC
HCT: 38.9 % (ref 36.0–46.0)
Hemoglobin: 11.9 g/dL — ABNORMAL LOW (ref 12.0–15.0)
MCH: 26.9 pg (ref 26.0–34.0)
MCHC: 30.6 g/dL (ref 30.0–36.0)
MCV: 88 fL (ref 80.0–100.0)
Platelets: 304 K/uL (ref 150–400)
RBC: 4.42 MIL/uL (ref 3.87–5.11)
RDW: 14.4 % (ref 11.5–15.5)
WBC: 6.8 K/uL (ref 4.0–10.5)
nRBC: 0 % (ref 0.0–0.2)

## 2020-07-18 LAB — BASIC METABOLIC PANEL
Anion gap: 8 (ref 5–15)
BUN: 15 mg/dL (ref 8–23)
CO2: 29 mmol/L (ref 22–32)
Calcium: 9.2 mg/dL (ref 8.9–10.3)
Chloride: 105 mmol/L (ref 98–111)
Creatinine, Ser: 0.77 mg/dL (ref 0.44–1.00)
GFR calc Af Amer: >60 mL/min (ref >60)
GFR calc non Af Amer: >60 mL/min (ref >60)
Glucose, Bld: 91 mg/dL (ref 70–99)
Potassium: 4.3 mmol/L (ref 3.5–5.1)
Sodium: 142 mmol/L (ref 135–145)

## 2020-07-18 LAB — SURGICAL PCR SCREEN
MRSA, PCR: NEGATIVE
Staphylococcus aureus: NEGATIVE

## 2020-07-18 NOTE — Progress Notes (Signed)
COVID Vaccine Completed:yes Date COVID Vaccine completed:02/16/20 COVID vaccine manufacturer: [employer]      PCP - Dr. Othelia Pulling Cardiologist - no  Chest x-ray - no EKG - 03/16/20 Stress Test - no ECHO - no Cardiac Cath - no  Sleep Study - no CPAP -   Fasting Blood Sugar - NA Checks Blood Sugar _____ times a day  Blood Thinner Instructions:NA Aspirin Instructions: Last Dose:  Anesthesia review:   Patient denies shortness of breath, fever, cough and chest pain at PAT appointment yes   Patient verbalized understanding of instructions that were given to them at the PAT appointment. Patient was also instructed that they will need to review over the PAT instructions again at home before surgery. Yes Pt works out regularly and is in good shape. She has no SOB doing housework, climbing stairs or with ADLs

## 2020-07-19 NOTE — H&P (Signed)
Yvonne Barron is an 74 y.o. female.   Chief Complaint: Left knee pain HPI: 74 yo female who presents with a history of chronic left knee pain due to end stage OA.  The patient has failed and extended period of conservative treatment for knee OA including therapy, injections, rest, NSAIDs, modification of activity and presents now for total knee replacement to reduce pain and restore function.  Bone on Bone XRAY changes are seen on standing XRAYs.   Past Medical History:  Diagnosis Date  . Arthritis    knee Lt  . History of breast cancer 2009--  INVASIVE DUCTAL IN SITU--  S/P MASTECTOMY   NO RECURRENCE  . Hypertension   . Pre-diabetes   . Urethral caruncle     Past Surgical History:  Procedure Laterality Date  . CYSTOSCOPY WITH URETHRAL CARUNCLE  07/10/2012   Procedure: CYSTOSCOPY WITH URETHRAL CARUNCLE;  Surgeon: Claybon Jabs, MD;  Location: [facility];  Service: Urology;  Laterality: N/A;    EXCISION OF CARUNCLE   . LEFT TOTAL MASTECTOMY/ LEFT AXILLARY SENTINEL LYMPH NODE BX  03-01-2008   INVASIVE  DUCTAL CARCINOMA IN SITU  . LEFT WRIST SURG  2008   REPAIR NERVE  . MASTECTOMY Left 2009  . TOE SURGERY Right 2017   Great toe  . VAGINAL HYSTERECTOMY  1998   W/ BILATERAL SALPINGOOPHECTOMY    Family History  Problem Relation Age of Onset  . Diabetes Mother   . Diabetes Father   . Kidney disease Father   . Diabetes Brother    Social History:  reports that she quit smoking about 33 years ago. She has a 40.00 pack-year smoking history. She has never used smokeless tobacco. She reports that she does not drink alcohol and does not use drugs.  Allergies: No Known Allergies  No medications prior to admission.    Results for orders placed or performed during the hospital encounter of 07/18/20 (from the past 48 hour(s))  Basic metabolic panel per protocol     Status: None   Collection Time: 07/18/20  1:47 PM  Result Value Ref Range   Sodium 142 135 - 145  mmol/L   Potassium 4.3 3.5 - 5.1 mmol/L   Chloride 105 98 - 111 mmol/L   CO2 29 22 - 32 mmol/L   Glucose, Bld 91 70 - 99 mg/dL    Comment: Glucose reference range applies only to samples taken after fasting for at least 8 hours.   BUN 15 8 - 23 mg/dL   Creatinine, Ser 0.77 0.44 - 1.00 mg/dL   Calcium 9.2 8.9 - 10.3 mg/dL   GFR calc non Af Amer >60 >60 mL/min   GFR calc Af Amer >60 >60 mL/min   Anion gap 8 5 - 15    Comment: Performed at [facility], [place] [address] [place], [place] [address]  CBC per protocol     Status: Abnormal   Collection Time: 07/18/20  1:47 PM  Result Value Ref Range   WBC 6.8 4.0 - 10.5 K/uL   RBC 4.42 3.87 - 5.11 MIL/uL   Hemoglobin 11.9 (L) 12.0 - 15.0 g/dL   HCT 38.9 36 - 46 %   MCV 88.0 80.0 - 100.0 fL   MCH 26.9 26.0 - 34.0 pg   MCHC 30.6 30.0 - 36.0 g/dL   RDW 14.4 11.5 - 15.5 %   Platelets 304 150 - 400 K/uL   nRBC 0.0 0.0 - 0.2 %  Comment: Performed at [facility], [place] [address] [place], [place] [address]  Surgical pcr screen     Status: None   Collection Time: 07/18/20  1:47 PM   Specimen: Nasal Mucosa; Nasal Swab  Result Value Ref Range   MRSA, PCR NEGATIVE NEGATIVE   Staphylococcus aureus NEGATIVE NEGATIVE    Comment: (NOTE) The Xpert SA Assay (FDA approved for NASAL specimens in patients 54 years of age and older), is one component of a comprehensive surveillance program. It is not intended to diagnose infection nor to guide or monitor treatment. Performed at [facility], [place] [address], [place], [place] [address]    No results found.  Review of Systems  There were no vitals taken for this visit. Physical Exam  Awake and Alert in mild distress due to chronic knee pain. C spine with pain free AROM. Heart regular. Chest normal excursion. Bilateral UEs with pain AROM Right knee with pain free AROM and neutral knee alignment. Left knee with moderate swelling and tenderness  over the medial and lateral joint line of the knee. Crepitance and pain with AROM. Full extension with pain and normal patellar tracking. Flexion 110 degrees with pain. No popliteal masses. No calf swelling or tenderness and Neg Homans Distally NVI    Assessment/Plan End Stage OA left knee. Failed conservative management.  Plan left TKR. Post op DVT prophylaxis and periop antibiotics  [name], MD 07/19/2020, 7:10 PM

## 2020-07-25 ENCOUNTER — Other Ambulatory Visit: Payer: Self-pay | Admitting: Nurse Practitioner

## 2020-07-25 ENCOUNTER — Inpatient Hospital Stay (HOSPITAL_COMMUNITY): Admission: RE | Admit: 2020-07-25 | Payer: Medicare HMO | Source: Ambulatory Visit

## 2020-07-25 DIAGNOSIS — I10 Essential (primary) hypertension: Secondary | ICD-10-CM

## 2020-07-26 ENCOUNTER — Other Ambulatory Visit (HOSPITAL_COMMUNITY)
Admission: RE | Admit: 2020-07-26 | Discharge: 2020-07-26 | Disposition: A | Discharge status: Home / Self Care | Payer: Medicare HMO | Source: Ambulatory Visit | Attending: Orthopedic Surgery | Admitting: Orthopedic Surgery

## 2020-07-26 DIAGNOSIS — Z20822 Contact with and (suspected) exposure to covid-19: Secondary | ICD-10-CM | POA: Insufficient documentation

## 2020-07-26 DIAGNOSIS — Z01812 Encounter for preprocedural laboratory examination: Secondary | ICD-10-CM | POA: Insufficient documentation

## 2020-07-26 LAB — SARS CORONAVIRUS 2 (TAT 6-24 HRS): SARS Coronavirus 2: NEGATIVE

## 2020-07-27 NOTE — Anesthesia Preprocedure Evaluation (Addendum)
Anesthesia Evaluation  Patient identified by MRN, date of birth, ID band Patient awake    Reviewed: Allergy & Precautions, NPO status , Patient's Chart, lab work & pertinent test results  Airway Mallampati: I  TM Distance: >3 FB Neck ROM: Full    Dental  (+) Partial Lower, Partial Upper, Dental Advisory Given   Pulmonary former smoker,  Quit smoking 1988, 40 pack year history  No inhalers   Pulmonary exam normal breath sounds clear to auscultation       Cardiovascular hypertension, Pt. on medications Normal cardiovascular exam Rhythm:Regular Rate:Normal     Neuro/Psych negative neurological ROS  negative psych ROS   GI/Hepatic negative GI ROS, Neg liver ROS,   Endo/Other  diabetes (pre-diabetic), Well ControlledLast a1c 6.0  Renal/GU negative Renal ROS  negative genitourinary   Musculoskeletal  (+) Arthritis , Osteoarthritis,  L knee OA   Abdominal   Peds  Hematology negative hematology ROS (+) hct 38.9, plt 304   Anesthesia Other Findings Hx L breast ca w/ LNs  Reproductive/Obstetrics negative OB ROS                            Anesthesia Physical Anesthesia Plan  ASA: II  Anesthesia Plan: Spinal, Regional and MAC   Post-op Pain Management:  Regional for Post-op pain   Induction:   PONV Risk Score and Plan: 2 and Propofol infusion and TIVA  Airway Management Planned: Natural Airway and Nasal Cannula  Additional Equipment: None  Intra-op Plan:   Post-operative Plan:   Informed Consent: I have reviewed the patients History and Physical, chart, labs and discussed the procedure including the risks, benefits and alternatives for the proposed anesthesia with the patient or authorized representative who has indicated his/her understanding and acceptance.       Plan Discussed with: CRNA  Anesthesia Plan Comments:        Anesthesia Quick Evaluation

## 2020-07-28 ENCOUNTER — Other Ambulatory Visit: Payer: Self-pay

## 2020-07-28 ENCOUNTER — Ambulatory Visit (HOSPITAL_COMMUNITY): Payer: Medicare HMO | Admitting: Certified Registered Nurse Anesthetist

## 2020-07-28 ENCOUNTER — Encounter (HOSPITAL_COMMUNITY): Payer: Self-pay | Admitting: Orthopedic Surgery

## 2020-07-28 ENCOUNTER — Inpatient Hospital Stay (HOSPITAL_COMMUNITY)
Admission: RE | Admit: 2020-07-28 | Discharge: 2020-07-30 | DRG: 470 | Disposition: A | Discharge status: Home Health | Payer: Medicare HMO | Attending: Orthopedic Surgery | Admitting: Orthopedic Surgery

## 2020-07-28 ENCOUNTER — Encounter (HOSPITAL_COMMUNITY)
Admission: RE | Disposition: A | Discharge status: Home Health | Payer: Self-pay | Source: Home / Self Care | Attending: Orthopedic Surgery

## 2020-07-28 DIAGNOSIS — G8929 Other chronic pain: Secondary | ICD-10-CM | POA: Diagnosis not present

## 2020-07-28 DIAGNOSIS — I10 Essential (primary) hypertension: Secondary | ICD-10-CM | POA: Diagnosis not present

## 2020-07-28 DIAGNOSIS — Z853 Personal history of malignant neoplasm of breast: Secondary | ICD-10-CM

## 2020-07-28 DIAGNOSIS — M1712 Unilateral primary osteoarthritis, left knee: Principal | ICD-10-CM | POA: Diagnosis present

## 2020-07-28 DIAGNOSIS — Z96652 Presence of left artificial knee joint: Secondary | ICD-10-CM

## 2020-07-28 DIAGNOSIS — Z20822 Contact with and (suspected) exposure to covid-19: Secondary | ICD-10-CM | POA: Diagnosis present

## 2020-07-28 DIAGNOSIS — R7303 Prediabetes: Secondary | ICD-10-CM | POA: Diagnosis not present

## 2020-07-28 DIAGNOSIS — Z9012 Acquired absence of left breast and nipple: Secondary | ICD-10-CM | POA: Diagnosis not present

## 2020-07-28 DIAGNOSIS — Z841 Family history of disorders of kidney and ureter: Secondary | ICD-10-CM | POA: Diagnosis not present

## 2020-07-28 DIAGNOSIS — Z87891 Personal history of nicotine dependence: Secondary | ICD-10-CM | POA: Diagnosis not present

## 2020-07-28 DIAGNOSIS — G8918 Other acute postprocedural pain: Secondary | ICD-10-CM | POA: Diagnosis not present

## 2020-07-28 DIAGNOSIS — E119 Type 2 diabetes mellitus without complications: Secondary | ICD-10-CM | POA: Diagnosis not present

## 2020-07-28 DIAGNOSIS — Z833 Family history of diabetes mellitus: Secondary | ICD-10-CM

## 2020-07-28 HISTORY — PX: TOTAL KNEE ARTHROPLASTY: SHX125

## 2020-07-28 LAB — GLUCOSE, CAPILLARY: Glucose-Capillary: 190 mg/dL — ABNORMAL HIGH (ref 70–99)

## 2020-07-28 SURGERY — ARTHROPLASTY, KNEE, TOTAL
Anesthesia: Monitor Anesthesia Care | Site: Knee | Laterality: Left

## 2020-07-28 MED ORDER — SODIUM CHLORIDE 0.9 % IV SOLN: INTRAVENOUS | Status: DC

## 2020-07-28 MED ORDER — ROPIVACAINE HCL 5 MG/ML IJ SOLN
INTRAMUSCULAR | Status: DC | PRN
  Administered 2020-07-28: 30 mL via PERINEURAL

## 2020-07-28 MED ORDER — HYDROMORPHONE HCL 1 MG/ML IJ SOLN
INTRAMUSCULAR | Status: AC
Start: 2020-07-28 — End: 2020-07-29
  Filled 2020-07-28: qty 1

## 2020-07-28 MED ORDER — BUPIVACAINE IN DEXTROSE 0.75-8.25 % IT SOLN
INTRATHECAL | Status: DC | PRN
  Administered 2020-07-28: 1.6 mL via INTRATHECAL

## 2020-07-28 MED ORDER — ACETAMINOPHEN 325 MG PO TABS: 325.0000 mg | ORAL_TABLET | Freq: Four times a day (QID) | ORAL | Status: DC | PRN

## 2020-07-28 MED ORDER — OXYCODONE-ACETAMINOPHEN 5-325 MG PO TABS: 1.0000 | ORAL_TABLET | ORAL | 0 refills | Status: AC | PRN

## 2020-07-28 MED ORDER — METHOCARBAMOL 500 MG IVPB - SIMPLE MED
INTRAVENOUS | Status: AC
  Filled 2020-07-28: qty 50

## 2020-07-28 MED ORDER — ONDANSETRON HCL 4 MG/2ML IJ SOLN
INTRAMUSCULAR | Status: AC
  Filled 2020-07-28: qty 2

## 2020-07-28 MED ORDER — ACETAMINOPHEN 500 MG PO TABS
1000.0000 mg | ORAL_TABLET | Freq: Once | ORAL | Status: AC
  Administered 2020-07-28: 1000 mg via ORAL
  Filled 2020-07-28: qty 2

## 2020-07-28 MED ORDER — KETOROLAC TROMETHAMINE 30 MG/ML IJ SOLN: 15.0000 mg | Freq: Once | INTRAMUSCULAR | Status: DC | PRN

## 2020-07-28 MED ORDER — 0.9 % SODIUM CHLORIDE (POUR BTL) OPTIME
TOPICAL | Status: DC | PRN
  Administered 2020-07-28: 1000 mL

## 2020-07-28 MED ORDER — MIDAZOLAM HCL 2 MG/2ML IJ SOLN
INTRAMUSCULAR | Status: AC
  Administered 2020-07-28: 2 mg via INTRAVENOUS
  Filled 2020-07-28: qty 2

## 2020-07-28 MED ORDER — HYDROMORPHONE HCL 1 MG/ML IJ SOLN
0.2500 mg | INTRAMUSCULAR | Status: DC | PRN
  Administered 2020-07-28 (×4): 0.5 mg via INTRAVENOUS

## 2020-07-28 MED ORDER — LACTATED RINGERS IV SOLN
INTRAVENOUS | Status: DC
  Administered 2020-07-28: 1000 mL via INTRAVENOUS

## 2020-07-28 MED ORDER — VITAMIN B-12 1000 MCG PO TABS
500.0000 ug | ORAL_TABLET | Freq: Every day | ORAL | Status: DC
  Administered 2020-07-28 – 2020-07-30 (×3): 500 ug via ORAL
  Filled 2020-07-28 (×3): qty 1

## 2020-07-28 MED ORDER — POLYETHYLENE GLYCOL 3350 17 G PO PACK: 17.0000 g | PACK | Freq: Every day | ORAL | Status: DC | PRN

## 2020-07-28 MED ORDER — CEFAZOLIN SODIUM-DEXTROSE 2-4 GM/100ML-% IV SOLN
2.0000 g | INTRAVENOUS | Status: AC
  Administered 2020-07-28: 2 g via INTRAVENOUS
  Filled 2020-07-28: qty 100

## 2020-07-28 MED ORDER — TRANEXAMIC ACID-NACL 1000-0.7 MG/100ML-% IV SOLN
1000.0000 mg | Freq: Once | INTRAVENOUS | Status: AC
  Administered 2020-07-28: 1000 mg via INTRAVENOUS
  Filled 2020-07-28: qty 100

## 2020-07-28 MED ORDER — FENTANYL CITRATE (PF) 100 MCG/2ML IJ SOLN
INTRAMUSCULAR | Status: AC
  Filled 2020-07-28: qty 2

## 2020-07-28 MED ORDER — INSULIN ASPART 100 UNIT/ML ~~LOC~~ SOLN: 3.0000 [IU] | Freq: Three times a day (TID) | SUBCUTANEOUS | Status: DC

## 2020-07-28 MED ORDER — ASCORBIC ACID 500 MG PO TABS
500.0000 mg | ORAL_TABLET | Freq: Every day | ORAL | Status: DC
  Administered 2020-07-28 – 2020-07-30 (×3): 500 mg via ORAL
  Filled 2020-07-28 (×3): qty 1

## 2020-07-28 MED ORDER — PROPOFOL 10 MG/ML IV BOLUS
INTRAVENOUS | Status: AC
  Filled 2020-07-28: qty 20

## 2020-07-28 MED ORDER — ASPIRIN 81 MG PO CHEW
81.0000 mg | CHEWABLE_TABLET | Freq: Two times a day (BID) | ORAL | Status: DC
  Administered 2020-07-28 – 2020-07-30 (×4): 81 mg via ORAL
  Filled 2020-07-28 (×4): qty 1

## 2020-07-28 MED ORDER — METHOCARBAMOL 500 MG PO TABS: 500.0000 mg | ORAL_TABLET | Freq: Three times a day (TID) | ORAL | 1 refills | Status: DC | PRN

## 2020-07-28 MED ORDER — OXYCODONE HCL 5 MG/5ML PO SOLN: 5.0000 mg | Freq: Once | ORAL | Status: DC | PRN

## 2020-07-28 MED ORDER — WATER FOR IRRIGATION, STERILE IR SOLN
Status: DC | PRN
  Administered 2020-07-28 (×2): 1000 mL

## 2020-07-28 MED ORDER — OXYCODONE HCL 5 MG PO TABS: 5.0000 mg | ORAL_TABLET | Freq: Once | ORAL | Status: DC | PRN

## 2020-07-28 MED ORDER — METHOCARBAMOL 500 MG IVPB - SIMPLE MED
500.0000 mg | Freq: Four times a day (QID) | INTRAVENOUS | Status: DC | PRN
  Administered 2020-07-28: 500 mg via INTRAVENOUS
  Filled 2020-07-28: qty 50

## 2020-07-28 MED ORDER — PHENOL 1.4 % MT LIQD: 1.0000 | OROMUCOSAL | Status: DC | PRN

## 2020-07-28 MED ORDER — TRANEXAMIC ACID-NACL 1000-0.7 MG/100ML-% IV SOLN
1000.0000 mg | INTRAVENOUS | Status: AC
  Administered 2020-07-28: 1000 mg via INTRAVENOUS
  Filled 2020-07-28: qty 100

## 2020-07-28 MED ORDER — FENTANYL CITRATE (PF) 100 MCG/2ML IJ SOLN
INTRAMUSCULAR | Status: AC
  Administered 2020-07-28: 50 ug via INTRAVENOUS
  Filled 2020-07-28: qty 2

## 2020-07-28 MED ORDER — OXYCODONE HCL 5 MG PO TABS
5.0000 mg | ORAL_TABLET | ORAL | Status: DC | PRN
  Administered 2020-07-28 – 2020-07-29 (×2): 5 mg via ORAL
  Administered 2020-07-29: 10 mg via ORAL
  Administered 2020-07-29: 5 mg via ORAL
  Administered 2020-07-29 – 2020-07-30 (×3): 10 mg via ORAL
  Filled 2020-07-28 (×2): qty 1
  Filled 2020-07-28 (×2): qty 2
  Filled 2020-07-28: qty 1
  Filled 2020-07-28 (×2): qty 2

## 2020-07-28 MED ORDER — FENTANYL CITRATE (PF) 250 MCG/5ML IJ SOLN
INTRAMUSCULAR | Status: DC | PRN
  Administered 2020-07-28: 50 ug via INTRAVENOUS

## 2020-07-28 MED ORDER — CEFAZOLIN SODIUM-DEXTROSE 2-4 GM/100ML-% IV SOLN
2.0000 g | Freq: Four times a day (QID) | INTRAVENOUS | Status: AC
  Administered 2020-07-28 – 2020-07-29 (×2): 2 g via INTRAVENOUS
  Filled 2020-07-28 (×2): qty 100

## 2020-07-28 MED ORDER — CALCIUM CARBONATE 1250 (500 CA) MG PO TABS
1.0000 | ORAL_TABLET | Freq: Every day | ORAL | Status: DC
  Administered 2020-07-29 – 2020-07-30 (×2): 500 mg via ORAL
  Filled 2020-07-28 (×2): qty 1

## 2020-07-28 MED ORDER — SODIUM CHLORIDE 0.9 % IR SOLN
Status: DC | PRN
  Administered 2020-07-28: 3000 mL

## 2020-07-28 MED ORDER — ONDANSETRON HCL 4 MG/2ML IJ SOLN
4.0000 mg | Freq: Four times a day (QID) | INTRAMUSCULAR | Status: DC | PRN
  Administered 2020-07-29: 4 mg via INTRAVENOUS
  Filled 2020-07-28: qty 2

## 2020-07-28 MED ORDER — HYDROCHLOROTHIAZIDE 25 MG PO TABS
12.5000 mg | ORAL_TABLET | Freq: Every day | ORAL | Status: DC
  Administered 2020-07-28 – 2020-07-30 (×3): 12.5 mg via ORAL
  Filled 2020-07-28 (×3): qty 1

## 2020-07-28 MED ORDER — DOCUSATE SODIUM 100 MG PO CAPS
100.0000 mg | ORAL_CAPSULE | Freq: Two times a day (BID) | ORAL | Status: DC
  Administered 2020-07-28 – 2020-07-30 (×3): 100 mg via ORAL
  Filled 2020-07-28 (×3): qty 1

## 2020-07-28 MED ORDER — VITAMIN D 25 MCG (1000 UNIT) PO TABS
1000.0000 [IU] | ORAL_TABLET | Freq: Two times a day (BID) | ORAL | Status: DC
  Administered 2020-07-28 – 2020-07-30 (×3): 1000 [IU] via ORAL
  Filled 2020-07-28 (×3): qty 1

## 2020-07-28 MED ORDER — ASPIRIN 81 MG PO CHEW: 81.0000 mg | CHEWABLE_TABLET | Freq: Two times a day (BID) | ORAL | 0 refills | Status: AC

## 2020-07-28 MED ORDER — ONDANSETRON HCL 4 MG/2ML IJ SOLN
4.0000 mg | Freq: Once | INTRAMUSCULAR | Status: AC | PRN
  Administered 2020-07-28: 4 mg via INTRAVENOUS

## 2020-07-28 MED ORDER — CHLORHEXIDINE GLUCONATE 0.12 % MT SOLN
15.0000 mL | Freq: Once | OROMUCOSAL | Status: AC
  Administered 2020-07-28: 15 mL via OROMUCOSAL

## 2020-07-28 MED ORDER — FERROUS SULFATE 325 (65 FE) MG PO TABS
325.0000 mg | ORAL_TABLET | Freq: Three times a day (TID) | ORAL | Status: DC
  Administered 2020-07-29 – 2020-07-30 (×2): 325 mg via ORAL
  Filled 2020-07-28 (×2): qty 1

## 2020-07-28 MED ORDER — ONDANSETRON HCL 4 MG PO TABS: 4.0000 mg | ORAL_TABLET | Freq: Three times a day (TID) | ORAL | 1 refills | Status: AC | PRN

## 2020-07-28 MED ORDER — PROPOFOL 500 MG/50ML IV EMUL
INTRAVENOUS | Status: DC | PRN
  Administered 2020-07-28: 100 ug/kg/min via INTRAVENOUS

## 2020-07-28 MED ORDER — METOCLOPRAMIDE HCL 5 MG/ML IJ SOLN
5.0000 mg | Freq: Three times a day (TID) | INTRAMUSCULAR | Status: DC | PRN
  Administered 2020-07-28 – 2020-07-29 (×3): 10 mg via INTRAVENOUS
  Filled 2020-07-28 (×3): qty 2

## 2020-07-28 MED ORDER — PROPOFOL 500 MG/50ML IV EMUL
INTRAVENOUS | Status: AC
  Filled 2020-07-28: qty 50

## 2020-07-28 MED ORDER — DEXAMETHASONE SODIUM PHOSPHATE 10 MG/ML IJ SOLN
INTRAMUSCULAR | Status: DC | PRN
  Administered 2020-07-28: 10 mg

## 2020-07-28 MED ORDER — POVIDONE-IODINE 10 % EX SWAB
2.0000 "application " | Freq: Once | CUTANEOUS | Status: AC
  Administered 2020-07-28: 2 via TOPICAL

## 2020-07-28 MED ORDER — PROPOFOL 1000 MG/100ML IV EMUL
INTRAVENOUS | Status: AC
  Filled 2020-07-28: qty 100

## 2020-07-28 MED ORDER — MENTHOL 3 MG MT LOZG: 1.0000 | LOZENGE | OROMUCOSAL | Status: DC | PRN

## 2020-07-28 MED ORDER — POLYVINYL ALCOHOL 1.4 % OP SOLN
1.0000 [drp] | Freq: Every day | OPHTHALMIC | Status: DC
  Administered 2020-07-28 – 2020-07-29 (×2): 1 [drp] via OPHTHALMIC
  Filled 2020-07-28: qty 15

## 2020-07-28 MED ORDER — HYDROMORPHONE HCL 1 MG/ML IJ SOLN
0.5000 mg | INTRAMUSCULAR | Status: DC | PRN
  Administered 2020-07-29 (×3): 1 mg via INTRAVENOUS
  Filled 2020-07-28 (×3): qty 1

## 2020-07-28 MED ORDER — MIDAZOLAM HCL 2 MG/2ML IJ SOLN: 1.0000 mg | INTRAMUSCULAR | Status: DC

## 2020-07-28 MED ORDER — METOCLOPRAMIDE HCL 5 MG PO TABS
5.0000 mg | ORAL_TABLET | Freq: Three times a day (TID) | ORAL | Status: DC | PRN
  Administered 2020-07-30: 5 mg via ORAL
  Filled 2020-07-28: qty 1

## 2020-07-28 MED ORDER — ONDANSETRON HCL 4 MG PO TABS
4.0000 mg | ORAL_TABLET | Freq: Four times a day (QID) | ORAL | Status: DC | PRN
  Filled 2020-07-28: qty 1

## 2020-07-28 MED ORDER — METHOCARBAMOL 500 MG PO TABS
500.0000 mg | ORAL_TABLET | Freq: Four times a day (QID) | ORAL | Status: DC | PRN
  Administered 2020-07-28 – 2020-07-29 (×3): 500 mg via ORAL
  Filled 2020-07-28 (×3): qty 1

## 2020-07-28 MED ORDER — BISACODYL 10 MG RE SUPP: 10.0000 mg | Freq: Every day | RECTAL | Status: DC | PRN

## 2020-07-28 MED ORDER — ORAL CARE MOUTH RINSE: 15.0000 mL | Freq: Once | OROMUCOSAL | Status: AC

## 2020-07-28 MED ORDER — INSULIN ASPART 100 UNIT/ML ~~LOC~~ SOLN: 0.0000 [IU] | Freq: Three times a day (TID) | SUBCUTANEOUS | Status: DC

## 2020-07-28 MED ORDER — VALACYCLOVIR HCL 500 MG PO TABS
1000.0000 mg | ORAL_TABLET | Freq: Two times a day (BID) | ORAL | Status: DC | PRN
  Filled 2020-07-28: qty 2

## 2020-07-28 MED ORDER — FENTANYL CITRATE (PF) 100 MCG/2ML IJ SOLN: 50.0000 ug | INTRAMUSCULAR | Status: DC

## 2020-07-28 SURGICAL SUPPLY — 53 items
BAG ZIPLOCK 12X15 (MISCELLANEOUS) IMPLANT
BLADE SAG 18X100X1.27 (BLADE) ×2 IMPLANT
BLADE SAW SGTL 13X75X1.27 (BLADE) ×2 IMPLANT
BNDG ELASTIC 6X10 VLCR STRL LF (GAUZE/BANDAGES/DRESSINGS) ×2 IMPLANT
BNDG GAUZE ELAST 4 BULKY (GAUZE/BANDAGES/DRESSINGS) ×2 IMPLANT
BOWL SMART MIX CTS (DISPOSABLE) ×2 IMPLANT
CEMENT HV SMART SET (Cement) ×4 IMPLANT
CEMENT TIBIA MBT SIZE 2.5 (Knees) ×1 IMPLANT
CLSR STERI-STRIP ANTIMIC 1/2X4 (GAUZE/BANDAGES/DRESSINGS) ×2 IMPLANT
COVER SURGICAL LIGHT HANDLE (MISCELLANEOUS) ×2 IMPLANT
COVER WAND RF STERILE (DRAPES) IMPLANT
CUFF TOURN SGL QUICK 34 (TOURNIQUET CUFF) ×2
CUFF TRNQT CYL 34X4.125X (TOURNIQUET CUFF) ×1 IMPLANT
DRAPE SHEET LG 3/4 BI-LAMINATE (DRAPES) ×2 IMPLANT
DRAPE U-SHAPE 47X51 STRL (DRAPES) ×2 IMPLANT
DRSG ADAPTIC 3X8 NADH LF (GAUZE/BANDAGES/DRESSINGS) ×2 IMPLANT
DRSG PAD ABDOMINAL 8X10 ST (GAUZE/BANDAGES/DRESSINGS) ×2 IMPLANT
DURAPREP 26ML APPLICATOR (WOUND CARE) ×2 IMPLANT
ELECT REM PT RETURN 15FT ADLT (MISCELLANEOUS) ×2 IMPLANT
GAUZE SPONGE 4X4 12PLY STRL (GAUZE/BANDAGES/DRESSINGS) ×2 IMPLANT
GLOVE BIOGEL PI ORTHO PRO 7.5 (GLOVE) ×1
GLOVE BIOGEL PI ORTHO PRO SZ8 (GLOVE) ×1
GLOVE ORTHO TXT STRL SZ7.5 (GLOVE) ×2 IMPLANT
GLOVE PI ORTHO PRO STRL 7.5 (GLOVE) ×1 IMPLANT
GLOVE PI ORTHO PRO STRL SZ8 (GLOVE) ×1 IMPLANT
GLOVE SURG ORTHO 8.5 STRL (GLOVE) ×4 IMPLANT
GOWN STRL REUS W/TWL XL LVL3 (GOWN DISPOSABLE) ×4 IMPLANT
HANDPIECE INTERPULSE COAX TIP (DISPOSABLE) ×2
HOLDER FOLEY CATH W/STRAP (MISCELLANEOUS) IMPLANT
IMMOBILIZER KNEE 20 (SOFTGOODS) ×2
IMMOBILIZER KNEE 20 THIGH 36 (SOFTGOODS) ×1 IMPLANT
IMPL FEMUR SIGMA LT PS SZ 3 (Knees) ×1 IMPLANT
IMPLANT FEMUR SIGMA LT PS SZ 3 (Knees) ×2 IMPLANT
KIT TURNOVER KIT A (KITS) IMPLANT
MANIFOLD NEPTUNE II (INSTRUMENTS) ×2 IMPLANT
NS IRRIG 1000ML POUR BTL (IV SOLUTION) ×2 IMPLANT
PACK TOTAL KNEE CUSTOM (KITS) ×2 IMPLANT
PATELLA DOME PFC 32MM (Knees) ×2 IMPLANT
PENCIL SMOKE EVACUATOR (MISCELLANEOUS) IMPLANT
PIN STEINMAN FIXATION KNEE (PIN) ×2 IMPLANT
PLATE ROT INSERT 12.5MM SIZE 3 (Plate) ×2 IMPLANT
PROTECTOR NERVE ULNAR (MISCELLANEOUS) ×2 IMPLANT
SET HNDPC FAN SPRY TIP SCT (DISPOSABLE) ×1 IMPLANT
STAPLER VISISTAT 35W (STAPLE) IMPLANT
STRIP CLOSURE SKIN 1/2X4 (GAUZE/BANDAGES/DRESSINGS) ×4 IMPLANT
SUT MNCRL AB 3-0 PS2 18 (SUTURE) ×2 IMPLANT
SUT VIC AB 0 CT1 36 (SUTURE) ×2 IMPLANT
SUT VIC AB 1 CT1 36 (SUTURE) ×6 IMPLANT
SUT VIC AB 2-0 CT1 27 (SUTURE) ×2
SUT VIC AB 2-0 CT1 TAPERPNT 27 (SUTURE) ×1 IMPLANT
TIBIA MBT CEMENT SIZE 2.5 (Knees) ×2 IMPLANT
TRAY FOLEY MTR SLVR 16FR STAT (SET/KITS/TRAYS/PACK) ×2 IMPLANT
WATER STERILE IRR 1000ML POUR (IV SOLUTION) ×4 IMPLANT

## 2020-07-28 NOTE — Progress Notes (Signed)
Orthopedic Tech Progress Note Patient Details:  Yvonne Barron 1946/11/08 [id number]  CPM Left Knee CPM Left Knee: On Left Knee Flexion (Degrees): 90 Left Knee Extension (Degrees): 0 Additional Comments: foot roll  Post Interventions Instructions Provided: Care of device  [place] Pink 07/28/2020, 5:31 PM

## 2020-07-28 NOTE — Anesthesia Postprocedure Evaluation (Signed)
Anesthesia Post Note  Patient: Yvonne Barron  Procedure(s) Performed: TOTAL KNEE ARTHROPLASTY (Left Knee)     Patient location during evaluation: PACU Anesthesia Type: Regional Level of consciousness: oriented and awake and alert Pain management: pain level controlled Vital Signs Assessment: post-procedure vital signs reviewed and stable Respiratory status: spontaneous breathing, respiratory function stable and patient connected to nasal cannula oxygen Cardiovascular status: blood pressure returned to baseline and stable Postop Assessment: no headache, no backache and no apparent nausea or vomiting Anesthetic complications: no   No complications documented.  Last Vitals:  Vitals:   07/28/20 1615 07/28/20 1630  BP: 111/76 (!) 114/92  Pulse: 80 67  Resp: 18 12  Temp:    SpO2: 100% 99%    Last Pain:  Vitals:   07/28/20 1607  TempSrc:   PainSc: 0-No pain                 Daved Mcfann COKER

## 2020-07-28 NOTE — Brief Op Note (Signed)
07/28/2020  4:12 PM  PATIENT:  Yvonne Barron  73 y.o. female  PRE-OPERATIVE DIAGNOSIS:  Left knee end stage osteoarthritis  POST-OPERATIVE DIAGNOSIS:  Left knee end stage osteoarthritis  PROCEDURE:  Procedure(s): TOTAL KNEE ARTHROPLASTY (Left) DePuy Sigma RP  SURGEON:  Surgeon(s) and Role:    Netta Cedars, MD - Primary  PHYSICIAN ASSISTANT:   ASSISTANTS: Ventura Bruns, PA-C    ANESTHESIA:   regional and spinal  EBL:  10 mL   BLOOD ADMINISTERED:none  DRAINS: none   LOCAL MEDICATIONS USED:  NONE  SPECIMEN:  No Specimen  DISPOSITION OF SPECIMEN:  N/A  COUNTS:  YES  TOURNIQUET:   Total Tourniquet Time Documented: Thigh (Left) - 104 minutes Total: Thigh (Left) - 104 minutes   DICTATION: .Other Dictation: Dictation Number [id number]  PLAN OF CARE: Admit to inpatient   PATIENT DISPOSITION:  PACU - hemodynamically stable.   Delay start of Pharmacological VTE agent (>24hrs) due to surgical blood loss or risk of bleeding: no

## 2020-07-28 NOTE — Anesthesia Procedure Notes (Signed)
Anesthesia Regional Block: Adductor canal block   Pre-Anesthetic Checklist: ,, timeout performed, Correct Patient, Correct Site, Correct Laterality, Correct Procedure, Correct Position, site marked, Risks and benefits discussed,  Surgical consent,  Pre-op evaluation,  At surgeon's request and post-op pain management  Laterality: Left  Prep: Maximum Sterile Barrier Precautions used, chloraprep       Needles:  Injection technique: Single-shot  Needle Type: Echogenic Stimulator Needle     Needle Length: 9cm  Needle Gauge: 22     Additional Needles:   Procedures:,,,, ultrasound used (permanent image in chart),,,,  Narrative:  Start time: 07/28/2020 12:50 PM End time: 07/28/2020 12:57 PM Injection made incrementally with aspirations every 5 mL.  Performed by: Personally  Anesthesiologist: Pervis Hocking, DO  Additional Notes: Monitors applied. No increased pain on injection. No increased resistance to injection. Injection made in 5cc increments. Good needle visualization. Patient tolerated procedure well.

## 2020-07-28 NOTE — Transfer of Care (Signed)
Immediate Anesthesia Transfer of Care Note  Patient: Shaida Route  Procedure(s) Performed: TOTAL KNEE ARTHROPLASTY (Left Knee)  Patient Location: PACU  Anesthesia Type:Spinal  Level of Consciousness: awake and patient cooperative  Airway & Oxygen Therapy: Patient Spontanous Breathing and Patient connected to face mask oxygen  Post-op Assessment: Report given to RN and Post -op Vital signs reviewed and stable  Post vital signs: Reviewed and stable  Last Vitals:  Vitals Value Taken Time  BP 107/64 07/28/20 1607  Temp    Pulse 83 07/28/20 1608  Resp 16 07/28/20 1608  SpO2 92 % 07/28/20 1608  Vitals shown include unvalidated device data.  Last Pain:  Vitals:   07/28/20 1134  TempSrc:   PainSc: 0-No pain         Complications: No complications documented.

## 2020-07-28 NOTE — Care Plan (Signed)
Ortho Bundle Case Management Note  Patient Details  Name: Radha Coggins MRN: [id number] Date of Birth: [birth year]  L TKA on 07-28-20 DCP:  Home with family.  1 story home with 6 ste. DME:  RW and 3-in-1 ordered through [facility] PT:  [facility]. PT eval scheduled on 08-01-20 at 7:45 am.                  DME Arranged:  Walker rolling, 3-N-1 DME Agency:  [employer]  HH Arranged:  NA HH Agency:  NA  Additional Comments: Please contact me with any questions of if this plan should need to change.  Marianne Sofia, RN,CCM [facility]  [phone] 07/28/2020, 3:06 PM

## 2020-07-28 NOTE — Progress Notes (Signed)
Assisted Dr. Beth Finucane with left, ultrasound guided, adductor canal block. Side rails up, monitors on throughout procedure. See vital signs in flow sheet. Tolerated Procedure well. ° °

## 2020-07-28 NOTE — Anesthesia Procedure Notes (Signed)
Spinal  Start time: 07/28/2020 1:47 PM End time: 07/28/2020 1:53 PM Staffing Performed: resident/CRNA  Resident/CRNA: Lieutenant Diego, CRNA Preanesthetic Checklist Completed: patient identified, IV checked, site marked, risks and benefits discussed, surgical consent, monitors and equipment checked, pre-op evaluation and timeout performed Spinal Block Patient position: sitting Prep: DuraPrep Patient monitoring: heart rate, continuous pulse ox and blood pressure Approach: midline Location: L2-3 Injection technique: single-shot Needle Needle type: Pencan  Needle gauge: 24 G Needle length: 10 cm Assessment Sensory level: T4 Additional Notes Free flow CSF, aspirated prior to injection and after injection.

## 2020-07-28 NOTE — Interval H&P Note (Signed)
History and Physical Interval Note:  07/28/2020 1:21 PM  Yvonne Barron  has presented today for surgery, with the diagnosis of Left knee end stage osteoarthritis.  The various methods of treatment have been discussed with the patient and family. After consideration of risks, benefits and other options for treatment, the patient has consented to  Procedure(s): TOTAL KNEE ARTHROPLASTY (Left) as a surgical intervention.  The patient's history has been reviewed, patient examined, no change in status, stable for surgery.  I have reviewed the patient's chart and labs.  Questions were answered to the patient's satisfaction.     [name]

## 2020-07-28 NOTE — Discharge Instructions (Signed)
Ice to the knee constantly.  Keep the incision covered and clean and dry for one week, then ok to get it wet in the shower.  Do exercise as instructed every hour, please to prevent stiffness.    DO NOT prop anything under the knee, it will make your knee stiff.  Prop under the ankle to encourage your knee to go straight.   Use the walker while you are up and around for balance.  Wear your support stockings 24/7 to prevent blood clots and take baby aspirin twice daily for 30 days also to prevent blood clots  Follow up with Dr Adrien Dietzman in two weeks in the office, call [phone] for appt  

## 2020-07-29 LAB — CBC
HCT: 35.8 % — ABNORMAL LOW (ref 36.0–46.0)
Hemoglobin: 11 g/dL — ABNORMAL LOW (ref 12.0–15.0)
MCH: 27.6 pg (ref 26.0–34.0)
MCHC: 30.7 g/dL (ref 30.0–36.0)
MCV: 89.7 fL (ref 80.0–100.0)
Platelets: 277 10*3/uL (ref 150–400)
RBC: 3.99 MIL/uL (ref 3.87–5.11)
RDW: 14.2 % (ref 11.5–15.5)
WBC: 10.7 10*3/uL — ABNORMAL HIGH (ref 4.0–10.5)
nRBC: 0 % (ref 0.0–0.2)

## 2020-07-29 LAB — BASIC METABOLIC PANEL
Anion gap: 14 (ref 5–15)
BUN: 11 mg/dL (ref 8–23)
CO2: 23 mmol/L (ref 22–32)
Calcium: 8.8 mg/dL — ABNORMAL LOW (ref 8.9–10.3)
Chloride: 99 mmol/L (ref 98–111)
Creatinine, Ser: 0.74 mg/dL (ref 0.44–1.00)
GFR calc Af Amer: >60 mL/min (ref >60)
GFR calc non Af Amer: >60 mL/min (ref >60)
Glucose, Bld: 189 mg/dL — ABNORMAL HIGH (ref 70–99)
Potassium: 3.5 mmol/L (ref 3.5–5.1)
Sodium: 136 mmol/L (ref 135–145)

## 2020-07-29 LAB — GLUCOSE, CAPILLARY
Glucose-Capillary: 107 mg/dL — ABNORMAL HIGH (ref 70–99)
Glucose-Capillary: 106 mg/dL — ABNORMAL HIGH (ref 70–99)
Glucose-Capillary: 112 mg/dL — ABNORMAL HIGH (ref 70–99)
Glucose-Capillary: 144 mg/dL — ABNORMAL HIGH (ref 70–99)

## 2020-07-29 NOTE — TOC Progression Note (Signed)
Transition of Care [facility]) - Progression Note    Patient Details  Name: Yvonne Barron MRN: [id number] Date of Birth: 03/23/1946  Transition of [facility]) CM/SW Contact  Joaquin Courts, RN Phone Number: 07/29/2020, 9:48 AM  Clinical Narrative:    Patient plans to dc home with outpatient physical therapy.  [employer] to deliver 3in1 and RW to bedside for home use.    Expected Discharge Plan: Home/Self Care    Expected Discharge Plan and Services Expected Discharge Plan: Home/Self Care   Discharge Planning Services: CM Consult   Living arrangements for the past 2 months: Single Family Home                 DME Arranged: Walker rolling, 3-N-1 DME Agency: [employer] Date DME Agency Contacted: 07/29/20 Time DME Agency Contacted: [phone] Representative spoke with at DME Agency: [employer] on call referral line HH Arranged: NA [place] Agency: NA         Social Determinants of Health (SDOH) Interventions    Readmission Risk Interventions No flowsheet data found.

## 2020-07-29 NOTE — Op Note (Signed)
NAME: Yvonne Barron, Yvonne Barron MEDICAL RECORD AT:[id number] ACCOUNT [id number] DATE OF BIRTH:08-Oct-1946 FACILITY: [facility] LOCATION: [facility] PHYSICIAN:STEVEN Orlena Sheldon, MD  OPERATIVE REPORT  DATE OF PROCEDURE:  07/28/2020  PREOPERATIVE DIAGNOSIS:  Left knee end-stage arthritis.  POSTOPERATIVE DIAGNOSIS:  Left knee end-stage arthritis.  PROCEDURE PERFORMED:  Left total knee replacement using DePuy Sigma rotating platform prosthesis.  ATTENDING SURGEON:  Esmond Plants, MD  ASSISTANT:  Darol Destine, [place], who was scrubbed during the entire procedure and necessary for satisfactory completion of surgery.  ANESTHESIA:  Spinal anesthesia plus adductor canal block was used.  ESTIMATED BLOOD LOSS:  Minimal.  FLUID REPLACEMENT:  1500 mL crystalloid.  INSTRUMENT COUNTS:  Correct.  COMPLICATIONS:  No complications.  ANTIBIOTICS:  Perioperative antibiotics were given.  INDICATIONS:  The patient is a 74 year old female with worsening left knee pain secondary to end-stage arthritis.  The patient has had a failed extended period of conservative management and now has ongoing worsening pain and function and desires total  knee arthroplasty to restore mechanical alignment to her knee and eliminate pain and to improve her function.  Risks including but not limited to infection and blood clots were discussed in detail with the patient.  She would like to move forward with  surgery.  Informed consent was obtained.  DESCRIPTION OF PROCEDURE:  After an adequate level of anesthesia was achieved, the patient was positioned in the supine position.  Left leg correctly identified.  Nonsterile tourniquet placed on the proximal thigh.  The left leg sterilely prepped and  draped in the usual manner.  Time-out called, verifying correct patient, correct site and we elevated the leg and exsanguinated using Esmarch bandage and then placed the knee in flexion.  A tourniquet was elevated to 300 mmHg.  We performed a   longitudinal midline incision with a 10 blade scalpel.  Dissection down through subcutaneous tissues using the 10 blade.  We performed a medial parapatellar arthrotomy and then divided lateral patellofemoral ligaments.  We everted the patella.  There was  bone-on-bone wear noted over the distal medial and lateral femoral condyles.  At this point, we entered the distal femur with a step cut drill.  We then placed our intramedullary resection guide initially resecting 10 mm off this distal femur set on 3  degrees left.    The patient already had some slight valgus alignment, so we went ahead and did 3 degrees left.  I did our distal femoral resection.  We then sized our femur to a size 3 anterior down and performed anterior, posterior and chamfer cuts.   Resected ACL, PCL, meniscal tissue, subluxed the tibia anteriorly and performed our tibial cut with the external alignment jig 90 degrees perpendicular to the long axis of the tibia with minimal posterior slope for this posterior cruciate substituting  prosthesis.  Once we had our proximal tibial cut performed, we placed a laminar spreader, removed excess posterior osteophytes with the curved osteotome.  We next checked our gaps, which were 12.5 mm in flexion and 10 mm in extension.  Thus, we decided  to go ahead and take 2 more millimeters of distal femoral bone.  We placed our jig, set 2 more millimeters of resection and resected the distal femur.  We then recut our chamfer cuts and at this point completed our tibial preparation with the modular  drill and keel punch for the size 2.5 tibia and then we did our box cut for the 3 left femur.  We impacted the 3 left femur  in place, reduced the knee with a 12.5 poly trial and were happy with that soft tissue balancing.  We were able to get into full  extension with just a little bit of pressure.  We went ahead and resurfaced the patella going from 22 mm thickness down to 14 mm thickness.  We drilled the lug  holes for the 32 patellar button and then we placed a trial patellar button in place and  ranged the knee, which had excellent stability.  We went ahead and did a lateral release as there was just a little bit of slight lateral tilt, but tracking was improved with a lateral release.  We irrigated thoroughly.  We then removed all trial  components, vacuum mixed the high viscosity cement and pulse irrigated the bone and dried it well.  We then cemented the components into place, a 2.5 tibia, 3 left femur and then placed our 12.5 trial, placed the knee in extension and held that while the  cement set up, we removed all excess cement.  Once the cement was hardened with 1/4 inch curved osteotome and a mallet, we then went ahead and selected the real 12.5 poly, placed that on the tibia and reduced the knee.  Again, stable in flexion and  extension.  We repaired the parapatellar arthrotomy with #1 Vicryl suture followed by 0 and 2-0 layered subcutaneous closure and a 4-0 Monocryl for skin.  Steri-Strips applied followed by sterile dressing.  The patient tolerated surgery well.  PN/NUANCE  D:07/28/2020 T:07/29/2020 JOB:[id number]

## 2020-07-29 NOTE — Plan of Care (Signed)
Continuing to work with patient to decrease pain level by offering medication when due and teaching relaxation techniques. Deanna Artis, RN

## 2020-07-29 NOTE — Evaluation (Signed)
Physical Therapy Evaluation Patient Details Name: Yvonne Barron MRN: [id number] DOB: 1946/05/04 Today's Date: 07/29/2020   History of Present Illness  Pt s/p L TKR and with hx of breast CA s/p L mastectomy  Clinical Impression  Pt s/p L TKR and presents with decreased L LE strength/ROM, post op pain and ongoing N/V.  Pt should progress to dc home with assist of family and follow up HHPT.    Follow Up Recommendations Home health PT    Equipment Recommendations  Rolling walker with 5" wheels;3in1 (PT)    Recommendations for Other Services       Precautions / Restrictions Precautions Precautions: Fall;Knee Required Braces or Orthoses: Knee Immobilizer - Left Knee Immobilizer - Left: Discontinue once straight leg raise with < 10 degree lag Restrictions Weight Bearing Restrictions: No Other Position/Activity Restrictions: WBAT      Mobility  Bed Mobility Overal bed mobility: Needs Assistance Bed Mobility: Supine to Sit     Supine to sit: Min assist;Mod assist     General bed mobility comments: cues for sequence and use of L LE to self assist.  Increased time 2* nausea  Transfers Overall transfer level: Needs assistance Equipment used: Rolling walker (2 wheeled) Transfers: Sit to/from Stand Sit to Stand: Min assist;Mod assist         General transfer comment: cues for LE management and use of UEs to self assist;  Physical assist to bring wt up and fwd and to balance in initial standing  Ambulation/Gait Ambulation/Gait assistance: Min assist;Mod assist;+2 safety/equipment (chair follow) Gait Distance (Feet): 18 Feet Assistive device: Rolling walker (2 wheeled) Gait Pattern/deviations: Step-to pattern;Decreased step length - right;Decreased step length - left;Shuffle;Trunk flexed Gait velocity: decr   General Gait Details: cues for sequence, posture and position from RW.  Assist with balance and RW management  Stairs            Wheelchair Mobility     Modified Rankin (Stroke Patients Only)       Balance Overall balance assessment: Needs assistance Sitting-balance support: No upper extremity supported;Feet supported Sitting balance-Leahy Scale: Fair     Standing balance support: Bilateral upper extremity supported Standing balance-Leahy Scale: Poor                               Pertinent Vitals/Pain Pain Assessment: 0-10 Pain Score: 5  Pain Location: R knee Pain Descriptors / Indicators: Aching;Sore Pain Intervention(s): Limited activity within patient's tolerance;Monitored during session;Premedicated before session;Ice applied    Home Living Family/patient expects to be discharged to:: Private residence Living Arrangements: Children Available Help at Discharge: Family Type of Home: House Home Access: Stairs to enter Entrance Stairs-Rails: Psychiatric nurse of Steps: 5 Home Layout: One level Home Equipment: None      Prior Function Level of Independence: Independent               Hand Dominance        Extremity/Trunk Assessment   Upper Extremity Assessment Upper Extremity Assessment: Overall WFL for tasks assessed    Lower Extremity Assessment Lower Extremity Assessment: RLE deficits/detail    Cervical / Trunk Assessment Cervical / Trunk Assessment: Normal  Communication   Communication: No difficulties  Cognition Arousal/Alertness: Awake/alert Behavior During Therapy: WFL for tasks assessed/performed Overall Cognitive Status: Within Functional Limits for tasks assessed  General Comments      Exercises Total Joint Exercises Ankle Circles/Pumps: AROM;10 reps;Supine;Both   Assessment/Plan    PT Assessment Patient needs continued PT services  PT Problem List Decreased strength;Decreased range of motion;Decreased activity tolerance;Decreased balance;Decreased mobility;Decreased knowledge of use of  DME;Pain       PT Treatment Interventions DME instruction;Gait training;Stair training;Functional mobility training;Therapeutic activities;Therapeutic exercise;Patient/family education    PT Goals (Current goals can be found in the Care Plan section)  Acute Rehab PT Goals Patient Stated Goal: Regain IND PT Goal Formulation: With patient Time For Goal Achievement: 08/12/20 Potential to Achieve Goals: Good    Frequency 7X/week   Barriers to discharge        Co-evaluation               AM-PAC PT "6 Clicks" Mobility  Outcome Measure Help needed turning from your back to your side while in a flat bed without using bedrails?: A Little Help needed moving from lying on your back to sitting on the side of a flat bed without using bedrails?: A Little Help needed moving to and from a bed to a chair (including a wheelchair)?: A Little Help needed standing up from a chair using your arms (e.g., wheelchair or bedside chair)?: A Little Help needed to walk in hospital room?: A Little Help needed climbing 3-5 steps with a railing? : A Little 6 Click Score: 18    End of Session Equipment Utilized During Treatment: Gait belt;Left knee immobilizer Activity Tolerance: Patient tolerated treatment well;Patient limited by fatigue;Patient limited by pain Patient left: in chair;with call bell/phone within reach;with chair alarm set Nurse Communication: Mobility status PT Visit Diagnosis: Difficulty in walking, not elsewhere classified (R26.2);Unsteadiness on feet (R26.81)    Time: 3734-2876 PT Time Calculation (min) (ACUTE ONLY): 30 min   Charges:   PT Evaluation $PT Eval Low Complexity: 1 Low PT Treatments $Gait Training: 8-22 mins        Debe Coder PT Acute Rehabilitation Services Pager [phone] Office [phone]   Aubrei Bouchie 07/29/2020, 1:07 PM

## 2020-07-29 NOTE — Progress Notes (Signed)
    Subjective:  Patient reports pain as mild to moderate.  Denies CP/SOB. Patient endorses nausea and vomiting overnight  Objective:   VITALS:   Vitals:   07/28/20 1956 07/28/20 2058 07/29/20 0158 07/29/20 0541  BP: (!) 163/94 (!) 141/96 (!) 152/93 (!) 141/75  Pulse: 84 64 78 80  Resp: 16 16 16 14   Temp: 97.6 F (36.4 C) (!) 97.5 F (36.4 C) 98.2 F (36.8 C) 98.3 F (36.8 C)  TempSrc: Oral Oral Oral Oral  SpO2: 95% 96% 98% 93%  Weight:      Height:        NAD ABD soft Neurovascular intact Sensation intact distally Intact pulses distally Dorsiflexion/Plantar flexion intact Incision: dressing C/D/I   Lab Results  Component Value Date   WBC 10.7 (H) 07/29/2020   HGB 11.0 (L) 07/29/2020   HCT 35.8 (L) 07/29/2020   MCV 89.7 07/29/2020   PLT 277 07/29/2020   BMET    Component Value Date/Time   NA 136 07/29/2020 0253   NA 142 06/15/2020 1142   K 3.5 07/29/2020 0253   CL 99 07/29/2020 0253   CO2 23 07/29/2020 0253   GLUCOSE 189 (H) 07/29/2020 0253   BUN 11 07/29/2020 0253   BUN 16 06/15/2020 1142   CREATININE 0.74 07/29/2020 0253   CALCIUM 8.8 (L) 07/29/2020 0253   GFRNONAA >60 07/29/2020 0253   GFRAA >60 07/29/2020 0253     Assessment/Plan: 1 Day Post-Op   Active Problems:   H/O total knee replacement, left   WBAT with walker DVT ppx: Aspirin, SCDs, TEDS PO pain control PT/OT Dispo: D/C planning Bandage changed to [place] 07/29/2020, 8:31 AM [facility] is now [facility] [address] [place], [place], [place] [address] Phone: [phone] [email or web address] Facebook  [employer]

## 2020-07-29 NOTE — Plan of Care (Signed)

## 2020-07-30 LAB — CBC
HCT: 31.3 % — ABNORMAL LOW (ref 36.0–46.0)
Hemoglobin: 9.9 g/dL — ABNORMAL LOW (ref 12.0–15.0)
MCH: 27.2 pg (ref 26.0–34.0)
MCHC: 31.6 g/dL (ref 30.0–36.0)
MCV: 86 fL (ref 80.0–100.0)
Platelets: 258 10*3/uL (ref 150–400)
RBC: 3.64 MIL/uL — ABNORMAL LOW (ref 3.87–5.11)
RDW: 14.2 % (ref 11.5–15.5)
WBC: 12.9 10*3/uL — ABNORMAL HIGH (ref 4.0–10.5)
nRBC: 0 % (ref 0.0–0.2)

## 2020-07-30 LAB — GLUCOSE, CAPILLARY: Glucose-Capillary: 139 mg/dL — ABNORMAL HIGH (ref 70–99)

## 2020-07-30 NOTE — Progress Notes (Signed)
Physical Therapy Treatment Patient Details Name: Yvonne Barron MRN: [id number] DOB: 1946/12/06 Today's Date: 07/30/2020    History of Present Illness Pt s/p L TKR and with hx of breast CA s/p L mastectomy    PT Comments    Pt with marked improvement in activity tolerance and with no c/o dizziness.  Pt ambulated increased distance in hall, negotiated stairs, reviewed don/doff KI, and performed HEP with assist - written instruction provided and reviewed.  Pt eager for dc home   Follow Up Recommendations  Home health PT     Equipment Recommendations  Rolling walker with 5" wheels;3in1 (PT)    Recommendations for Other Services       Precautions / Restrictions Precautions Precautions: Fall;Knee Required Braces or Orthoses: Knee Immobilizer - Left Knee Immobilizer - Left: Discontinue once straight leg raise with < 10 degree lag Restrictions Other Position/Activity Restrictions: WBAT    Mobility  Bed Mobility Overal bed mobility: Needs Assistance Bed Mobility: Supine to Sit     Supine to sit: Min assist;Min guard     General bed mobility comments: cues for sequence and use of L LE to self assist; assist to manage L LE  Transfers Overall transfer level: Needs assistance Equipment used: Rolling walker (2 wheeled) Transfers: Sit to/from Stand Sit to Stand: Min guard         General transfer comment: cues for LE management and use of UEs to self assist  Ambulation/Gait Ambulation/Gait assistance: Min assist;Min guard Gait Distance (Feet): 120 Feet Assistive device: Rolling walker (2 wheeled) Gait Pattern/deviations: Step-to pattern;Decreased step length - right;Decreased step length - left;Shuffle;Trunk flexed Gait velocity: decr   General Gait Details: cues for sequence, posture and position from RW.  Assist with balance and RW management   Stairs Stairs: Yes Stairs assistance: Min assist Stair Management: Two rails;Step to pattern;Forwards Number of  Stairs: 5 General stair comments: cues for sequence`   Wheelchair Mobility    Modified Rankin (Stroke Patients Only)       Balance Overall balance assessment: Needs assistance Sitting-balance support: No upper extremity supported;Feet supported Sitting balance-Leahy Scale: Good     Standing balance support: Bilateral upper extremity supported Standing balance-Leahy Scale: Fair                              Cognition Arousal/Alertness: Awake/alert Behavior During Therapy: WFL for tasks assessed/performed Overall Cognitive Status: Within Functional Limits for tasks assessed                                        Exercises Total Joint Exercises Ankle Circles/Pumps: AROM;10 reps;Supine;Both Quad Sets: AROM;Both;15 reps;Supine Heel Slides: AAROM;Left;15 reps;Supine Straight Leg Raises: AAROM;Left;15 reps;Supine    General Comments        Pertinent Vitals/Pain Pain Assessment: 0-10 Pain Score: 5  Pain Location: R knee Pain Descriptors / Indicators: Aching;Sore Pain Intervention(s): Limited activity within patient's tolerance;Monitored during session;Premedicated before session;Ice applied    Home Living                      Prior Function            PT Goals (current goals can now be found in the care plan section) Acute Rehab PT Goals Patient Stated Goal: Regain IND PT Goal Formulation: With patient Time For Goal Achievement: 08/12/20 Potential  to Achieve Goals: Good Progress towards PT goals: Progressing toward goals    Frequency    7X/week      PT Plan Current plan remains appropriate    Co-evaluation              AM-PAC PT "6 Clicks" Mobility   Outcome Measure  Help needed turning from your back to your side while in a flat bed without using bedrails?: A Little Help needed moving from lying on your back to sitting on the side of a flat bed without using bedrails?: A Little Help needed moving to and  from a bed to a chair (including a wheelchair)?: A Little Help needed standing up from a chair using your arms (e.g., wheelchair or bedside chair)?: A Little Help needed to walk in hospital room?: A Little Help needed climbing 3-5 steps with a railing? : A Little 6 Click Score: 18    End of Session Equipment Utilized During Treatment: Gait belt;Left knee immobilizer Activity Tolerance: Patient tolerated treatment well Patient left: in chair;with call bell/phone within reach;with chair alarm set Nurse Communication: Mobility status PT Visit Diagnosis: Difficulty in walking, not elsewhere classified (R26.2);Unsteadiness on feet (R26.81)     Time: 0930-1010 PT Time Calculation (min) (ACUTE ONLY): 40 min  Charges:  $Gait Training: 8-22 mins $Therapeutic Exercise: 8-22 mins $Therapeutic Activity: 8-22 mins                     Debe Coder PT Acute Rehabilitation Services Pager [phone] Office [phone]    Kyshon Tolliver 07/30/2020, 12:30 PM

## 2020-07-30 NOTE — Plan of Care (Signed)
Continuing to help patient decrease pain level by encouraging rest and relaxation in addition to the pain medication. Deanna Artis, RN

## 2020-07-30 NOTE — Progress Notes (Signed)
Patient verbalized understanding of instructions and medications. All dc paperwork and patient belongings placed in bag to go with patient. Awaiting son to pick up. Deanna Artis RN

## 2020-07-30 NOTE — Plan of Care (Signed)
All care plans met, patient to discharge. Deanna Artis RN

## 2020-07-30 NOTE — Progress Notes (Signed)
Subjective: Interval History: none..   Objective: Vital signs in last 24 hours: Temp:  [98.1 F (36.7 C)-99.9 F (37.7 C)] 98.9 F (37.2 C) (08/22 0621) Pulse Rate:  [88-97] 95 (08/22 0621) Resp:  [16-20] 18 (08/22 0621) BP: (142-169)/(80-91) 155/85 (08/22 0621) SpO2:  [91 %-100 %] 100 % (08/22 0621)  Intake/Output from previous day: 08/21 0701 - 08/22 0700 In: 2145.1 [P.O.:1140; I.V.:1005.1] Out: 1800 [Urine:1800] Intake/Output this shift: No intake/output data recorded.  Incision/Wound: clean dry intact.No DVT  Lab Results: Recent Labs    07/29/20 0253 07/30/20 0247  WBC 10.7* 12.9*  HGB 11.0* 9.9*  HCT 35.8* 31.3*  PLT 277 258   BMET Recent Labs    07/29/20 0253  NA 136  K 3.5  CL 99  CO2 23  GLUCOSE 189*  BUN 11  CREATININE 0.74  CALCIUM 8.8*    Studies/Results: No results found. Anti-infectives: Anti-infectives (From admission, onward)   Start     Dose/Rate Route Frequency Ordered Stop   07/28/20 2000  ceFAZolin (ANCEF) IVPB 2g/100 mL premix        2 g 200 mL/hr over 30 Minutes Intravenous Every 6 hours 07/28/20 1752 07/29/20 0228   07/28/20 1756  valACYclovir (VALTREX) tablet 1,000 mg        1,000 mg Oral 2 times daily PRN 07/28/20 1752     07/28/20 1115  ceFAZolin (ANCEF) IVPB 2g/100 mL premix        2 g 200 mL/hr over 30 Minutes Intravenous On call to O.R. 07/28/20 1112 07/28/20 1359      Assessment/Plan: s/p Procedure(s): TOTAL KNEE ARTHROPLASTY (Left)  Doing well. D/c to home  F/u 2 weeks.    LOS: 2 days   Johnn Hai 07/30/2020, 8:30 AM

## 2020-08-01 ENCOUNTER — Encounter (HOSPITAL_COMMUNITY): Payer: Self-pay | Admitting: Orthopedic Surgery

## 2020-08-01 DIAGNOSIS — M25562 Pain in left knee: Secondary | ICD-10-CM | POA: Diagnosis not present

## 2020-08-03 DIAGNOSIS — M25562 Pain in left knee: Secondary | ICD-10-CM | POA: Diagnosis not present

## 2020-08-07 DIAGNOSIS — M25562 Pain in left knee: Secondary | ICD-10-CM | POA: Diagnosis not present

## 2020-08-08 DIAGNOSIS — Z4789 Encounter for other orthopedic aftercare: Secondary | ICD-10-CM | POA: Diagnosis not present

## 2020-08-09 DIAGNOSIS — M25562 Pain in left knee: Secondary | ICD-10-CM | POA: Diagnosis not present

## 2020-08-11 DIAGNOSIS — M25562 Pain in left knee: Secondary | ICD-10-CM | POA: Diagnosis not present

## 2020-08-15 NOTE — Discharge Summary (Signed)
Physician Discharge Summary   Patient ID: Yvonne Barron MRN: [id number] DOB/AGE: 08-30-1946 74 y.o.  Admit date: 07/28/2020 Discharge date: 07/30/2020  Primary Diagnosis: left knee primary osteoarthritis  Admission Diagnoses:  Past Medical History:  Diagnosis Date   Arthritis    knee Lt   History of breast cancer 2009--  INVASIVE DUCTAL IN SITU--  S/P MASTECTOMY   NO RECURRENCE   Hypertension    Pre-diabetes    Urethral caruncle    Discharge Diagnoses:   Active Problems:   H/O total knee replacement, left  Estimated body mass index is 24.9 kg/m as calculated from the following:   Height as of this encounter: $RemoveBeforeD'5\' 4"'[id number]$  (1.626 m).   Weight as of this encounter: 65.8 kg.  Procedure:  Procedure(s) (LRB): TOTAL KNEE ARTHROPLASTY (Left)   Consults: None  HPI: see H&P Laboratory Data: Admission on 07/28/2020, Discharged on 07/30/2020  Component Date Value Ref Range Status   WBC 07/29/2020 10.7* 4.0 - 10.5 K/uL Final   RBC 07/29/2020 3.99  3.87 - 5.11 MIL/uL Final   Hemoglobin 07/29/2020 11.0* 12.0 - 15.0 g/dL Final   HCT 07/29/2020 35.8* 36 - 46 % Final   MCV 07/29/2020 89.7  80.0 - 100.0 fL Final   MCH 07/29/2020 27.6  26.0 - 34.0 pg Final   MCHC 07/29/2020 30.7  30.0 - 36.0 g/dL Final   RDW 07/29/2020 14.2  11.5 - 15.5 % Final   Platelets 07/29/2020 277  150 - 400 K/uL Final   nRBC 07/29/2020 0.0  0.0 - 0.2 % Final   Performed at [facility], [place] [address] [place], [place] [address]   Sodium 07/29/2020 136  135 - 145 mmol/L Final   Potassium 07/29/2020 3.5  3.5 - 5.1 mmol/L Final   Chloride 07/29/2020 99  98 - 111 mmol/L Final   CO2 07/29/2020 23  22 - 32 mmol/L Final   Glucose, Bld 07/29/2020 189* 70 - 99 mg/dL Final   Glucose reference range applies only to samples taken after fasting for at least 8 hours.   BUN 07/29/2020 11  8 - 23 mg/dL Final   Creatinine, Ser 07/29/2020 0.74  0.44 - 1.00 mg/dL Final   Calcium  07/29/2020 8.8* 8.9 - 10.3 mg/dL Final   GFR calc non Af Amer 07/29/2020 >60  >60 mL/min Final   GFR calc Af Amer 07/29/2020 >60  >60 mL/min Final   Anion gap 07/29/2020 14  5 - 15 Final   Performed at [facility], [place] [address] [place], [place] [address]   Glucose-Capillary 07/28/2020 190* 70 - 99 mg/dL Final   Glucose reference range applies only to samples taken after fasting for at least 8 hours.   Glucose-Capillary 07/29/2020 107* 70 - 99 mg/dL Final   Glucose reference range applies only to samples taken after fasting for at least 8 hours.   Glucose-Capillary 07/29/2020 106* 70 - 99 mg/dL Final   Glucose reference range applies only to samples taken after fasting for at least 8 hours.   WBC 07/30/2020 12.9* 4.0 - 10.5 K/uL Final   RBC 07/30/2020 3.64* 3.87 - 5.11 MIL/uL Final   Hemoglobin 07/30/2020 9.9* 12.0 - 15.0 g/dL Final   HCT 07/30/2020 31.3* 36 - 46 % Final   MCV 07/30/2020 86.0  80.0 - 100.0 fL Final   MCH 07/30/2020 27.2  26.0 - 34.0 pg Final   MCHC 07/30/2020 31.6  30.0 - 36.0 g/dL Final   RDW 07/30/2020 14.2  11.5 -  15.5 % Final   Platelets 07/30/2020 258  150 - 400 K/uL Final   nRBC 07/30/2020 0.0  0.0 - 0.2 % Final   Performed at [facility], [address] [place], [place] [address]   Glucose-Capillary 07/29/2020 112* 70 - 99 mg/dL Final   Glucose reference range applies only to samples taken after fasting for at least 8 hours.   Glucose-Capillary 07/29/2020 144* 70 - 99 mg/dL Final   Glucose reference range applies only to samples taken after fasting for at least 8 hours.   Glucose-Capillary 07/30/2020 139* 70 - 99 mg/dL Final   Glucose reference range applies only to samples taken after fasting for at least 8 hours.  Hospital Outpatient Visit on 07/26/2020  Component Date Value Ref Range Status   SARS Coronavirus 2 07/26/2020 NEGATIVE  NEGATIVE Final   Comment: (NOTE) SARS-CoV-2 target nucleic acids  are NOT DETECTED.  The SARS-CoV-2 RNA is generally detectable in upper and lower respiratory specimens during the acute phase of infection. Negative results do not preclude SARS-CoV-2 infection, do not rule out co-infections with other pathogens, and should not be used as the sole basis for treatment or other patient management decisions. Negative results must be combined with clinical observations, patient history, and epidemiological information. The expected result is Negative.  Fact Sheet for Patients: HairSlick.no  Fact Sheet for Healthcare Providers: quierodirigir.com  This test is not yet approved or cleared by the [place] FDA and  has been authorized for detection and/or diagnosis of SARS-CoV-2 by FDA under an Emergency Use Authorization (EUA). This EUA will remain  in effect (meaning this test can be used) for the duration of the COVID-19 declaration under Se                          ction 564(b)(1) of the Act, 21 U.S.C. section 360bbb-3(b)(1), unless the authorization is terminated or revoked sooner.  Performed at [facility], [address], [place], [place] [address]   Hospital Outpatient Visit on 07/18/2020  Component Date Value Ref Range Status   Sodium 07/18/2020 142  135 - 145 mmol/L Final   Potassium 07/18/2020 4.3  3.5 - 5.1 mmol/L Final   Chloride 07/18/2020 105  98 - 111 mmol/L Final   CO2 07/18/2020 29  22 - 32 mmol/L Final   Glucose, Bld 07/18/2020 91  70 - 99 mg/dL Final   Glucose reference range applies only to samples taken after fasting for at least 8 hours.   BUN 07/18/2020 15  8 - 23 mg/dL Final   Creatinine, Ser 07/18/2020 0.77  0.44 - 1.00 mg/dL Final   Calcium 50/38/3870 9.2  8.9 - 10.3 mg/dL Final   GFR calc non Af Amer 07/18/2020 >60  >60 mL/min Final   GFR calc Af Amer 07/18/2020 >60  >60 mL/min Final   Anion gap 07/18/2020 8  5 - 15 Final   Performed at [facility], [address], [place], [place] [address]   WBC 07/18/2020 6.8  4.0 - 10.5 K/uL Final   RBC 07/18/2020 4.42  3.87 - 5.11 MIL/uL Final   Hemoglobin 07/18/2020 11.9* 12.0 - 15.0 g/dL Final   HCT 01/31/5464 38.9  36 - 46 % Final   MCV 07/18/2020 88.0  80.0 - 100.0 fL Final   MCH 07/18/2020 26.9  26.0 - 34.0 pg Final   MCHC 07/18/2020 30.6  30.0 - 36.0 g/dL Final   RDW  07/18/2020 14.4  11.5 - 15.5 % Final   Platelets 07/18/2020 304  150 - 400 K/uL Final   nRBC 07/18/2020 0.0  0.0 - 0.2 % Final   Performed at [facility], [place] [address], [place], [place] [address]   MRSA, PCR 07/18/2020 NEGATIVE  NEGATIVE Final   Staphylococcus aureus 07/18/2020 NEGATIVE  NEGATIVE Final   Comment: (NOTE) The Xpert SA Assay (FDA approved for NASAL specimens in patients 10 years of age and older), is one component of a comprehensive surveillance program. It is not intended to diagnose infection nor to guide or monitor treatment. Performed at [facility], [place] [address] [place], [place] [address]   Office Visit on 06/15/2020  Component Date Value Ref Range Status   Hgb A1c MFr Bld 06/15/2020 6.0* 4.8 - 5.6 % Final   Comment:          Prediabetes: 5.7 - 6.4          Diabetes: >6.4          Glycemic control for adults with diabetes: <7.0    Est. average glucose Bld gHb Est-m* 06/15/2020 126  mg/dL Final   Glucose 06/15/2020 97  65 - 99 mg/dL Final   BUN 06/15/2020 16  8 - 27 mg/dL Final   Creatinine, Ser 06/15/2020 0.77  0.57 - 1.00 mg/dL Final   GFR calc non Af Amer 06/15/2020 77  >59 mL/min/1.73 Final   GFR calc Af Amer 06/15/2020 89  >59 mL/min/1.73 Final   Comment: **[facility] currently reports eGFR in compliance with the current**   recommendations of the [employer]. [facility] will   update reporting as new guidelines are published from the NKF-ASN   Task force.    BUN/Creatinine Ratio 06/15/2020 21   12 - 28 Final   Sodium 06/15/2020 142  134 - 144 mmol/L Final   Potassium 06/15/2020 4.0  3.5 - 5.2 mmol/L Final   Chloride 06/15/2020 102  96 - 106 mmol/L Final   CO2 06/15/2020 25  20 - 29 mmol/L Final   Calcium 06/15/2020 9.8  8.7 - 10.3 mg/dL Final   Vitamin B-12 06/15/2020 1,370* 232 - 1,245 pg/mL Final   TSH 06/15/2020 1.640  0.450 - 4.500 uIU/mL Final     X-Rays:No results found.  EKG: Orders placed or performed in visit on 03/16/20   EKG 12-Lead     Hospital Course: Yvonne Barron is a 74 y.o. who was admitted to [facility]. They were brought to the operating room on 07/28/2020 and underwent Procedure(s): TOTAL KNEE ARTHROPLASTY.  Patient tolerated the procedure well and was later transferred to the recovery room and then to the orthopaedic floor for postoperative care.  They were given PO and IV analgesics for pain control following their surgery.  They were given 24 hours of postoperative antibiotics of  Anti-infectives (From admission, onward)   Start     Dose/Rate Route Frequency Ordered Stop   07/28/20 2000  ceFAZolin (ANCEF) IVPB 2g/100 mL premix        2 g 200 mL/hr over 30 Minutes Intravenous Every 6 hours 07/28/20 1752 07/29/20 0228   07/28/20 1756  valACYclovir (VALTREX) tablet 1,000 mg  Status:  Discontinued        1,000 mg Oral 2 times daily PRN 07/28/20 1752 07/30/20 1742   07/28/20 1115  ceFAZolin (ANCEF) IVPB 2g/100 mL premix        2 g 200 mL/hr over 30 Minutes Intravenous On  call to O.R. 07/28/20 1112 07/28/20 1359     and started on DVT prophylaxis in the form of Aspirin, TED hose and SCDs.   PT and OT were ordered for total joint protocol.  Discharge planning consulted to help with postop disposition and equipment needs.  Patient had a good night on the evening of surgery.  They started to get up OOB with therapy on day one. Hemovac drain was pulled without difficulty.  Continued to work with therapy into day two.  Dressing was changed  on day two and the incision was healing well.  By day two, the patient had progressed with therapy and meeting their goals.  Incision was healing well.  Patient was seen in rounds and was ready to go home.   Diet: Regular diet Activity:WBAT Follow-up:in 10-14 days Disposition - Home Discharged Condition: good    Allergies as of 07/30/2020   No Known Allergies     Medication List    TAKE these medications   aspirin 81 MG chewable tablet Commonly known as: Aspirin Childrens Chew 1 tablet (81 mg total) by mouth in the morning and at bedtime.   calcium carbonate 1250 (500 Ca) MG tablet Commonly known as: OS-CAL - dosed in mg of elemental calcium Take 1 tablet by mouth daily with breakfast.   cholecalciferol 1000 units tablet Commonly known as: VITAMIN D Take 1,000 Units by mouth 2 (two) times daily.   DRY EYES OP Place 1 drop into both eyes daily as needed (Dry eyes). Thera tears   hydrochlorothiazide 12.5 MG tablet Commonly known as: HYDRODIURIL Take 1 tablet by mouth once daily   methocarbamol 500 MG tablet Commonly known as: Robaxin Take 1 tablet (500 mg total) by mouth every 8 (eight) hours as needed for muscle spasms.   ondansetron 4 MG tablet Commonly known as: Zofran Take 1 tablet (4 mg total) by mouth every 8 (eight) hours as needed for nausea, vomiting or refractory nausea / vomiting.   OneTouch Verio test strip Generic drug: glucose blood USE 1 STRIP TO CHECK GLUCOSE ONCE DAILY   oxyCODONE-acetaminophen 5-325 MG tablet Commonly known as: Percocet Take 1 tablet by mouth every 4 (four) hours as needed for severe pain.   Systane 0.4-0.3 % Soln Generic drug: Polyethyl Glycol-Propyl Glycol Place 1 drop into both eyes at bedtime.   valACYclovir 1000 MG tablet Commonly known as: VALTREX Take 1,000 mg by mouth daily as needed (Cold sore).   vitamin B-12 500 MCG tablet Commonly known as: CYANOCOBALAMIN Take 500 mcg by mouth daily.   vitamin C 500 MG  tablet Commonly known as: ASCORBIC ACID Take 500 mg by mouth daily.       Follow-up Information    Rosilyn Mings.. Go on 08/01/2020.   Why: You are scheduled for a physical therapy appointment on 08-01-20 at 7:45 am.  Contact information: [place] [address] [phone]        Netta Cedars, MD. Go on 08/08/2020.   Specialty: Orthopedic Surgery Why: You are scheduled for a post-operative appointment on 08-08-20 at 10:15 am.  Contact information: [address] [place] [address] [phone]        Netta Cedars, MD. Call in 2 weeks.   Specialty: Orthopedic Surgery Why: [phone] Contact information: [address] [place] [address] [phone]               Signed: Lacie Draft, PA-C Orthopaedic Surgery 08/15/2020, 9:58 AM

## 2020-08-17 DIAGNOSIS — M25562 Pain in left knee: Secondary | ICD-10-CM | POA: Diagnosis not present

## 2020-08-21 ENCOUNTER — Other Ambulatory Visit: Payer: Self-pay | Admitting: Nurse Practitioner

## 2020-08-21 DIAGNOSIS — R7309 Other abnormal glucose: Secondary | ICD-10-CM

## 2020-08-22 ENCOUNTER — Encounter: Payer: Self-pay | Admitting: Nurse Practitioner

## 2020-08-22 DIAGNOSIS — R69 Illness, unspecified: Secondary | ICD-10-CM | POA: Diagnosis not present

## 2020-08-22 DIAGNOSIS — M25562 Pain in left knee: Secondary | ICD-10-CM | POA: Diagnosis not present

## 2020-08-24 ENCOUNTER — Telehealth: Payer: Self-pay

## 2020-08-24 DIAGNOSIS — M25562 Pain in left knee: Secondary | ICD-10-CM | POA: Diagnosis not present

## 2020-08-24 NOTE — Telephone Encounter (Signed)
Have her to ask the orthopedic is it okay to have her bone density?

## 2020-08-24 NOTE — Telephone Encounter (Signed)
Transition Care Management Follow-up Telephone Call  Date of discharge and from where: [place] 07/30/20  How have you been since you were released from the hospital? She has been going well  Any questions or concerns? Is she ok to do her bone density on the 27th?  Items Reviewed:  Did the pt receive and understand the discharge instructions provided? yes  Medications obtained and verified? yes  Any new allergies since your discharge? no  Dietary orders reviewed? np  Do you have support at home? yes  Other (ie: DME, Home Health, etc) she goes to physical therapy two times a week  Functional Questionnaire: (I = Independent and D = Dependent)  Bathing/Dressing- I   Meal Prep- I  Eating- I  Maintaining continence- I  Transferring/Ambulation- I  Managing Meds- I   Follow up appointments reviewed:    PCP Hospital f/u appt confirmed? [facility] f/u appt confirmed? No  Are transportation arrangements needed? NO  If their condition worsens, is the pt aware to call  their PCP or go to the ED? yes  Was the patient provided with contact information for the PCP's office or ED?yes  Was the pt encouraged to call back with questions or concerns? yes

## 2020-08-25 NOTE — Telephone Encounter (Signed)
Pt notified YL,RMA

## 2020-08-29 DIAGNOSIS — M25562 Pain in left knee: Secondary | ICD-10-CM | POA: Diagnosis not present

## 2020-08-31 DIAGNOSIS — M25562 Pain in left knee: Secondary | ICD-10-CM | POA: Diagnosis not present

## 2020-09-04 ENCOUNTER — Other Ambulatory Visit: Payer: Medicare HMO

## 2020-09-05 DIAGNOSIS — M25562 Pain in left knee: Secondary | ICD-10-CM | POA: Diagnosis not present

## 2020-09-07 DIAGNOSIS — M25562 Pain in left knee: Secondary | ICD-10-CM | POA: Diagnosis not present

## 2020-09-19 ENCOUNTER — Other Ambulatory Visit: Payer: Self-pay

## 2020-09-19 ENCOUNTER — Encounter: Payer: Self-pay | Admitting: Nurse Practitioner

## 2020-09-19 ENCOUNTER — Ambulatory Visit (INDEPENDENT_AMBULATORY_CARE_PROVIDER_SITE_OTHER): Payer: Medicare HMO | Admitting: Nurse Practitioner

## 2020-09-19 VITALS — BP 138/80 | HR 82 | Temp 98.4°F | Ht 63.0 in | Wt 135.4 lb

## 2020-09-19 DIAGNOSIS — I10 Essential (primary) hypertension: Secondary | ICD-10-CM

## 2020-09-19 DIAGNOSIS — R3129 Other microscopic hematuria: Secondary | ICD-10-CM | POA: Diagnosis not present

## 2020-09-19 DIAGNOSIS — Z23 Encounter for immunization: Secondary | ICD-10-CM | POA: Diagnosis not present

## 2020-09-19 DIAGNOSIS — R3911 Hesitancy of micturition: Secondary | ICD-10-CM

## 2020-09-19 DIAGNOSIS — E119 Type 2 diabetes mellitus without complications: Secondary | ICD-10-CM | POA: Diagnosis not present

## 2020-09-19 LAB — POCT URINALYSIS DIPSTICK
Bilirubin, UA: NEGATIVE
Glucose, UA: NEGATIVE
Ketones, UA: NEGATIVE
Nitrite, UA: NEGATIVE
Protein, UA: NEGATIVE
Spec Grav, UA: 1.02 (ref 1.010–1.025)
Urobilinogen, UA: 0.2 E.U./dL
pH, UA: 6 (ref 5.0–8.0)

## 2020-09-19 MED ORDER — NITROFURANTOIN MONOHYD MACRO 100 MG PO CAPS: 100.0000 mg | ORAL_CAPSULE | Freq: Two times a day (BID) | ORAL | 0 refills | Status: AC

## 2020-09-19 NOTE — Progress Notes (Signed)
I,Yamilka Roman Lebron,acting as a scribe for Janece Moore, FNP.,have documented all relevant documentation on the behalf of Janece Moore, FNP,as directed by  Janece Moore, FNP while in the presence of Janece Moore, FNP. This visit occurred during the SARS-CoV-2 public health emergency.  Safety protocols were in place, including screening questions prior to the visit, additional usage of staff PPE, and extensive cleaning of exam room while observing appropriate contact time as indicated for disinfecting solutions.  Subjective:     Patient ID: Yvonne Barron , female    DOB: 08/24/1946 , 74 y.o.   MRN: [id number]   Chief Complaint  Patient presents with  . Prediabetes  . Hypertension    HPI  Patient here for a blood pressure and diabetes f/u.  Blood sugar increased during her hospital stay for her knee surgery in August  102-118 blood sugar range.  She continues to do physical therapy. She is also going to her trainer couple times a week.    Wt Readings from Last 3 Encounters: 09/19/20 : 135 lb 6.4 oz (61.4 kg) 07/28/20 : 145 lb 1 oz (65.8 kg) 07/18/20 : 145 lb 2 oz (65.8 kg)   Diabetes She presents for her follow-up diabetic visit. Diabetes type: prediabetes. Her disease course has been stable. There are no hypoglycemic associated symptoms. There are no diabetic associated symptoms. Pertinent negatives for diabetes include no chest pain. There are no hypoglycemic complications. Symptoms are stable. There are no diabetic complications. Current diabetic treatment includes oral agent (monotherapy). She is compliant with treatment all of the time. Her weight is stable. She is following a generally healthy diet. When asked about meal planning, she reported none. She has not had a previous visit with a dietitian. Exercise: two days a week - 45 minutes a day. There is no change in her home blood glucose trend. (Blood sugars have been averaging 102-118) An ACE inhibitor/angiotensin II  receptor blocker is being taken. She does not see a podiatrist.Eye exam is not current (Appt on July 19th).     Past Medical History:  Diagnosis Date  . Arthritis    knee Lt  . History of breast cancer 2009--  INVASIVE DUCTAL IN SITU--  S/P MASTECTOMY   NO RECURRENCE  . Hypertension   . Pre-diabetes   . Urethral caruncle      Family History  Problem Relation Age of Onset  . Diabetes Mother   . Diabetes Father   . Kidney disease Father   . Diabetes Brother      Current Outpatient Medications:  .  Artificial Tear Ointment (DRY EYES OP), Place 1 drop into both eyes daily as needed (Dry eyes). Thera tears, Disp: , Rfl:  .  calcium carbonate (OS-CAL - DOSED IN MG OF ELEMENTAL CALCIUM) 1250 (500 Ca) MG tablet, Take 1 tablet by mouth daily with breakfast. , Disp: , Rfl:  .  cholecalciferol (VITAMIN D) 1000 units tablet, Take 1,000 Units by mouth 2 (two) times daily., Disp: , Rfl:  .  hydrochlorothiazide (HYDRODIURIL) 12.5 MG tablet, Take 1 tablet by mouth once daily, Disp: 90 tablet, Rfl: 0 .  ondansetron (ZOFRAN) 4 MG tablet, Take 1 tablet (4 mg total) by mouth every 8 (eight) hours as needed for nausea, vomiting or refractory nausea / vomiting., Disp: 30 tablet, Rfl: 1 .  ONETOUCH VERIO test strip, USE 1 STRIP TO CHECK GLUCOSE ONCE DAILY, Disp: 100 each, Rfl: 0 .  oxyCODONE-acetaminophen (PERCOCET) 5-325 MG tablet, Take 1 tablet by mouth every   4 (four) hours as needed for severe pain., Disp: 30 tablet, Rfl: 0 .  Polyethyl Glycol-Propyl Glycol (SYSTANE) 0.4-0.3 % SOLN, Place 1 drop into both eyes at bedtime., Disp: , Rfl:  .  valACYclovir (VALTREX) 1000 MG tablet, Take 1,000 mg by mouth daily as needed (Cold sore). , Disp: , Rfl:  .  vitamin B-12 (CYANOCOBALAMIN) 500 MCG tablet, Take 500 mcg by mouth daily., Disp: , Rfl:  .  vitamin C (ASCORBIC ACID) 500 MG tablet, Take 500 mg by mouth daily., Disp: , Rfl:  .  methocarbamol (ROBAXIN) 500 MG tablet, Take 1 tablet (500 mg total) by mouth  every 8 (eight) hours as needed for muscle spasms., Disp: 40 tablet, Rfl: 1   No Known Allergies   Review of Systems  Constitutional: Negative.   Respiratory: Negative.  Negative for cough.        Mucous production since having her surgery  Cardiovascular: Negative.  Negative for chest pain, palpitations and leg swelling.  Gastrointestinal: Negative.   Genitourinary: Negative.   Musculoskeletal: Negative.   Skin: Negative.   Psychiatric/Behavioral: Negative.      Today's Vitals   09/19/20 1051  BP: 138/80  Pulse: 82  Temp: 98.4 F (36.9 C)  TempSrc: Oral  Weight: 135 lb 6.4 oz (61.4 kg)  Height: 5' 3" (1.6 m)  PainSc: 5    Body mass index is 23.99 kg/m.   Objective:  Physical Exam Vitals reviewed.  Constitutional:      General: She is not in acute distress.    Appearance: Normal appearance.  Cardiovascular:     Pulses: Normal pulses.     Heart sounds: Normal heart sounds. No murmur heard.   Pulmonary:     Effort: Pulmonary effort is normal. No respiratory distress.     Breath sounds: Normal breath sounds.  Skin:    Capillary Refill: Capillary refill takes less than 2 seconds.  Neurological:     General: No focal deficit present.     Mental Status: She is alert and oriented to person, place, and time.     Cranial Nerves: No cranial nerve deficit.  Psychiatric:        Mood and Affect: Mood normal.        Behavior: Behavior normal.        Thought Content: Thought content normal.        Judgment: Judgment normal.        Assessment And Plan:     1. Essential hypertension  Chronic, good control  Continue with current medications - BMP8+eGFR  2. Type 2 diabetes mellitus without complication, without long-term current use of insulin (HCC)  Chronic, stable  Continue with diet and exercise  She has had a 10 lb weight loss since her surgery  - BMP8+eGFR - Hemoglobin A1c  3. Urinary hesitancy  Will check urinalysis, she does have moderate blood in  her urine  Will send urinalysis for culture  Will treat empirically for urinary tract infection will make changes pending culture  4. Need for influenza vaccination  Influenza vaccine administered  Encouraged to take Tylenol as needed for fever or muscle aches. - Flu Vaccine QUAD High Dose(Fluad)   5. Other microscopic hematuria  - Culture, Urine    Patient was given opportunity to ask questions. Patient verbalized understanding of the plan and was able to repeat key elements of the plan. All questions were answered to their satisfaction.   Teola Bradley, FNP, have reviewed all documentation for this visit.  The documentation on 09/19/20 for the exam, diagnosis, procedures, and orders are all accurate and complete.   THE PATIENT IS ENCOURAGED TO PRACTICE SOCIAL DISTANCING DUE TO THE COVID-19 PANDEMIC.

## 2020-09-20 ENCOUNTER — Other Ambulatory Visit: Payer: Self-pay | Admitting: Nurse Practitioner

## 2020-09-20 LAB — BMP8+EGFR
BUN/Creatinine Ratio: 22 (ref 12–28)
BUN: 25 mg/dL (ref 8–27)
CO2: 24 mmol/L (ref 20–29)
Calcium: 10 mg/dL (ref 8.7–10.3)
Chloride: 102 mmol/L (ref 96–106)
Creatinine, Ser: 1.14 mg/dL — ABNORMAL HIGH (ref 0.57–1.00)
GFR calc Af Amer: 56 mL/min/{1.73_m2} — ABNORMAL LOW (ref >59)
GFR calc non Af Amer: 48 mL/min/{1.73_m2} — ABNORMAL LOW (ref >59)
Glucose: 106 mg/dL — ABNORMAL HIGH (ref 65–99)
Potassium: 3.5 mmol/L (ref 3.5–5.2)
Sodium: 141 mmol/L (ref 134–144)

## 2020-09-20 LAB — HEMOGLOBIN A1C
Est. average glucose Bld gHb Est-mCnc: 117 mg/dL
Hgb A1c MFr Bld: 5.7 % — ABNORMAL HIGH (ref 4.8–5.6)

## 2020-09-21 DIAGNOSIS — M25562 Pain in left knee: Secondary | ICD-10-CM | POA: Diagnosis not present

## 2020-09-21 LAB — URINE CULTURE

## 2020-09-25 ENCOUNTER — Encounter: Payer: Self-pay | Admitting: Nurse Practitioner

## 2020-09-28 DIAGNOSIS — M25562 Pain in left knee: Secondary | ICD-10-CM | POA: Diagnosis not present

## 2020-10-02 ENCOUNTER — Encounter: Payer: Self-pay | Admitting: Nurse Practitioner

## 2020-10-03 ENCOUNTER — Other Ambulatory Visit: Payer: Self-pay

## 2020-10-03 ENCOUNTER — Other Ambulatory Visit: Payer: Medicare HMO

## 2020-10-03 DIAGNOSIS — M25562 Pain in left knee: Secondary | ICD-10-CM | POA: Diagnosis not present

## 2020-10-03 DIAGNOSIS — N289 Disorder of kidney and ureter, unspecified: Secondary | ICD-10-CM | POA: Diagnosis not present

## 2020-10-04 ENCOUNTER — Encounter: Payer: Self-pay | Admitting: Nurse Practitioner

## 2020-10-04 LAB — BMP8+EGFR
BUN/Creatinine Ratio: 19 (ref 12–28)
BUN: 12 mg/dL (ref 8–27)
CO2: 25 mmol/L (ref 20–29)
Calcium: 9.5 mg/dL (ref 8.7–10.3)
Chloride: 102 mmol/L (ref 96–106)
Creatinine, Ser: 0.62 mg/dL (ref 0.57–1.00)
GFR calc Af Amer: 104 mL/min/{1.73_m2} (ref >59)
GFR calc non Af Amer: 90 mL/min/{1.73_m2} (ref >59)
Glucose: 95 mg/dL (ref 65–99)
Potassium: 3.9 mmol/L (ref 3.5–5.2)
Sodium: 139 mmol/L (ref 134–144)

## 2020-10-04 LAB — SPECIMEN STATUS REPORT

## 2020-10-10 DIAGNOSIS — M25562 Pain in left knee: Secondary | ICD-10-CM | POA: Diagnosis not present

## 2020-10-17 DIAGNOSIS — M25562 Pain in left knee: Secondary | ICD-10-CM | POA: Diagnosis not present

## 2020-10-19 DIAGNOSIS — M25562 Pain in left knee: Secondary | ICD-10-CM | POA: Diagnosis not present

## 2020-10-26 DIAGNOSIS — M25562 Pain in left knee: Secondary | ICD-10-CM | POA: Diagnosis not present

## 2020-10-31 DIAGNOSIS — M25562 Pain in left knee: Secondary | ICD-10-CM | POA: Diagnosis not present

## 2020-11-09 DIAGNOSIS — M25562 Pain in left knee: Secondary | ICD-10-CM | POA: Diagnosis not present

## 2020-11-16 DIAGNOSIS — M25562 Pain in left knee: Secondary | ICD-10-CM | POA: Diagnosis not present

## 2020-11-23 DIAGNOSIS — M25562 Pain in left knee: Secondary | ICD-10-CM | POA: Diagnosis not present

## 2020-12-04 ENCOUNTER — Other Ambulatory Visit: Payer: Self-pay | Admitting: Nurse Practitioner

## 2020-12-04 DIAGNOSIS — R7309 Other abnormal glucose: Secondary | ICD-10-CM

## 2020-12-07 DIAGNOSIS — M25562 Pain in left knee: Secondary | ICD-10-CM | POA: Diagnosis not present

## 2020-12-12 ENCOUNTER — Encounter: Payer: Self-pay | Admitting: Nurse Practitioner

## 2020-12-12 ENCOUNTER — Other Ambulatory Visit: Payer: Self-pay

## 2020-12-12 ENCOUNTER — Ambulatory Visit
Admission: RE | Admit: 2020-12-12 | Discharge: 2020-12-12 | Disposition: A | Discharge status: Home / Self Care | Payer: Medicare HMO | Source: Ambulatory Visit | Attending: Nurse Practitioner | Admitting: Nurse Practitioner

## 2020-12-12 DIAGNOSIS — E2839 Other primary ovarian failure: Secondary | ICD-10-CM

## 2020-12-12 DIAGNOSIS — Z780 Asymptomatic menopausal state: Secondary | ICD-10-CM | POA: Diagnosis not present

## 2020-12-14 DIAGNOSIS — M25562 Pain in left knee: Secondary | ICD-10-CM | POA: Diagnosis not present

## 2020-12-21 DIAGNOSIS — M25562 Pain in left knee: Secondary | ICD-10-CM | POA: Diagnosis not present

## 2020-12-28 DIAGNOSIS — M25562 Pain in left knee: Secondary | ICD-10-CM | POA: Diagnosis not present

## 2021-01-04 DIAGNOSIS — M25562 Pain in left knee: Secondary | ICD-10-CM | POA: Diagnosis not present

## 2021-01-22 ENCOUNTER — Encounter: Payer: Self-pay | Admitting: Nurse Practitioner

## 2021-01-22 ENCOUNTER — Other Ambulatory Visit: Payer: Self-pay

## 2021-01-22 ENCOUNTER — Ambulatory Visit (INDEPENDENT_AMBULATORY_CARE_PROVIDER_SITE_OTHER): Payer: Medicare HMO | Admitting: Nurse Practitioner

## 2021-01-22 VITALS — BP 136/82 | HR 68 | Temp 98.1°F | Ht 63.0 in | Wt 139.8 lb

## 2021-01-22 DIAGNOSIS — I10 Essential (primary) hypertension: Secondary | ICD-10-CM | POA: Diagnosis not present

## 2021-01-22 DIAGNOSIS — E119 Type 2 diabetes mellitus without complications: Secondary | ICD-10-CM | POA: Diagnosis not present

## 2021-01-22 DIAGNOSIS — E559 Vitamin D deficiency, unspecified: Secondary | ICD-10-CM | POA: Diagnosis not present

## 2021-01-22 NOTE — Patient Instructions (Signed)
Diabetes Mellitus Basics  Diabetes mellitus, or diabetes, is a long-term (chronic) disease. It occurs when the body does not properly use sugar (glucose) that is released from food after you eat. Diabetes mellitus may be caused by one or both of these problems:  Your pancreas does not make enough of a hormone called insulin.  Your body does not react in a normal way to the insulin that it makes. Insulin lets glucose enter cells in your body. This gives you energy. If you have diabetes, glucose cannot get into cells. This causes high blood glucose (hyperglycemia). How to treat and manage diabetes You may need to take insulin or other diabetes medicines daily to keep your glucose in balance. If you are prescribed insulin, you will learn how to give yourself insulin by injection. You may need to adjust the amount of insulin you take based on the foods that you eat. You will need to check your blood glucose levels using a glucose monitor as told by your health care provider. The readings can help determine if you have low or high blood glucose. Generally, you should have these blood glucose levels:  Before meals (preprandial): 80-130 mg/dL (4.4-7.2 mmol/L).  After meals (postprandial): below 180 mg/dL (10 mmol/L).  Hemoglobin A1c (HbA1c) level: less than 7%. Your health care provider will set treatment goals for you. Keep all follow-up visits. This is important. Follow these instructions at home: Diabetes medicines Take your diabetes medicines every day as told by your health care provider. List your diabetes medicines here:  Name of medicine: ______________________________ ? Amount (dose): _______________ Time (a.m./p.m.): _______________ Notes: ___________________________________  Name of medicine: ______________________________ ? Amount (dose): _______________ Time (a.m./p.m.): _______________ Notes: ___________________________________  Name of medicine:  ______________________________ ? Amount (dose): _______________ Time (a.m./p.m.): _______________ Notes: ___________________________________ Insulin If you use insulin, list the types of insulin you use here:  Insulin type: ______________________________ ? Amount (dose): _______________ Time (a.m./p.m.): _______________Notes: ___________________________________  Insulin type: ______________________________ ? Amount (dose): _______________ Time (a.m./p.m.): _______________ Notes: ___________________________________  Insulin type: ______________________________ ? Amount (dose): _______________ Time (a.m./p.m.): _______________ Notes: ___________________________________  Insulin type: ______________________________ ? Amount (dose): _______________ Time (a.m./p.m.): _______________ Notes: ___________________________________  Insulin type: ______________________________ ? Amount (dose): _______________ Time (a.m./p.m.): _______________ Notes: ___________________________________ Managing blood glucose Check your blood glucose levels using a glucose monitor as told by your health care provider. Write down the times that you check your glucose levels here:  Time: _______________ Notes: ___________________________________  Time: _______________ Notes: ___________________________________  Time: _______________ Notes: ___________________________________  Time: _______________ Notes: ___________________________________  Time: _______________ Notes: ___________________________________  Time: _______________ Notes: ___________________________________   Low blood glucose Low blood glucose (hypoglycemia) is when glucose is at or below 70 mg/dL (3.9 mmol/L). Symptoms may include:  Feeling: ? Hungry. ? Sweaty and clammy. ? Irritable or easily upset. ? Dizzy. ? Sleepy.  Having: ? A fast heartbeat. ? A headache. ? A change in your vision. ? Numbness around the mouth, lips, or  tongue.  Having trouble with: ? Moving (coordination). ? Sleeping. Treating low blood glucose To treat low blood glucose, eat or drink something containing sugar right away. If you can think clearly and swallow safely, follow the 15:15 rule:  Take 15 grams of a fast-acting carb (carbohydrate), as told by your health care provider.  Some fast-acting carbs are: ? Glucose tablets: take 3-4 tablets. ? Hard candy: eat 3-5 pieces. ? Fruit juice: drink 4 oz (120 mL). ? Regular (not diet) soda: drink 4-6 oz (120-180 mL). ? Honey or sugar:   eat 1 Tbsp (15 mL).  Check your blood glucose levels 15 minutes after you take the carb.  If your glucose is still at or below 70 mg/dL (3.9 mmol/L), take 15 grams of a carb again.  If your glucose does not go above 70 mg/dL (3.9 mmol/L) after 3 tries, get help right away.  After your glucose goes back to normal, eat a meal or a snack within 1 hour. Treating very low blood glucose If your glucose is at or below 54 mg/dL (3 mmol/L), you have very low blood glucose (severe hypoglycemia). This is an emergency. Do not wait to see if the symptoms will go away. Get medical help right away. Call your local emergency services (911 in the [place] Do not drive yourself to the hospital. Questions to ask your health care provider  Should I talk with a diabetes educator?  What equipment will I need to care for myself at home?  What diabetes medicines do I need? When should I take them?  How often do I need to check my blood glucose levels?  What number can I call if I have questions?  When is my follow-up visit?  Where can I find a support group for people with diabetes? Where to find more information  American Diabetes Association: www.diabetes.org  Association of Diabetes Care and Education Specialists: www.diabeteseducator.org Contact a health care provider if:  Your blood glucose is at or above 240 mg/dL (13.3 mmol/L) for 2 days in a row.  You have  been sick or have had a fever for 2 days or more, and you are not getting better.  You have any of these problems for more than 6 hours: ? You cannot eat or drink. ? You feel nauseous. ? You vomit. ? You have diarrhea. Get help right away if:  Your blood glucose is lower than 54 mg/dL (3 mmol/L).  You get confused.  You have trouble thinking clearly.  You have trouble breathing. These symptoms may represent a serious problem that is an emergency. Do not wait to see if the symptoms will go away. Get medical help right away. Call your local emergency services (911 in the [place] Do not drive yourself to the hospital. Summary  Diabetes mellitus is a chronic disease that occurs when the body does not properly use sugar (glucose) that is released from food after you eat.  Take insulin and diabetes medicines as told.  Check your blood glucose every day, as often as told.  Keep all follow-up visits. This is important. This information is not intended to replace advice given to you by your health care provider. Make sure you discuss any questions you have with your health care provider. Document Revised: 03/28/2020 Document Reviewed: 03/28/2020 Elsevier Patient Education  2021 Elsevier Inc.  

## 2021-01-22 NOTE — Progress Notes (Signed)
I,Yamilka Roman [employer] as a Education administrator for [employer], FNP.,have documented all relevant documentation on the behalf of Minette Brine, FNP,as directed by  Minette Brine, FNP while in the presence of Minette Brine, [place]. This visit occurred during the SARS-CoV-2 public health emergency.  Safety protocols were in place, including screening questions prior to the visit, additional usage of staff PPE, and extensive cleaning of exam room while observing appropriate contact time as indicated for disinfecting solutions.  Subjective:     Patient ID: Yvonne Barron , female    DOB: Jun 20, 1946 , 75 y.o.   MRN: [id number]   Chief Complaint  Patient presents with  . Diabetes  . Hypertension    HPI  Patient presents today for a diabetes f/u. Continues to exercise regularly. She is no longer using a cane from her left knee replacement.  She is working with her trainer 3 days a week.  She is going to see Dr. Cannon Kettle for follow up neuropathy  Wt Readings from Last 3 Encounters: 01/22/21 : 139 lb 12.8 oz (63.4 kg) 09/19/20 : 135 lb 6.4 oz (61.4 kg) 07/28/20 : 145 lb 1 oz (65.8 kg)   Diabetes She presents for her follow-up diabetic visit. Diabetes type: prediabetes. Her disease course has been stable. There are no hypoglycemic associated symptoms. There are no diabetic associated symptoms. Pertinent negatives for diabetes include no chest pain and no fatigue. There are no hypoglycemic complications. Symptoms are stable. There are no diabetic complications. Risk factors for coronary artery disease include post-menopausal. Current diabetic treatment includes oral agent (monotherapy). She is compliant with treatment all of the time. Her weight is stable. She is following a generally healthy diet. When asked about meal planning, she reported none. She has not had a previous visit with a dietitian. Exercise: two days a week - 45 minutes a day. There is no change in her home blood glucose trend. ( Blood  sugar in morning 95-104.  Rarely her blood sugar is over 108.  ) An ACE inhibitor/angiotensin II receptor blocker is being taken. She does not see a podiatrist.Eye exam is not current (Appt on July 19th).     Past Medical History:  Diagnosis Date  . Arthritis    knee Lt  . History of breast cancer 2009--  INVASIVE DUCTAL IN SITU--  S/P MASTECTOMY   NO RECURRENCE  . Hypertension   . Pre-diabetes   . Urethral caruncle      Family History  Problem Relation Age of Onset  . Diabetes Mother   . Diabetes Father   . Kidney disease Father   . Diabetes Brother      Current Outpatient Medications:  .  Artificial Tear Ointment (DRY EYES OP), Place 1 drop into both eyes daily as needed (Dry eyes). Thera tears, Disp: , Rfl:  .  calcium carbonate (OS-CAL - DOSED IN MG OF ELEMENTAL CALCIUM) 1250 (500 Ca) MG tablet, Take 1 tablet by mouth daily with breakfast. , Disp: , Rfl:  .  cholecalciferol (VITAMIN D) 1000 units tablet, Take 1,000 Units by mouth 2 (two) times daily., Disp: , Rfl:  .  hydrochlorothiazide (HYDRODIURIL) 12.5 MG tablet, Take 1 tablet by mouth once daily, Disp: 90 tablet, Rfl: 0 .  ondansetron (ZOFRAN) 4 MG tablet, Take 1 tablet (4 mg total) by mouth every 8 (eight) hours as needed for nausea, vomiting or refractory nausea / vomiting., Disp: 30 tablet, Rfl: 1 .  oxyCODONE-acetaminophen (PERCOCET) 5-325 MG tablet, Take 1 tablet by mouth every  4 (four) hours as needed for severe pain., Disp: 30 tablet, Rfl: 0 .  Polyethyl Glycol-Propyl Glycol (SYSTANE) 0.4-0.3 % SOLN, Place 1 drop into both eyes at bedtime., Disp: , Rfl:  .  valACYclovir (VALTREX) 1000 MG tablet, Take 1,000 mg by mouth daily as needed (Cold sore). , Disp: , Rfl:  .  vitamin B-12 (CYANOCOBALAMIN) 500 MCG tablet, Take 500 mcg by mouth daily., Disp: , Rfl:  .  vitamin C (ASCORBIC ACID) 500 MG tablet, Take 500 mg by mouth daily., Disp: , Rfl:  .  HYDROcodone-acetaminophen (NORCO/VICODIN) 5-325 MG tablet, hydrocodone 5  mg-acetaminophen 325 mg tablet, Disp: , Rfl:  .  Lancets (ONETOUCH DELICA PLUS [id number]) MISC, USE TO TEST BLOOD SUGAR BEFORE BREAKFAST AND DINNER, Disp: 100 each, Rfl: 0 .  methocarbamol (ROBAXIN) 500 MG tablet, methocarbamol 500 mg tablet  TAKE 1 TABLET BY MOUTH EVERY 6 HOURS AS NEEDED FOR SPASMS, Disp: , Rfl:  .  ONETOUCH VERIO test strip, USE 1 STRIP TO CHECK GLUCOSE ONCE DAILY, Disp: 100 each, Rfl: 0   No Known Allergies   Review of Systems  Constitutional: Negative.  Negative for fatigue.  Respiratory: Negative.   Cardiovascular: Negative for chest pain, palpitations and leg swelling.  Psychiatric/Behavioral: Negative.      Today's Vitals   01/22/21 0954  BP: 136/82  Pulse: 68  Temp: 98.1 F (36.7 C)  TempSrc: Oral  Weight: 139 lb 12.8 oz (63.4 kg)  Height: 5' 3"  (1.6 m)  PainSc: 0-No pain   Body mass index is 24.76 kg/m.   Objective:  Physical Exam Constitutional:      General: She is not in acute distress.    Appearance: Normal appearance. She is normal weight.  Cardiovascular:     Rate and Rhythm: Normal rate and regular rhythm.     Pulses: Normal pulses.     Heart sounds: Normal heart sounds.  Pulmonary:     Effort: Pulmonary effort is normal. No respiratory distress.     Breath sounds: Normal breath sounds.  Musculoskeletal:        General: Normal range of motion.     Cervical back: Normal range of motion and neck supple.  Skin:    General: Skin is warm and dry.     Capillary Refill: Capillary refill takes less than 2 seconds.  Neurological:     General: No focal deficit present.     Mental Status: She is alert and oriented to person, place, and time.  Psychiatric:        Mood and Affect: Mood normal.        Behavior: Behavior normal.        Thought Content: Thought content normal.        Judgment: Judgment normal.         Assessment And Plan:     1. Type 2 diabetes mellitus without complication, without long-term current use of insulin  (HCC)  Chronic, she is mostly controlled  No current medications, diet controlled  Continue with regular exercise as tolerated - Lipid panel - CMP14+EGFR - Hemoglobin A1c  2. Essential hypertension  Chronic, fair control  Continue with current medications - CMP14+EGFR  3. Vitamin D deficiency  Will check vitamin D level and supplement as needed.     Also encouraged to spend 15 minutes in the sun daily.  - VITAMIN D 25 Hydroxy (Vit-D Deficiency, Fractures)     Patient was given opportunity to ask questions. Patient verbalized understanding of the plan and  was able to repeat key elements of the plan. All questions were answered to their satisfaction.  Minette Brine, FNP   I, Minette Brine, FNP, have reviewed all documentation for this visit. The documentation on 02/21/21 for the exam, diagnosis, procedures, and orders are all accurate and complete.  THE PATIENT IS ENCOURAGED TO PRACTICE SOCIAL DISTANCING DUE TO THE COVID-19 PANDEMIC.

## 2021-01-23 LAB — CMP14+EGFR
ALT: 9 IU/L (ref 0–32)
AST: 12 IU/L (ref 0–40)
Albumin/Globulin Ratio: 1.6 (ref 1.2–2.2)
Albumin: 4.6 g/dL (ref 3.7–4.7)
Alkaline Phosphatase: 106 IU/L (ref 44–121)
BUN/Creatinine Ratio: 18 (ref 12–28)
BUN: 13 mg/dL (ref 8–27)
Bilirubin Total: 0.2 mg/dL (ref 0.0–1.2)
CO2: 23 mmol/L (ref 20–29)
Calcium: 9.7 mg/dL (ref 8.7–10.3)
Chloride: 103 mmol/L (ref 96–106)
Creatinine, Ser: 0.72 mg/dL (ref 0.57–1.00)
GFR calc Af Amer: 97 mL/min/{1.73_m2} (ref >59)
GFR calc non Af Amer: 84 mL/min/{1.73_m2} (ref >59)
Globulin, Total: 2.8 g/dL (ref 1.5–4.5)
Glucose: 98 mg/dL (ref 65–99)
Potassium: 4.5 mmol/L (ref 3.5–5.2)
Sodium: 140 mmol/L (ref 134–144)
Total Protein: 7.4 g/dL (ref 6.0–8.5)

## 2021-01-23 LAB — LIPID PANEL
Chol/HDL Ratio: 2.3 ratio (ref 0.0–4.4)
Cholesterol, Total: 202 mg/dL — ABNORMAL HIGH (ref 100–199)
HDL: 89 mg/dL (ref >39)
LDL Chol Calc (NIH): 105 mg/dL — ABNORMAL HIGH (ref 0–99)
Triglycerides: 44 mg/dL (ref 0–149)
VLDL Cholesterol Cal: 8 mg/dL (ref 5–40)

## 2021-01-23 LAB — VITAMIN D 25 HYDROXY (VIT D DEFICIENCY, FRACTURES): Vit D, 25-Hydroxy: 68.9 ng/mL (ref 30.0–100.0)

## 2021-01-23 LAB — HEMOGLOBIN A1C
Est. average glucose Bld gHb Est-mCnc: 117 mg/dL
Hgb A1c MFr Bld: 5.7 % — ABNORMAL HIGH (ref 4.8–5.6)

## 2021-01-25 ENCOUNTER — Ambulatory Visit: Payer: Medicare HMO | Admitting: Sports Medicine

## 2021-01-25 ENCOUNTER — Other Ambulatory Visit: Payer: Self-pay

## 2021-01-25 ENCOUNTER — Encounter: Payer: Self-pay | Admitting: Sports Medicine

## 2021-01-25 DIAGNOSIS — G629 Polyneuropathy, unspecified: Secondary | ICD-10-CM

## 2021-01-25 DIAGNOSIS — E119 Type 2 diabetes mellitus without complications: Secondary | ICD-10-CM

## 2021-01-25 DIAGNOSIS — C801 Malignant (primary) neoplasm, unspecified: Secondary | ICD-10-CM | POA: Diagnosis not present

## 2021-01-25 NOTE — Progress Notes (Signed)
Subjective: Yvonne Barron is a 75 y.o. female patient who returns office for follow-up evaluation of with sensations to both feet reports that at bedtime it is difficult for her to sleep due to awkward feelings in feet feels as if her skin is cracking and irritated and difficult to bend the toes due to awkward feeling to the balls of both feet.  Patient reports that she had knee surgery and has recovered very well still has some swelling on her left knee and has completed therapy.  No other issues noted.  Patient Active Problem List   Diagnosis Date Noted  . H/O total knee replacement, left 07/28/2020  . Type 2 diabetes mellitus without complication, without long-term current use of insulin ([place]) 04/15/2019  . History of arthroscopic procedure on shoulder 11/13/2018  . Pain in right knee 11/13/2018  . Abnormal glucose 09/14/2018  . Vitamin D deficiency 08/29/2018  . Cervicalgia 08/29/2018  . Decreased estrogen level 08/29/2018  . Urinary frequency 08/29/2018  . Cancer ([place]) 05/18/2018  . Urethral caruncle 07/10/2012  . Breast cancer, left breast ([place]) 08/06/2011  . PARESTHESIA 07/01/2008  . Breast cancer in situ 12/10/2007  . Essential hypertension 06/17/2007    Current Outpatient Medications on File Prior to Visit  Medication Sig Dispense Refill  . Artificial Tear Ointment (DRY EYES OP) Place 1 drop into both eyes daily as needed (Dry eyes). Thera tears    . calcium carbonate (OS-CAL - DOSED IN MG OF ELEMENTAL CALCIUM) 1250 (500 Ca) MG tablet Take 1 tablet by mouth daily with breakfast.     . cholecalciferol (VITAMIN D) 1000 units tablet Take 1,000 Units by mouth 2 (two) times daily.    . hydrochlorothiazide (HYDRODIURIL) 12.5 MG tablet Take 1 tablet by mouth once daily 90 tablet 0  . HYDROcodone-acetaminophen (NORCO/VICODIN) 5-325 MG tablet hydrocodone 5 mg-acetaminophen 325 mg tablet    . methocarbamol (ROBAXIN) 500 MG tablet methocarbamol 500 mg tablet  TAKE 1 TABLET BY  MOUTH EVERY 6 HOURS AS NEEDED FOR SPASMS    . ondansetron (ZOFRAN) 4 MG tablet Take 1 tablet (4 mg total) by mouth every 8 (eight) hours as needed for nausea, vomiting or refractory nausea / vomiting. 30 tablet 1  . ONETOUCH VERIO test strip USE 1 STRIP TO CHECK GLUCOSE ONCE DAILY 100 each 0  . oxyCODONE-acetaminophen (PERCOCET) 5-325 MG tablet Take 1 tablet by mouth every 4 (four) hours as needed for severe pain. 30 tablet 0  . Polyethyl Glycol-Propyl Glycol (SYSTANE) 0.4-0.3 % SOLN Place 1 drop into both eyes at bedtime.    . valACYclovir (VALTREX) 1000 MG tablet Take 1,000 mg by mouth daily as needed (Cold sore).     . vitamin B-12 (CYANOCOBALAMIN) 500 MCG tablet Take 500 mcg by mouth daily.    . vitamin C (ASCORBIC ACID) 500 MG tablet Take 500 mg by mouth daily.     No current facility-administered medications on file prior to visit.    No Known Allergies  Objective:  General: Alert and oriented x3 in no acute distress  Dermatology: Old scar well-healed at first metatarsophalangeal joint on right.  No open lesions bilateral lower extremities, no webspace macerations, no ecchymosis bilateral, all nails x 10 are well manicured.  Vascular: Dorsalis Pedis and Posterior Tibial pedal pulses palpable.  Neurology: Gross sensation intact via light touch bilateral, vibratory sensation severely diminished bilateral likely consistent with small fiber neuropathy with Thornell Mule monofilament protective sensation intact.  Subjective numbness to all toes bilateral and awkward  sensation at the ball of both feet bilateral worse at night.  Musculoskeletal: No tenderness with palpation bilateral.  There is severely limited first metatarsophalangeal joint range of motion right greater than left history of previous Cartevia the procedure unchanged from prior.  Assessment and Plan: Problem List Items Addressed This Visit      Endocrine   Type 2 diabetes mellitus without complication, without  long-term current use of insulin ([place])     Other   Cancer ([place])    Other Visit Diagnoses    Neuropathy    -  Primary       -Complete examination performed -Discussed treatment options again for her continued neuropathy -Recommend nervive supplement -Recommend to use topical pain cream or rub at bedtime -Continue with good supportive shoes daily for foot type -Patient to return to office as needed or sooner if condition worsens.  Landis Martins, DPM

## 2021-01-25 NOTE — Patient Instructions (Signed)
Nervive suppliment Topical pain cream at bedtime

## 2021-02-12 ENCOUNTER — Other Ambulatory Visit: Payer: Self-pay | Admitting: Nurse Practitioner

## 2021-02-12 DIAGNOSIS — R7309 Other abnormal glucose: Secondary | ICD-10-CM

## 2021-03-21 ENCOUNTER — Ambulatory Visit (INDEPENDENT_AMBULATORY_CARE_PROVIDER_SITE_OTHER): Payer: Medicare HMO | Admitting: Nurse Practitioner

## 2021-03-21 ENCOUNTER — Encounter: Payer: Self-pay | Admitting: Nurse Practitioner

## 2021-03-21 ENCOUNTER — Other Ambulatory Visit: Payer: Self-pay

## 2021-03-21 VITALS — BP 122/84 | HR 68 | Temp 98.0°F | Ht 62.6 in | Wt 142.0 lb

## 2021-03-21 DIAGNOSIS — Z2821 Immunization not carried out because of patient refusal: Secondary | ICD-10-CM | POA: Diagnosis not present

## 2021-03-21 DIAGNOSIS — M545 Low back pain, unspecified: Secondary | ICD-10-CM

## 2021-03-21 DIAGNOSIS — Z0000 Encounter for general adult medical examination without abnormal findings: Secondary | ICD-10-CM | POA: Diagnosis not present

## 2021-03-21 DIAGNOSIS — I10 Essential (primary) hypertension: Secondary | ICD-10-CM | POA: Diagnosis not present

## 2021-03-21 LAB — POCT URINALYSIS DIPSTICK
Bilirubin, UA: NEGATIVE
Glucose, UA: NEGATIVE
Ketones, UA: NEGATIVE
Leukocytes, UA: NEGATIVE
Nitrite, UA: NEGATIVE
Protein, UA: NEGATIVE
Spec Grav, UA: 1.01 (ref 1.010–1.025)
Urobilinogen, UA: 0.2 E.U./dL
pH, UA: 7 (ref 5.0–8.0)

## 2021-03-21 LAB — POCT UA - MICROALBUMIN
Albumin/Creatinine Ratio, Urine, POC: 30
Creatinine, POC: 50 mg/dL
Microalbumin Ur, POC: 10 mg/L

## 2021-03-21 NOTE — Patient Instructions (Addendum)
Health Maintenance, Female Adopting a healthy lifestyle and getting preventive care are important in promoting health and wellness. Ask your health care provider about:  The right schedule for you to have regular tests and exams.  Things you can do on your own to prevent diseases and keep yourself healthy. What should I know about diet, weight, and exercise? Eat a healthy diet  Eat a diet that includes plenty of vegetables, fruits, low-fat dairy products, and lean protein.  Do not eat a lot of foods that are high in solid fats, added sugars, or sodium.   Maintain a healthy weight Body mass index (BMI) is used to identify weight problems. It estimates body fat based on height and weight. Your health care provider can help determine your BMI and help you achieve or maintain a healthy weight. Get regular exercise Get regular exercise. This is one of the most important things you can do for your health. Most adults should:  Exercise for at least 150 minutes each week. The exercise should increase your heart rate and make you sweat (moderate-intensity exercise).  Do strengthening exercises at least twice a week. This is in addition to the moderate-intensity exercise.  Spend less time sitting. Even light physical activity can be beneficial. Watch cholesterol and blood lipids Have your blood tested for lipids and cholesterol at 73 years of age, then have this test every 5 years. Have your cholesterol levels checked more often if:  Your lipid or cholesterol levels are high.  You are older than 73 years of age.  You are at high risk for heart disease. What should I know about cancer screening? Depending on your health history and family history, you may need to have cancer screening at various ages. This may include screening for:  Breast cancer.  Cervical cancer.  Colorectal cancer.  Skin cancer.  Lung cancer. What should I know about heart disease, diabetes, and high blood  pressure? Blood pressure and heart disease  High blood pressure causes heart disease and increases the risk of stroke. This is more likely to develop in people who have high blood pressure readings, are of African descent, or are overweight.  Have your blood pressure checked: ? Every 3-5 years if you are 18-39 years of age. ? Every year if you are 40 years old or older. Diabetes Have regular diabetes screenings. This checks your fasting blood sugar level. Have the screening done:  Once every three years after age 40 if you are at a normal weight and have a low risk for diabetes.  More often and at a younger age if you are overweight or have a high risk for diabetes. What should I know about preventing infection? Hepatitis B If you have a higher risk for hepatitis B, you should be screened for this virus. Talk with your health care provider to find out if you are at risk for hepatitis B infection. Hepatitis C Testing is recommended for:  Everyone born from 1945 through 1965.  Anyone with known risk factors for hepatitis C. Sexually transmitted infections (STIs)  Get screened for STIs, including gonorrhea and chlamydia, if: ? You are sexually active and are younger than 73 years of age. ? You are older than 73 years of age and your health care provider tells you that you are at risk for this type of infection. ? Your sexual activity has changed since you were last screened, and you are at increased risk for chlamydia or gonorrhea. Ask your health care provider   if you are at risk.  Ask your health care provider about whether you are at high risk for HIV. Your health care provider may recommend a prescription medicine to help prevent HIV infection. If you choose to take medicine to prevent HIV, you should first get tested for HIV. You should then be tested every 3 months for as long as you are taking the medicine. Pregnancy  If you are about to stop having your period (premenopausal) and  you may become pregnant, seek counseling before you get pregnant.  Take 400 to 800 micrograms (mcg) of folic acid every day if you become pregnant.  Ask for birth control (contraception) if you want to prevent pregnancy. Osteoporosis and menopause Osteoporosis is a disease in which the bones lose minerals and strength with aging. This can result in bone fractures. If you are 18 years old or older, or if you are at risk for osteoporosis and fractures, ask your health care provider if you should:  Be screened for bone loss.  Take a calcium or vitamin D supplement to lower your risk of fractures.  Be given hormone replacement therapy (HRT) to treat symptoms of menopause. Follow these instructions at home: Lifestyle  Do not use any products that contain nicotine or tobacco, such as cigarettes, e-cigarettes, and chewing tobacco. If you need help quitting, ask your health care provider.  Do not use street drugs.  Do not share needles.  Ask your health care provider for help if you need support or information about quitting drugs. Alcohol use  Do not drink alcohol if: ? Your health care provider tells you not to drink. ? You are pregnant, may be pregnant, or are planning to become pregnant.  If you drink alcohol: ? Limit how much you use to 0-1 drink a day. ? Limit intake if you are breastfeeding.  Be aware of how much alcohol is in your drink. In the [place] one drink equals one 12 oz bottle of beer (355 mL), one 5 oz glass of wine (148 mL), or one 1 oz glass of hard liquor (44 mL). General instructions  Schedule regular health, dental, and eye exams.  Stay current with your vaccines.  Tell your health care provider if: ? You often feel depressed. ? You have ever been abused or do not feel safe at home. Summary  Adopting a healthy lifestyle and getting preventive care are important in promoting health and wellness.  Follow your health care provider's instructions about healthy  diet, exercising, and getting tested or screened for diseases.  Follow your health care provider's instructions on monitoring your cholesterol and blood pressure. This information is not intended to replace advice given to you by your health care provider. Make sure you discuss any questions you have with your health care provider. Document Revised: 11/18/2018 Document Reviewed: 11/18/2018 Elsevier Patient Education  2021 [place].   Take tylenol 500 mg two times a day for 3-5 days for your back pain.  Keep stretching.

## 2021-03-21 NOTE — Progress Notes (Signed)
I,Yamilka [employer] as a Education administrator for [employer], FNP.,have documented all relevant documentation on the behalf of Yvonne Brine, FNP,as directed by  Yvonne Brine, FNP while in the presence of Yvonne Barron, [place].  This visit occurred during the SARS-CoV-2 public health emergency.  Safety protocols were in place, including screening questions prior to the visit, additional usage of staff PPE, and extensive cleaning of exam room while observing appropriate contact time as indicated for disinfecting solutions.  Subjective:     Patient ID: Yvonne Barron , female    DOB: 1946/01/21 , 75 y.o.   MRN: [id number]   Chief Complaint  Patient presents with  . Annual Exam    HPI  Here for HM. Patient also stated she was bending over on Saturday and when she got up after reaching in the cabinet, her back started hurting. She is having radiating sharp pain to her left hip. "feels like someone is digging in her hip" she has been taking a muscle spasms and hydrocodone which has not been effective.   She takes vitamin c and vitamin d to help with her immune system.      Past Medical History:  Diagnosis Date  . Arthritis    knee Lt  . History of breast cancer 2009--  INVASIVE DUCTAL IN SITU--  S/P MASTECTOMY   NO RECURRENCE  . Hypertension   . Pre-diabetes   . Urethral caruncle      Family History  Problem Relation Age of Onset  . Diabetes Mother   . Diabetes Father   . Kidney disease Father   . Diabetes Brother      Current Outpatient Medications:  .  Artificial Tear Ointment (DRY EYES OP), Place 1 drop into both eyes daily as needed (Dry eyes). Thera tears, Disp: , Rfl:  .  calcium carbonate (OS-CAL - DOSED IN MG OF ELEMENTAL CALCIUM) 1250 (500 Ca) MG tablet, Take 1 tablet by mouth daily with breakfast. , Disp: , Rfl:  .  cholecalciferol (VITAMIN D) 1000 units tablet, Take 1,000 Units by mouth 2 (two) times daily., Disp: , Rfl:  .  hydrochlorothiazide (HYDRODIURIL) 12.5  MG tablet, Take 1 tablet by mouth once daily, Disp: 90 tablet, Rfl: 0 .  HYDROcodone-acetaminophen (NORCO/VICODIN) 5-325 MG tablet, hydrocodone 5 mg-acetaminophen 325 mg tablet, Disp: , Rfl:  .  Lancets (ONETOUCH DELICA PLUS [id number]) MISC, USE TO TEST BLOOD SUGAR BEFORE BREAKFAST AND DINNER, Disp: 100 each, Rfl: 0 .  methocarbamol (ROBAXIN) 500 MG tablet, methocarbamol 500 mg tablet  TAKE 1 TABLET BY MOUTH EVERY 6 HOURS AS NEEDED FOR SPASMS, Disp: , Rfl:  .  ondansetron (ZOFRAN) 4 MG tablet, Take 1 tablet (4 mg total) by mouth every 8 (eight) hours as needed for nausea, vomiting or refractory nausea / vomiting., Disp: 30 tablet, Rfl: 1 .  ONETOUCH VERIO test strip, USE 1 STRIP TO CHECK GLUCOSE ONCE DAILY, Disp: 100 each, Rfl: 0 .  oxyCODONE-acetaminophen (PERCOCET) 5-325 MG tablet, Take 1 tablet by mouth every 4 (four) hours as needed for severe pain., Disp: 30 tablet, Rfl: 0 .  Polyethyl Glycol-Propyl Glycol (SYSTANE) 0.4-0.3 % SOLN, Place 1 drop into both eyes at bedtime., Disp: , Rfl:  .  valACYclovir (VALTREX) 1000 MG tablet, Take 1,000 mg by mouth daily as needed (Cold sore). , Disp: , Rfl:  .  vitamin B-12 (CYANOCOBALAMIN) 500 MCG tablet, Take 500 mcg by mouth daily., Disp: , Rfl:  .  vitamin C (ASCORBIC ACID) 500 MG tablet, Take 500  mg by mouth daily., Disp: , Rfl:  .  predniSONE (STERAPRED UNI-PAK 21 TAB) 10 MG (21) TBPK tablet, prednisone 10 mg tablets in a dose pack  Take 1 dose pk by oral route., Disp: , Rfl:    No Known Allergies    The patient states she uses status post hysterectomy for birth control.  No LMP recorded. Patient has had a hysterectomy..  Negative for: breast discharge, breast lump(s), breast pain and breast self exam. Associated symptoms include abnormal vaginal bleeding. Pertinent negatives include abnormal bleeding (hematology), anxiety, decreased libido, depression, difficulty falling sleep, dyspareunia, history of infertility, nocturia, sexual dysfunction, sleep  disturbances, urinary incontinence, urinary urgency, vaginal discharge and vaginal itching. Diet regular; trying to eat clean, eat more greens and limit fried foods. The patient states her exercise level is moderate, exercising with a trainer.    The patient's tobacco use is:  Social History   Tobacco Use  Smoking Status Former Smoker  . Packs/day: 2.00  . Years: 20.00  . Pack years: 40.00  . Quit date: 01/19/1987  . Years since quitting: 34.2  Smokeless Tobacco Never Used   She has been exposed to passive smoke. The patient's alcohol use is:  Social History   Substance and Sexual Activity  Alcohol Use No       Review of Systems  Constitutional: Negative.   HENT: Negative.   Eyes: Negative.   Respiratory: Negative.   Cardiovascular: Negative.  Negative for chest pain, palpitations and leg swelling.  Gastrointestinal: Negative.   Endocrine: Negative.   Genitourinary: Negative.   Musculoskeletal: Positive for back pain (left sided back pain).       Sharp pain to left hip intermittent  Skin: Negative.   Allergic/Immunologic: Negative.   Neurological: Negative.  Negative for dizziness and headaches.  Hematological: Negative.   Psychiatric/Behavioral: Negative.      Today's Vitals   03/21/21 1027  BP: 122/84  Pulse: 68  Temp: 98 F (36.7 C)  Weight: 142 lb (64.4 kg)  Height: 5' 2.6" (1.59 m)  PainSc: 5   PainLoc: Back   Body mass index is 25.48 kg/m.   Objective:  Physical Exam Vitals reviewed.  Constitutional:      Appearance: Normal appearance. She is well-developed.  HENT:     Head: Normocephalic and atraumatic.     Right Ear: Hearing, tympanic membrane, ear canal and external ear normal. There is no impacted cerumen.     Left Ear: Hearing, tympanic membrane, ear canal and external ear normal. There is no impacted cerumen.     Nose:     Comments: Deferred - mask    Mouth/Throat:     Comments: Deferred- mask Eyes:     General: Lids are normal.      Conjunctiva/sclera: Conjunctivae normal.     Pupils: Pupils are equal, round, and reactive to light.     Funduscopic exam:    Right eye: No papilledema.        Left eye: No papilledema.  Neck:     Thyroid: No thyroid mass.     Vascular: No carotid bruit.  Cardiovascular:     Rate and Rhythm: Normal rate and regular rhythm.     Pulses: Normal pulses.     Heart sounds: Normal heart sounds. No murmur heard.   Pulmonary:     Effort: Pulmonary effort is normal. No respiratory distress.     Breath sounds: Normal breath sounds.  Chest:     Chest wall: No mass.  Breasts:     Tanner Score is 5.     Right: Normal.     Left: Absent.    Abdominal:     General: Abdomen is flat. Bowel sounds are normal.     Palpations: Abdomen is soft.  Musculoskeletal:        General: Tenderness (mild tenderness to left lower back) present. No swelling. Normal range of motion.     Cervical back: Full passive range of motion without pain, normal range of motion and neck supple.     Right lower leg: No edema.     Left lower leg: No edema.  Skin:    General: Skin is warm and dry.     Capillary Refill: Capillary refill takes less than 2 seconds.  Neurological:     General: No focal deficit present.     Mental Status: She is alert and oriented to person, place, and time.     Cranial Nerves: No cranial nerve deficit.     Sensory: No sensory deficit.  Psychiatric:        Mood and Affect: Mood normal.        Behavior: Behavior normal.        Thought Content: Thought content normal.        Judgment: Judgment normal.         Assessment And Plan:     1. Encounter for general adult medical examination w/o abnormal findings . Behavior modifications discussed and diet history reviewed.   . Pt will continue to exercise regularly and modify diet with low GI, plant based foods and decrease intake of processed foods.  . Recommend intake of daily multivitamin, Vitamin D, and calcium.  . Recommend mammogram  and colonoscopy for preventive screenings, as well as recommend immunizations that include influenza, TDAP  2. Essential hypertension . B/P is controlled.  . CMP ordered to check renal function.  . The importance of regular exercise and dietary modification was stressed to the patient.  . Stressed importance of losing ten percent of her body weight to help with B/P control.  . The weight loss would help with decreasing cardiac and cancer risk as well.  . EKG done with SR, no significant change to EKG from previous year - EKG 12-Lead - POCT Urinalysis Dipstick (81002) - POCT UA - Microalbumin  3. Pneumococcal vaccination declined  4. Acute left-sided low back pain without sciatica  She declines toradol injection and would like to try tylenol/ibuprofen as needed and stretching. Negative straight leg raise    Patient was given opportunity to ask questions. Patient verbalized understanding of the plan and was able to repeat key elements of the plan. All questions were answered to their satisfaction.   Yvonne Brine, FNP     I, Yvonne Brine, FNP, have reviewed all documentation for this visit. The documentation on 03/21/21 for the exam, diagnosis, procedures, and orders are all accurate and complete.  THE PATIENT IS ENCOURAGED TO PRACTICE SOCIAL DISTANCING DUE TO THE COVID-19 PANDEMIC.

## 2021-03-27 DIAGNOSIS — M25551 Pain in right hip: Secondary | ICD-10-CM | POA: Diagnosis not present

## 2021-03-27 DIAGNOSIS — M5459 Other low back pain: Secondary | ICD-10-CM | POA: Diagnosis not present

## 2021-03-28 ENCOUNTER — Ambulatory Visit (INDEPENDENT_AMBULATORY_CARE_PROVIDER_SITE_OTHER): Payer: Medicare HMO

## 2021-03-28 VITALS — Ht 63.0 in | Wt 141.0 lb

## 2021-03-28 DIAGNOSIS — Z0000 Encounter for general adult medical examination without abnormal findings: Secondary | ICD-10-CM | POA: Diagnosis not present

## 2021-03-28 NOTE — Progress Notes (Signed)
I connected with Yvonne Barron today by telephone and verified that I am speaking with the correct person using two identifiers. Location patient: home Location provider: work Persons participating in the virtual visit: Kamela Blansett, Glenna Durand LPN.   I discussed the limitations, risks, security and privacy concerns of performing an evaluation and management service by telephone and the availability of in person appointments. I also discussed with the patient that there may be a patient responsible charge related to this service. The patient expressed understanding and verbally consented to this telephonic visit.    Interactive audio and video telecommunications were attempted between this provider and patient, however failed, due to patient having technical difficulties OR patient did not have access to video capability.  We continued and completed visit with audio only.     Vital signs may be patient reported or missing.  Subjective:   Yvonne Barron is a 75 y.o. female who presents for Medicare Annual (Subsequent) preventive examination.  Review of Systems     Cardiac Risk Factors include: advanced age (>59men, >80 women);diabetes mellitus;hypertension     Objective:    Today's Vitals   03/28/21 0829  Weight: 141 lb (64 kg)  Height: 5\' 3"  (1.6 m)   Body mass index is 24.98 kg/m.  Advanced Directives 03/28/2021 07/28/2020 07/18/2020 03/16/2020 03/11/2019 11/28/2016 11/26/2016  Does Patient Have a Medical Advance Directive? Yes Yes Yes Yes No Yes Yes  Type of Paramedic of [place];Living will [place];Living will Living will [place];Living will - [place];Living will -  Does patient want to make changes to medical advance directive? - No - Patient declined - - - No - Patient declined -  Copy of [place] in Chart? No - copy requested No - copy requested - No - copy  requested - No - copy requested -  Would patient like information on creating a medical advance directive? - - - - Yes (MAU/Ambulatory/Procedural Areas - Information given) - -  Pre-existing out of facility DNR order (yellow form or pink MOST form) - - - - - - -    Current Medications (verified) Outpatient Encounter Medications as of 03/28/2021  Medication Sig  . Artificial Tear Ointment (DRY EYES OP) Place 1 drop into both eyes daily as needed (Dry eyes). Thera tears  . calcium carbonate (OS-CAL - DOSED IN MG OF ELEMENTAL CALCIUM) 1250 (500 Ca) MG tablet Take 1 tablet by mouth daily with breakfast.   . cholecalciferol (VITAMIN D) 1000 units tablet Take 1,000 Units by mouth 2 (two) times daily.  . hydrochlorothiazide (HYDRODIURIL) 12.5 MG tablet Take 1 tablet by mouth once daily  . HYDROcodone-acetaminophen (NORCO/VICODIN) 5-325 MG tablet hydrocodone 5 mg-acetaminophen 325 mg tablet  . Lancets (ONETOUCH DELICA PLUS [id number]) MISC USE TO TEST BLOOD SUGAR BEFORE BREAKFAST AND DINNER  . methocarbamol (ROBAXIN) 500 MG tablet methocarbamol 500 mg tablet  TAKE 1 TABLET BY MOUTH EVERY 6 HOURS AS NEEDED FOR SPASMS  . ondansetron (ZOFRAN) 4 MG tablet Take 1 tablet (4 mg total) by mouth every 8 (eight) hours as needed for nausea, vomiting or refractory nausea / vomiting.  [name] VERIO test strip USE 1 STRIP TO CHECK GLUCOSE ONCE DAILY  . oxyCODONE-acetaminophen (PERCOCET) 5-325 MG tablet Take 1 tablet by mouth every 4 (four) hours as needed for severe pain.  [name] Glycol-Propyl Glycol (SYSTANE) 0.4-0.3 % SOLN Place 1 drop into both eyes at bedtime.  . valACYclovir (  VALTREX) 1000 MG tablet Take 1,000 mg by mouth daily as needed (Cold sore).   . vitamin B-12 (CYANOCOBALAMIN) 500 MCG tablet Take 500 mcg by mouth daily.  . vitamin C (ASCORBIC ACID) 500 MG tablet Take 500 mg by mouth daily.   No facility-administered encounter medications on file as of 03/28/2021.    Allergies  (verified) Patient has no known allergies.   History: Past Medical History:  Diagnosis Date  . Arthritis    knee Lt  . History of breast cancer 2009--  INVASIVE DUCTAL IN SITU--  S/P MASTECTOMY   NO RECURRENCE  . Hypertension   . Pre-diabetes   . Urethral caruncle    Past Surgical History:  Procedure Laterality Date  . CYSTOSCOPY WITH URETHRAL CARUNCLE  07/10/2012   Procedure: CYSTOSCOPY WITH URETHRAL CARUNCLE;  Surgeon: Claybon Jabs, MD;  Location: [facility];  Service: Urology;  Laterality: N/A;    EXCISION OF CARUNCLE   . LEFT TOTAL MASTECTOMY/ LEFT AXILLARY SENTINEL LYMPH NODE BX  03-01-2008   INVASIVE  DUCTAL CARCINOMA IN SITU  . LEFT WRIST SURG  2008   REPAIR NERVE  . MASTECTOMY Left 2009  . TOE SURGERY Right 2017   Great toe  . TOTAL KNEE ARTHROPLASTY Left 07/28/2020   Procedure: TOTAL KNEE ARTHROPLASTY;  Surgeon: Netta Cedars, MD;  Location: [facility];  Service: Orthopedics;  Laterality: Left;  Marland Kitchen VAGINAL HYSTERECTOMY  1998   W/ BILATERAL SALPINGOOPHECTOMY   Family History  Problem Relation Age of Onset  . Diabetes Mother   . Diabetes Father   . Kidney disease Father   . Diabetes Brother    Social History   Socioeconomic History  . Marital status: Divorced    Spouse name: Not on file  . Number of children: Not on file  . Years of education: Not on file  . Highest education level: Not on file  Occupational History  . Occupation: semi- retired  Tobacco Use  . Smoking status: Former Smoker    Packs/day: 2.00    Years: 20.00    Pack years: 40.00    Quit date: 01/19/1987    Years since quitting: 34.2  . Smokeless tobacco: Never Used  Vaping Use  . Vaping Use: Never used  Substance and Sexual Activity  . Alcohol use: No  . Drug use: No  . Sexual activity: Not Currently  Other Topics Concern  . Not on file  Social History Narrative  . Not on file   Social Determinants of Health   Financial Resource Strain: Low Risk   . Difficulty of  Paying Living Expenses: Not hard at all  Food Insecurity: No Food Insecurity  . Worried About Charity fundraiser in the Last Year: Never true  . Ran Out of Food in the Last Year: Never true  Transportation Needs: No Transportation Needs  . Lack of Transportation (Medical): No  . Lack of Transportation (Non-Medical): No  Physical Activity: Insufficiently Active  . Days of Exercise per Week: 3 days  . Minutes of Exercise per Session: 40 min  Stress: No Stress Concern Present  . Feeling of Stress : Not at all  Social Connections: Not on file    Tobacco Counseling Counseling given: Not Answered   Clinical Intake:  Pre-visit preparation completed: Yes  Pain : No/denies pain     Nutritional Status: BMI of 19-24  Normal Nutritional Risks: None Diabetes: No  How often do you need to have someone help you when you  read instructions, pamphlets, or other written materials from your doctor or pharmacy?: 1 - Never What is the last grade level you completed in school?: 49yrs college  Diabetic? Yes Nutrition Risk Assessment:  Has the patient had any N/V/D within the last 2 months?  No  Does the patient have any non-healing wounds?  No  Has the patient had any unintentional weight loss or weight gain?  No   Diabetes:  Is the patient diabetic?  Yes  If diabetic, was a CBG obtained today?  No  Did the patient bring in their glucometer from home?  No  How often do you monitor your CBG's? daily.   Financial Strains and Diabetes Management:  Are you having any financial strains with the device, your supplies or your medication? No .  Does the patient want to be seen by Chronic Care Management for management of their diabetes?  No  Would the patient like to be referred to a Nutritionist or for Diabetic Management?  No   Diabetic Exams:  Diabetic Eye Exam: Completed 06/26/2020 Diabetic Foot Exam: Completed 06/15/2020  Interpreter Needed?: No  Information entered by :: NAllen  LPN   Activities of Daily Living In your present state of health, do you have any difficulty performing the following activities: 03/28/2021 03/21/2021  Hearing? N N  Vision? Y N  Comment macular degeneration -  Difficulty concentrating or making decisions? N N  Walking or climbing stairs? N N  Dressing or bathing? N N  Doing errands, shopping? - Scientist, forensic and eating ? N -  Using the Toilet? N -  In the past six months, have you accidently leaked urine? N -  Do you have problems with loss of bowel control? N -  Managing your Medications? N -  Managing your Finances? N -  Housekeeping or managing your Housekeeping? N -  Some recent data might be hidden    Patient Care Team: Minette Brine, FNP as PCP - General (General Practice)  Indicate any recent Medical Services you may have received from other than [facility] providers in the past year (date may be approximate).     Assessment:   This is a routine wellness examination for [place].  Hearing/Vision screen No exam data present  Dietary issues and exercise activities discussed: Current Exercise Habits: Home exercise routine, Type of exercise: strength training/weights;calisthenics, Time (Minutes): 45, Frequency (Times/Week): 3, Weekly Exercise (Minutes/Week): 135  Goals    . HEMOGLOBIN A1C < 7.0     Healthy diet and lose 10 pounds    . Patient Stated     03/16/2020, wants to eat cleaner and continue to exercise, maintain A1C    . Patient Stated     03/28/2021, get knee back      Depression Screen PHQ 2/9 Scores 03/28/2021 03/21/2021 03/16/2020 01/20/2020 10/20/2019 07/19/2019 04/15/2019  PHQ - 2 Score 0 0 0 0 0 0 0  PHQ- 9 Score - - 0 - - - 0    Fall Risk Fall Risk  03/28/2021 03/21/2021 03/16/2020 01/20/2020 10/20/2019  Falls in the past year? 0 0 0 0 0  Risk for fall due to : Medication side effect - Medication side effect - -  Follow up Falls evaluation completed;Education provided;Falls prevention discussed - Falls  evaluation completed;Education provided;Falls prevention discussed - -    FALL RISK PREVENTION PERTAINING TO THE HOME:  Any stairs in or around the home? Yes  If so, are there any without handrails? No  Home  free of loose throw rugs in walkways, pet beds, electrical cords, etc? Yes  Adequate lighting in your home to reduce risk of falls? Yes   ASSISTIVE DEVICES UTILIZED TO PREVENT FALLS:  Life alert? No  Use of a cane, walker or w/c? No  Grab bars in the bathroom? Yes  Shower chair or bench in shower? No  Elevated toilet seat or a handicapped toilet? Yes   TIMED UP AND GO:  Was the test performed? No .     Cognitive Function:     6CIT Screen 03/28/2021 03/16/2020 03/11/2019  What Year? 0 points 0 points 0 points  What month? 0 points 0 points 0 points  What time? 0 points 0 points 0 points  Count back from 20 2 points 0 points 0 points  Months in reverse 2 points 0 points 0 points  Repeat phrase 0 points 0 points 0 points  Total Score 4 0 0    Immunizations Immunization History  Administered Date(s) Administered  . Fluad Quad(high Dose 65+) 09/19/2020  . Influenza,inj,quad, With Preservative 09/09/2019  . Influenza-Unspecified 09/23/2018  . PFIZER(Purple Top)SARS-COV-2 Vaccination 01/22/2020, 02/16/2020, 10/26/2020  . Tdap 09/27/2012    TDAP status: Up to date  Flu Vaccine status: Up to date  Pneumococcal vaccine status: Declined,  Education has been provided regarding the importance of this vaccine but patient still declined. Advised may receive this vaccine at local pharmacy or Health Dept. Aware to provide a copy of the vaccination record if obtained from local pharmacy or Health Dept. Verbalized acceptance and understanding.   Covid-19 vaccine status: Completed vaccines  Qualifies for Shingles Vaccine? Yes   Zostavax completed No   Shingrix Completed?: No.    Education has been provided regarding the importance of this vaccine. Patient has been advised to call  insurance company to determine out of pocket expense if they have not yet received this vaccine. Advised may also receive vaccine at local pharmacy or Health Dept. Verbalized acceptance and understanding.  Screening Tests Health Maintenance  Topic Date Due  . PNA vac Low Risk Adult (1 of 2 - PCV13) 09/13/2021 (Originally 04/01/2013)  . COVID-19 Vaccine (4 - Booster for Pfizer series) 04/25/2021  . FOOT EXAM  06/15/2021  . OPHTHALMOLOGY EXAM  06/26/2021  . INFLUENZA VACCINE  07/09/2021  . HEMOGLOBIN A1C  07/22/2021  . URINE MICROALBUMIN  03/21/2022  . MAMMOGRAM  06/26/2022  . TETANUS/TDAP  09/27/2022  . COLONOSCOPY (Pts 45-25yrs Insurance coverage will need to be confirmed)  01/19/2029  . DEXA SCAN  Completed  . Hepatitis C Screening  Completed  . HPV VACCINES  Aged Out    Health Maintenance  There are no preventive care reminders to display for this patient.  Colorectal cancer screening: Type of screening: Colonoscopy. Completed 01/19/2019. Repeat every 10 years  Mammogram status: Completed 06/26/2020. Repeat every year  Bone Density status: Completed 12/12/2020.   Lung Cancer Screening: (Low Dose CT Chest recommended if Age 82-80 years, 30 pack-year currently smoking OR have quit w/in 15years.) does not qualify.   Lung Cancer Screening Referral: no  Additional Screening:  Hepatitis C Screening: does qualify; Completed 05/30/2014   Vision Screening: Recommended annual ophthalmology exams for early detection of glaucoma and other disorders of the eye. Is the patient up to date with their annual eye exam?  Yes  Who is the provider or what is the name of the office in which the patient attends annual eye exams? Dr. Katy Fitch If pt is not established with  a provider, would they like to be referred to a provider to establish care? No .   Dental Screening: Recommended annual dental exams for proper oral hygiene  Community Resource Referral / Chronic Care Management: CRR required this  visit?  No   CCM required this visit?  No      Plan:     I have personally reviewed and noted the following in the patient's chart:   . Medical and social history . Use of alcohol, tobacco or illicit drugs  . Current medications and supplements . Functional ability and status . Nutritional status . Physical activity . Advanced directives . List of other physicians . Hospitalizations, surgeries, and ER visits in previous 12 months . Vitals . Screenings to include cognitive, depression, and falls . Referrals and appointments  In addition, I have reviewed and discussed with patient certain preventive protocols, quality metrics, and best practice recommendations. A written personalized care plan for preventive services as well as general preventive health recommendations were provided to patient.     Kellie Simmering, LPN   5/99/2341   Nurse Notes:

## 2021-03-28 NOTE — Patient Instructions (Signed)
Ms. Panjwani , Thank you for taking time to come for your Medicare Wellness Visit. I appreciate your ongoing commitment to your health goals. Please review the following plan we discussed and let me know if I can assist you in the future.   Screening recommendations/referrals: Colonoscopy: completed 01/19/2019 Mammogram: completed 06/26/2020 Bone Density: completed 12/12/2020 Recommended yearly ophthalmology/optometry visit for glaucoma screening and checkup Recommended yearly dental visit for hygiene and checkup  Vaccinations: Influenza vaccine: completed 09/19/2020, due 07/09/2021 Pneumococcal vaccine: decline Tdap vaccine: completed 09/27/2012, due 09/27/2022 Shingles vaccine: discussed   Covid-19: 10/26/2020, 02/16/2020, 01/22/2020  Advanced directives: Please bring a copy of your POA (Power of Attorney) and/or Living Will to your next appointment.   Conditions/risks identified: none  Next appointment: Follow up in one year for your annual wellness visit    Preventive Care 65 Years and Older, Female Preventive care refers to lifestyle choices and visits with your health care provider that can promote health and wellness. What does preventive care include?  A yearly physical exam. This is also called an annual well check.  Dental exams once or twice a year.  Routine eye exams. Ask your health care provider how often you should have your eyes checked.  Personal lifestyle choices, including:  Daily care of your teeth and gums.  Regular physical activity.  Eating a healthy diet.  Avoiding tobacco and drug use.  Limiting alcohol use.  Practicing safe sex.  Taking low-dose aspirin every day.  Taking vitamin and mineral supplements as recommended by your health care provider. What happens during an annual well check? The services and screenings done by your health care provider during your annual well check will depend on your age, overall health, lifestyle risk factors, and  family history of disease. Counseling  Your health care provider may ask you questions about your:  Alcohol use.  Tobacco use.  Drug use.  Emotional well-being.  Home and relationship well-being.  Sexual activity.  Eating habits.  History of falls.  Memory and ability to understand (cognition).  Work and work Statistician.  Reproductive health. Screening  You may have the following tests or measurements:  Height, weight, and BMI.  Blood pressure.  Lipid and cholesterol levels. These may be checked every 5 years, or more frequently if you are over 68 years old.  Skin check.  Lung cancer screening. You may have this screening every year starting at age 34 if you have a 30-pack-year history of smoking and currently smoke or have quit within the past 15 years.  Fecal occult blood test (FOBT) of the stool. You may have this test every year starting at age 77.  Flexible sigmoidoscopy or colonoscopy. You may have a sigmoidoscopy every 5 years or a colonoscopy every 10 years starting at age 17.  Hepatitis C blood test.  Hepatitis B blood test.  Sexually transmitted disease (STD) testing.  Diabetes screening. This is done by checking your blood sugar (glucose) after you have not eaten for a while (fasting). You may have this done every 1-3 years.  Bone density scan. This is done to screen for osteoporosis. You may have this done starting at age 24.  Mammogram. This may be done every 1-2 years. Talk to your health care provider about how often you should have regular mammograms. Talk with your health care provider about your test results, treatment options, and if necessary, the need for more tests. Vaccines  Your health care provider may recommend certain vaccines, such as:  Influenza vaccine.  This is recommended every year.  Tetanus, diphtheria, and acellular pertussis (Tdap, Td) vaccine. You may need a Td booster every 10 years.  Zoster vaccine. You may need this  after age 20.  Pneumococcal 13-valent conjugate (PCV13) vaccine. One dose is recommended after age 34.  Pneumococcal polysaccharide (PPSV23) vaccine. One dose is recommended after age 58. Talk to your health care provider about which screenings and vaccines you need and how often you need them. This information is not intended to replace advice given to you by your health care provider. Make sure you discuss any questions you have with your health care provider. Document Released: 12/22/2015 Document Revised: 08/14/2016 Document Reviewed: 09/26/2015 Elsevier Interactive Patient Education  2017 [place] Prevention in the Home Falls can cause injuries. They can happen to people of all ages. There are many things you can do to make your home safe and to help prevent falls. What can I do on the outside of my home?  Regularly fix the edges of walkways and driveways and fix any cracks.  Remove anything that might make you trip as you walk through a door, such as a raised step or threshold.  Trim any bushes or trees on the path to your home.  Use bright outdoor lighting.  Clear any walking paths of anything that might make someone trip, such as rocks or tools.  Regularly check to see if handrails are loose or broken. Make sure that both sides of any steps have handrails.  Any raised decks and porches should have guardrails on the edges.  Have any leaves, snow, or ice cleared regularly.  Use sand or salt on walking paths during winter.  Clean up any spills in your garage right away. This includes oil or grease spills. What can I do in the bathroom?  Use night lights.  Install grab bars by the toilet and in the tub and shower. Do not use towel bars as grab bars.  Use non-skid mats or decals in the tub or shower.  If you need to sit down in the shower, use a plastic, non-slip stool.  Keep the floor dry. Clean up any water that spills on the floor as soon as it  happens.  Remove soap buildup in the tub or shower regularly.  Attach bath mats securely with double-sided non-slip rug tape.  Do not have throw rugs and other things on the floor that can make you trip. What can I do in the bedroom?  Use night lights.  Make sure that you have a light by your bed that is easy to reach.  Do not use any sheets or blankets that are too big for your bed. They should not hang down onto the floor.  Have a firm chair that has side arms. You can use this for support while you get dressed.  Do not have throw rugs and other things on the floor that can make you trip. What can I do in the kitchen?  Clean up any spills right away.  Avoid walking on wet floors.  Keep items that you use a lot in easy-to-reach places.  If you need to reach something above you, use a strong step stool that has a grab bar.  Keep electrical cords out of the way.  Do not use floor polish or wax that makes floors slippery. If you must use wax, use non-skid floor wax.  Do not have throw rugs and other things on the floor that can make  you trip. What can I do with my stairs?  Do not leave any items on the stairs.  Make sure that there are handrails on both sides of the stairs and use them. Fix handrails that are broken or loose. Make sure that handrails are as long as the stairways.  Check any carpeting to make sure that it is firmly attached to the stairs. Fix any carpet that is loose or worn.  Avoid having throw rugs at the top or bottom of the stairs. If you do have throw rugs, attach them to the floor with carpet tape.  Make sure that you have a light switch at the top of the stairs and the bottom of the stairs. If you do not have them, ask someone to add them for you. What else can I do to help prevent falls?  Wear shoes that:  Do not have high heels.  Have rubber bottoms.  Are comfortable and fit you well.  Are closed at the toe. Do not wear sandals.  If you  use a stepladder:  Make sure that it is fully opened. Do not climb a closed stepladder.  Make sure that both sides of the stepladder are locked into place.  Ask someone to hold it for you, if possible.  Clearly mark and make sure that you can see:  Any grab bars or handrails.  First and last steps.  Where the edge of each step is.  Use tools that help you move around (mobility aids) if they are needed. These include:  Canes.  Walkers.  Scooters.  Crutches.  Turn on the lights when you go into a dark area. Replace any light bulbs as soon as they burn out.  Set up your furniture so you have a clear path. Avoid moving your furniture around.  If any of your floors are uneven, fix them.  If there are any pets around you, be aware of where they are.  Review your medicines with your doctor. Some medicines can make you feel dizzy. This can increase your chance of falling. Ask your doctor what other things that you can do to help prevent falls. This information is not intended to replace advice given to you by your health care provider. Make sure you discuss any questions you have with your health care provider. Document Released: 09/21/2009 Document Revised: 05/02/2016 Document Reviewed: 12/30/2014 Elsevier Interactive Patient Education  2017 [employer].

## 2021-04-02 ENCOUNTER — Encounter: Payer: Self-pay | Admitting: Emergency Medicine

## 2021-04-02 ENCOUNTER — Other Ambulatory Visit: Payer: Self-pay

## 2021-04-02 ENCOUNTER — Ambulatory Visit
Admission: EM | Admit: 2021-04-02 | Discharge: 2021-04-02 | Disposition: A | Discharge status: Home / Self Care | Payer: Medicare HMO

## 2021-04-02 DIAGNOSIS — M5441 Lumbago with sciatica, right side: Secondary | ICD-10-CM

## 2021-04-02 MED ORDER — KETOROLAC TROMETHAMINE 30 MG/ML IJ SOLN
30.0000 mg | Freq: Once | INTRAMUSCULAR | Status: AC
  Administered 2021-04-02: 30 mg via INTRAMUSCULAR

## 2021-04-02 NOTE — ED Triage Notes (Addendum)
Pt presents with pain that starts in right hip/ buttocks pain that radiates down into leg xs 1-2 weeks.   States was prescribed prednisone and a muscle relaxer but stopped due to reading side effects for the medications.

## 2021-04-02 NOTE — ED Provider Notes (Signed)
[facility]    CSN: [id number] Arrival date & time: 04/02/21  1054      History   Chief Complaint Chief Complaint  Patient presents with  . Hip Pain    HPI Yvonne Barron is a 75 y.o. female presenting today for evaluation of right hip pain.  Reports symptoms for approximately 1 to 2 weeks.  Denies injury or fall.  Denies history of similar.  Pain radiating to lateral aspect of leg/right knee.  Denies any numbness or tingling.  Denies urinary symptoms.  Denies saddle anesthesia.  Denies loss of control of bowel/bladder.  Was seen at orthopedics recently and recommended muscle relaxer and prednisone.  Patient expresses concern over taking prednisone due to side effects as well as concerns about taking any medicines that might affect her kidney/liver.  HPI  Past Medical History:  Diagnosis Date  . Arthritis    knee Lt  . History of breast cancer 2009--  INVASIVE DUCTAL IN SITU--  S/P MASTECTOMY   NO RECURRENCE  . Hypertension   . Pre-diabetes   . Urethral caruncle     Patient Active Problem List   Diagnosis Date Noted  . H/O total knee replacement, left 07/28/2020  . Type 2 diabetes mellitus without complication, without long-term current use of insulin ([place]) 04/15/2019  . History of arthroscopic procedure on shoulder 11/13/2018  . Pain in right knee 11/13/2018  . Abnormal glucose 09/14/2018  . Vitamin D deficiency 08/29/2018  . Cervicalgia 08/29/2018  . Decreased estrogen level 08/29/2018  . Urinary frequency 08/29/2018  . Cancer ([place]) 05/18/2018  . Urethral caruncle 07/10/2012  . Breast cancer, left breast ([place]) 08/06/2011  . PARESTHESIA 07/01/2008  . Breast cancer in situ 12/10/2007  . Essential hypertension 06/17/2007    Past Surgical History:  Procedure Laterality Date  . CYSTOSCOPY WITH URETHRAL CARUNCLE  07/10/2012   Procedure: CYSTOSCOPY WITH URETHRAL CARUNCLE;  Surgeon: Claybon Jabs, MD;  Location: [facility];  Service:  Urology;  Laterality: N/A;    EXCISION OF CARUNCLE   . LEFT TOTAL MASTECTOMY/ LEFT AXILLARY SENTINEL LYMPH NODE BX  03-01-2008   INVASIVE  DUCTAL CARCINOMA IN SITU  . LEFT WRIST SURG  2008   REPAIR NERVE  . MASTECTOMY Left 2009  . TOE SURGERY Right 2017   Great toe  . TOTAL KNEE ARTHROPLASTY Left 07/28/2020   Procedure: TOTAL KNEE ARTHROPLASTY;  Surgeon: Netta Cedars, MD;  Location: [facility];  Service: Orthopedics;  Laterality: Left;  Marland Kitchen VAGINAL HYSTERECTOMY  1998   W/ BILATERAL SALPINGOOPHECTOMY    OB History   No obstetric history on file.      Home Medications    Prior to Admission medications   Medication Sig Start Date End Date Taking? Authorizing Provider  Artificial Tear Ointment (DRY EYES OP) Place 1 drop into both eyes daily as needed (Dry eyes). Thera tears    [provider]  calcium carbonate (OS-CAL - DOSED IN MG OF ELEMENTAL CALCIUM) 1250 (500 Ca) MG tablet Take 1 tablet by mouth daily with breakfast.     [provider]  cholecalciferol (VITAMIN D) 1000 units tablet Take 1,000 Units by mouth 2 (two) times daily.    [provider]  hydrochlorothiazide (HYDRODIURIL) 12.5 MG tablet Take 1 tablet by mouth once daily 07/25/20   Minette Brine, FNP  HYDROcodone-acetaminophen (NORCO/VICODIN) 5-325 MG tablet hydrocodone 5 mg-acetaminophen 325 mg tablet    [provider]  Lancets (ONETOUCH DELICA PLUS [id number]) MISC USE TO  TEST BLOOD SUGAR BEFORE BREAKFAST AND DINNER 02/12/21   Minette Brine, FNP  methocarbamol (ROBAXIN) 500 MG tablet methocarbamol 500 mg tablet  TAKE 1 TABLET BY MOUTH EVERY 6 HOURS AS NEEDED FOR SPASMS    [provider]  ondansetron (ZOFRAN) 4 MG tablet Take 1 tablet (4 mg total) by mouth every 8 (eight) hours as needed for nausea, vomiting or refractory nausea / vomiting. 07/28/20 07/28/21  Netta Cedars, MD  [facility] VERIO test strip USE 1 STRIP TO CHECK GLUCOSE ONCE DAILY 02/12/21   Minette Brine, FNP   oxyCODONE-acetaminophen (PERCOCET) 5-325 MG tablet Take 1 tablet by mouth every 4 (four) hours as needed for severe pain. 07/28/20 07/28/21  Netta Cedars, MD  Polyethyl Glycol-Propyl Glycol (SYSTANE) 0.4-0.3 % SOLN Place 1 drop into both eyes at bedtime.    [provider]  predniSONE (STERAPRED UNI-PAK 21 TAB) 10 MG (21) TBPK tablet prednisone 10 mg tablets in a dose pack  Take 1 dose pk by oral route.    [provider]  valACYclovir (VALTREX) 1000 MG tablet Take 1,000 mg by mouth daily as needed (Cold sore).     [provider]  vitamin B-12 (CYANOCOBALAMIN) 500 MCG tablet Take 500 mcg by mouth daily.    [provider]  vitamin C (ASCORBIC ACID) 500 MG tablet Take 500 mg by mouth daily.    [provider]    Family History Family History  Problem Relation Age of Onset  . Diabetes Mother   . Diabetes Father   . Kidney disease Father   . Diabetes Brother     Social History Social History   Tobacco Use  . Smoking status: Former Smoker    Packs/day: 2.00    Years: 20.00    Pack years: 40.00    Quit date: 01/19/1987    Years since quitting: 34.2  . Smokeless tobacco: Never Used  Vaping Use  . Vaping Use: Never used  Substance Use Topics  . Alcohol use: No  . Drug use: No     Allergies   Patient has no known allergies.   Review of Systems Review of Systems  Constitutional: Negative for fatigue and fever.  Eyes: Negative for visual disturbance.  Respiratory: Negative for shortness of breath.   Cardiovascular: Negative for chest pain.  Gastrointestinal: Negative for abdominal pain, nausea and vomiting.  Musculoskeletal: Positive for back pain and myalgias. Negative for arthralgias and joint swelling.  Skin: Negative for color change, rash and wound.  Neurological: Negative for dizziness, weakness, light-headedness and headaches.     Physical Exam Triage Vital Signs ED Triage Vitals  Enc Vitals Group     BP 04/02/21  1241 (!) 162/97     Pulse Rate 04/02/21 1241 70     Resp 04/02/21 1241 17     Temp 04/02/21 1241 98.2 F (36.8 C)     Temp Source 04/02/21 1241 Oral     SpO2 04/02/21 1241 97 %     Weight --      Height --      Head Circumference --      Peak Flow --      Pain Score 04/02/21 1239 10     Pain Loc --      Pain Edu? --      Excl. in [place]? --    No data found.  Updated Vital Signs BP (!) 162/97 (BP Location: Left Arm)   Pulse 70   Temp 98.2 F (36.8 C) (Oral)  Resp 17   SpO2 97%   Visual Acuity Right Eye Distance:   Left Eye Distance:   Bilateral Distance:    Right Eye Near:   Left Eye Near:    Bilateral Near:     Physical Exam Vitals and nursing note reviewed.  Constitutional:      Appearance: She is well-developed.     Comments: No acute distress  HENT:     Head: Normocephalic and atraumatic.     Nose: Nose normal.  Eyes:     Conjunctiva/sclera: Conjunctivae normal.  Cardiovascular:     Rate and Rhythm: Normal rate.  Pulmonary:     Effort: Pulmonary effort is normal. No respiratory distress.  Abdominal:     General: There is no distension.  Musculoskeletal:        General: Normal range of motion.     Cervical back: Neck supple.  Skin:    General: Skin is warm and dry.  Neurological:     Mental Status: She is alert and oriented to person, place, and time.      UC Treatments / Results  Labs (all labs ordered are listed, but only abnormal results are displayed) Labs Reviewed - No data to display  EKG   Radiology No results found.  Procedures Procedures (including critical care time)  Medications Ordered in UC Medications  ketorolac (TORADOL) 30 MG/ML injection 30 mg (30 mg Intramuscular Given 04/02/21 1344)    Initial Impression / Assessment and Plan / UC Course  I have reviewed the triage vital signs and the nursing notes.  Pertinent labs & imaging results that were available during my care of the patient were reviewed by me and considered  in my medical decision making (see chart for details).     Right-sided low back pain with right-sided sciatica-provided Toradol prior to discharge, recommended to continue some form of anti-inflammatories, will continue with Tylenol, patient may restart course of prednisone if still having discomfort.  Expressed that these medicines are safe used in the short-term and should not affect her kidney/liver.  Discussed strict return precautions. Patient verbalized understanding and is agreeable with plan.  Final Clinical Impressions(s) / UC Diagnoses   Final diagnoses:  Acute right-sided low back pain with right-sided sciatica     Discharge Instructions     We gave you 30 mg Toradol Continue with Tylenol [phone] mg every 4-6 hours for pain Alternate ice and heat Gentle stretching May restart prescription for prednisone if still having pain Please return if not improving or worsening    ED Prescriptions    None     PDMP not reviewed this encounter.   Janith Lima, PA-C 04/02/21 1356

## 2021-04-02 NOTE — Discharge Instructions (Signed)
We gave you 30 mg Toradol Continue with Tylenol [phone] mg every 4-6 hours for pain Alternate ice and heat Gentle stretching May restart prescription for prednisone if still having pain Please return if not improving or worsening

## 2021-04-23 ENCOUNTER — Ambulatory Visit: Payer: Medicare HMO | Admitting: Nurse Practitioner

## 2021-05-03 ENCOUNTER — Other Ambulatory Visit: Payer: Self-pay | Admitting: Nurse Practitioner

## 2021-05-03 DIAGNOSIS — R7309 Other abnormal glucose: Secondary | ICD-10-CM

## 2021-05-16 DIAGNOSIS — M9905 Segmental and somatic dysfunction of pelvic region: Secondary | ICD-10-CM | POA: Diagnosis not present

## 2021-05-16 DIAGNOSIS — M546 Pain in thoracic spine: Secondary | ICD-10-CM | POA: Diagnosis not present

## 2021-05-16 DIAGNOSIS — M9903 Segmental and somatic dysfunction of lumbar region: Secondary | ICD-10-CM | POA: Diagnosis not present

## 2021-05-16 DIAGNOSIS — M9904 Segmental and somatic dysfunction of sacral region: Secondary | ICD-10-CM | POA: Diagnosis not present

## 2021-05-16 DIAGNOSIS — M5451 Vertebrogenic low back pain: Secondary | ICD-10-CM | POA: Diagnosis not present

## 2021-05-17 DIAGNOSIS — M9904 Segmental and somatic dysfunction of sacral region: Secondary | ICD-10-CM | POA: Diagnosis not present

## 2021-05-17 DIAGNOSIS — M5451 Vertebrogenic low back pain: Secondary | ICD-10-CM | POA: Diagnosis not present

## 2021-05-17 DIAGNOSIS — M9903 Segmental and somatic dysfunction of lumbar region: Secondary | ICD-10-CM | POA: Diagnosis not present

## 2021-05-17 DIAGNOSIS — M546 Pain in thoracic spine: Secondary | ICD-10-CM | POA: Diagnosis not present

## 2021-05-17 DIAGNOSIS — M9905 Segmental and somatic dysfunction of pelvic region: Secondary | ICD-10-CM | POA: Diagnosis not present

## 2021-05-23 ENCOUNTER — Ambulatory Visit (INDEPENDENT_AMBULATORY_CARE_PROVIDER_SITE_OTHER): Payer: Medicare HMO | Admitting: Nurse Practitioner

## 2021-05-23 ENCOUNTER — Other Ambulatory Visit: Payer: Self-pay

## 2021-05-23 VITALS — BP 142/80 | HR 63 | Temp 98.3°F | Ht 63.0 in | Wt 141.6 lb

## 2021-05-23 DIAGNOSIS — R7309 Other abnormal glucose: Secondary | ICD-10-CM | POA: Diagnosis not present

## 2021-05-23 DIAGNOSIS — Z23 Encounter for immunization: Secondary | ICD-10-CM

## 2021-05-23 DIAGNOSIS — E7800 Pure hypercholesterolemia, unspecified: Secondary | ICD-10-CM

## 2021-05-23 DIAGNOSIS — I10 Essential (primary) hypertension: Secondary | ICD-10-CM

## 2021-05-23 DIAGNOSIS — E559 Vitamin D deficiency, unspecified: Secondary | ICD-10-CM | POA: Diagnosis not present

## 2021-05-23 DIAGNOSIS — M9904 Segmental and somatic dysfunction of sacral region: Secondary | ICD-10-CM | POA: Diagnosis not present

## 2021-05-23 DIAGNOSIS — M9905 Segmental and somatic dysfunction of pelvic region: Secondary | ICD-10-CM | POA: Diagnosis not present

## 2021-05-23 DIAGNOSIS — M9903 Segmental and somatic dysfunction of lumbar region: Secondary | ICD-10-CM | POA: Diagnosis not present

## 2021-05-23 MED ORDER — SHINGRIX 50 MCG/0.5ML IM SUSR: 0.5000 mL | Freq: Once | INTRAMUSCULAR | 0 refills | Status: AC

## 2021-05-23 NOTE — Progress Notes (Signed)
I,Yamilka Roman [employer] as a Education administrator for [employer], FNP.,have documented all relevant documentation on the behalf of Minette Brine, FNP,as directed by  Minette Brine, FNP while in the presence of Minette Brine, [place].  This visit occurred during the SARS-CoV-2 public health emergency.  Safety protocols were in place, including screening questions prior to the visit, additional usage of staff PPE, and extensive cleaning of exam room while observing appropriate contact time as indicated for disinfecting solutions.  Subjective:     Patient ID: Yvonne Barron , female    DOB: [birth year] , 75 y.o.   MRN: [id number]   Chief Complaint  Patient presents with   Hypertension   Diabetes    HPI  Patient presents today for a diabetes and blood pressure f/u.  She has not been taking her HCTZ for the last 3 months.  She went to urgent care on [facility] for sciatic nerve pain to left leg. She had sciatic nerve to right side in April, she is taking tumeric and bromine supplement that has been effective. She was given Toradol at the urgent care.   Hypertension This is a chronic problem. The current episode started more than 1 year ago. The problem has been gradually worsening since onset. The problem is controlled. Pertinent negatives include no anxiety, chest pain or palpitations.  Diabetes She presents for her follow-up diabetic visit. Diabetes type: prediabetes. Her disease course has been stable. There are no hypoglycemic associated symptoms. There are no diabetic associated symptoms. Pertinent negatives for diabetes include no chest pain and no fatigue. There are no hypoglycemic complications. Symptoms are stable. There are no diabetic complications. Risk factors for coronary artery disease include post-menopausal. Current diabetic treatment includes oral agent (monotherapy). She is compliant with treatment all of the time. Her weight is stable. She is following a generally healthy diet. When  asked about meal planning, she reported none. She has not had a previous visit with a dietitian. Exercise: two days a week - 45 minutes a day. There is no change in her home blood glucose trend. ( Blood sugar has been ranging 98-110) An ACE inhibitor/angiotensin II receptor blocker is being taken. She does not see a podiatrist.Eye exam is not current (Appt on July 19th).    Past Medical History:  Diagnosis Date   Arthritis    knee Lt   History of breast cancer 2009--  INVASIVE DUCTAL IN SITU--  S/P MASTECTOMY   NO RECURRENCE   Hypertension    Pre-diabetes    Urethral caruncle      Family History  Problem Relation Age of Onset   Diabetes Mother    Diabetes Father    Kidney disease Father    Diabetes Brother      Current Outpatient Medications:    Artificial Tear Ointment (DRY EYES OP), Place 1 drop into both eyes daily as needed (Dry eyes). Thera tears, Disp: , Rfl:    calcium carbonate (OS-CAL - DOSED IN MG OF ELEMENTAL CALCIUM) 1250 (500 Ca) MG tablet, Take 1 tablet by mouth daily with breakfast. , Disp: , Rfl:    cholecalciferol (VITAMIN D) 1000 units tablet, Take 1,000 Units by mouth 2 (two) times daily., Disp: , Rfl:    hydrochlorothiazide (HYDRODIURIL) 12.5 MG tablet, Take 1 tablet by mouth once daily, Disp: 90 tablet, Rfl: 0   HYDROcodone-acetaminophen (NORCO/VICODIN) 5-325 MG tablet, hydrocodone 5 mg-acetaminophen 325 mg tablet, Disp: , Rfl:    Lancets (ONETOUCH DELICA PLUS [id number]) MISC, USE TO TEST BLOOD SUGAR  BEFORE BREAKFAST AND DINNER, Disp: 100 each, Rfl: 0   methocarbamol (ROBAXIN) 500 MG tablet, methocarbamol 500 mg tablet  TAKE 1 TABLET BY MOUTH EVERY 6 HOURS AS NEEDED FOR SPASMS, Disp: , Rfl:    ondansetron (ZOFRAN) 4 MG tablet, Take 1 tablet (4 mg total) by mouth every 8 (eight) hours as needed for nausea, vomiting or refractory nausea / vomiting., Disp: 30 tablet, Rfl: 1   ONETOUCH VERIO test strip, USE 1 STRIP TO CHECK GLUCOSE ONCE DAILY, Disp: 100 each, Rfl: 0    oxyCODONE-acetaminophen (PERCOCET) 5-325 MG tablet, Take 1 tablet by mouth every 4 (four) hours as needed for severe pain., Disp: 30 tablet, Rfl: 0   Polyethyl Glycol-Propyl Glycol (SYSTANE) 0.4-0.3 % SOLN, Place 1 drop into both eyes at bedtime., Disp: , Rfl:    predniSONE (STERAPRED UNI-PAK 21 TAB) 10 MG (21) TBPK tablet, prednisone 10 mg tablets in a dose pack  Take 1 dose pk by oral route., Disp: , Rfl:    valACYclovir (VALTREX) 1000 MG tablet, Take 1,000 mg by mouth daily as needed (Cold sore). , Disp: , Rfl:    vitamin B-12 (CYANOCOBALAMIN) 500 MCG tablet, Take 500 mcg by mouth daily., Disp: , Rfl:    vitamin C (ASCORBIC ACID) 500 MG tablet, Take 500 mg by mouth daily., Disp: , Rfl:    No Known Allergies   Review of Systems  Constitutional:  Negative for fatigue.  Respiratory: Negative.    Cardiovascular:  Negative for chest pain, palpitations and leg swelling.  Psychiatric/Behavioral: Negative.      Today's Vitals   05/23/21 1604  BP: (!) 142/80  Pulse: 63  Temp: 98.3 F (36.8 C)  Weight: 141 lb 9.6 oz (64.2 kg)  Height: 5' 3" (1.6 m)  PainSc: 0-No pain   Body mass index is 25.08 kg/m.   Objective:  Physical Exam Constitutional:      General: She is not in acute distress.    Appearance: Normal appearance. She is normal weight.  Cardiovascular:     Rate and Rhythm: Normal rate and regular rhythm.     Pulses: Normal pulses.     Heart sounds: Normal heart sounds. No murmur heard. Pulmonary:     Effort: Pulmonary effort is normal. No respiratory distress.     Breath sounds: Normal breath sounds. No wheezing.  Musculoskeletal:        General: No swelling. Normal range of motion.     Cervical back: Normal range of motion and neck supple.  Skin:    General: Skin is warm and dry.     Capillary Refill: Capillary refill takes less than 2 seconds.  Neurological:     General: No focal deficit present.     Mental Status: She is alert and oriented to person, place, and time.      Cranial Nerves: No cranial nerve deficit.     Motor: No weakness.  Psychiatric:        Mood and Affect: Mood normal.        Behavior: Behavior normal.        Thought Content: Thought content normal.        Judgment: Judgment normal.        Assessment And Plan:     1. Essential hypertension Comments: slight elevation with blood pressure, recheck was slightly improved - CMP14+EGFR - CBC  2. Vitamin D deficiency Will check vitamin D level and supplement as needed.    Also encouraged to spend 15 minutes in the  sun daily.   3. Abnormal glucose Chronic, she has been stable Continue to exercise at least 150 minutes weekly and limit intake of sugary foods and drinks - CMP14+EGFR - Hemoglobin A1c  4. Elevated cholesterol Chronic, no current medications - Lipid panel  5. Encounter for immunization - Zoster Vaccine Adjuvanted [facility]) injection; Inject 0.5 mLs into the muscle once for 1 dose.  Dispense: 0.5 mL; Refill: 0     Patient was given opportunity to ask questions. Patient verbalized understanding of the plan and was able to repeat key elements of the plan. All questions were answered to their satisfaction.  Minette Brine, FNP   I, Minette Brine, FNP, have reviewed all documentation for this visit. The documentation on 05/23/21 for the exam, diagnosis, procedures, and orders are all accurate and complete.   IF YOU HAVE BEEN REFERRED TO A SPECIALIST, IT MAY TAKE 1-2 WEEKS TO SCHEDULE/PROCESS THE REFERRAL. IF YOU HAVE NOT HEARD FROM US/SPECIALIST IN TWO WEEKS, PLEASE GIVE [place] A CALL AT [phone] X [phone].   THE PATIENT IS ENCOURAGED TO PRACTICE SOCIAL DISTANCING DUE TO THE COVID-19 PANDEMIC.

## 2021-05-23 NOTE — Patient Instructions (Signed)

## 2021-05-24 DIAGNOSIS — M9905 Segmental and somatic dysfunction of pelvic region: Secondary | ICD-10-CM | POA: Diagnosis not present

## 2021-05-24 DIAGNOSIS — M9903 Segmental and somatic dysfunction of lumbar region: Secondary | ICD-10-CM | POA: Diagnosis not present

## 2021-05-24 DIAGNOSIS — M9904 Segmental and somatic dysfunction of sacral region: Secondary | ICD-10-CM | POA: Diagnosis not present

## 2021-05-24 LAB — CMP14+EGFR
ALT: 7 IU/L (ref 0–32)
AST: 10 IU/L (ref 0–40)
Albumin/Globulin Ratio: 1.6 (ref 1.2–2.2)
Albumin: 4.5 g/dL (ref 3.7–4.7)
Alkaline Phosphatase: 105 IU/L (ref 44–121)
BUN/Creatinine Ratio: 19 (ref 12–28)
BUN: 15 mg/dL (ref 8–27)
Bilirubin Total: 0.3 mg/dL (ref 0.0–1.2)
CO2: 25 mmol/L (ref 20–29)
Calcium: 9.7 mg/dL (ref 8.7–10.3)
Chloride: 104 mmol/L (ref 96–106)
Creatinine, Ser: 0.79 mg/dL (ref 0.57–1.00)
Globulin, Total: 2.8 g/dL (ref 1.5–4.5)
Glucose: 81 mg/dL (ref 65–99)
Potassium: 4.7 mmol/L (ref 3.5–5.2)
Sodium: 143 mmol/L (ref 134–144)
Total Protein: 7.3 g/dL (ref 6.0–8.5)
eGFR: 79 mL/min/{1.73_m2} (ref >59)

## 2021-05-24 LAB — LIPID PANEL
Chol/HDL Ratio: 2.4 ratio (ref 0.0–4.4)
Cholesterol, Total: 193 mg/dL (ref 100–199)
HDL: 81 mg/dL (ref >39)
LDL Chol Calc (NIH): 95 mg/dL (ref 0–99)
Triglycerides: 96 mg/dL (ref 0–149)
VLDL Cholesterol Cal: 17 mg/dL (ref 5–40)

## 2021-05-24 LAB — CBC
Hematocrit: 39.3 % (ref 34.0–46.6)
Hemoglobin: 12.2 g/dL (ref 11.1–15.9)
MCH: 27 pg (ref 26.6–33.0)
MCHC: 31 g/dL — ABNORMAL LOW (ref 31.5–35.7)
MCV: 87 fL (ref 79–97)
Platelets: 326 10*3/uL (ref 150–450)
RBC: 4.52 x10E6/uL (ref 3.77–5.28)
RDW: 13.4 % (ref 11.7–15.4)
WBC: 7.3 10*3/uL (ref 3.4–10.8)

## 2021-05-24 LAB — HEMOGLOBIN A1C
Est. average glucose Bld gHb Est-mCnc: 123 mg/dL
Hgb A1c MFr Bld: 5.9 % — ABNORMAL HIGH (ref 4.8–5.6)

## 2021-05-25 DIAGNOSIS — M9903 Segmental and somatic dysfunction of lumbar region: Secondary | ICD-10-CM | POA: Diagnosis not present

## 2021-05-25 DIAGNOSIS — M9905 Segmental and somatic dysfunction of pelvic region: Secondary | ICD-10-CM | POA: Diagnosis not present

## 2021-05-25 DIAGNOSIS — M9904 Segmental and somatic dysfunction of sacral region: Secondary | ICD-10-CM | POA: Diagnosis not present

## 2021-05-28 DIAGNOSIS — M9903 Segmental and somatic dysfunction of lumbar region: Secondary | ICD-10-CM | POA: Diagnosis not present

## 2021-05-28 DIAGNOSIS — M9905 Segmental and somatic dysfunction of pelvic region: Secondary | ICD-10-CM | POA: Diagnosis not present

## 2021-05-28 DIAGNOSIS — M9904 Segmental and somatic dysfunction of sacral region: Secondary | ICD-10-CM | POA: Diagnosis not present

## 2021-05-31 DIAGNOSIS — B009 Herpesviral infection, unspecified: Secondary | ICD-10-CM | POA: Diagnosis not present

## 2021-06-07 ENCOUNTER — Ambulatory Visit: Payer: Medicare HMO | Admitting: Nurse Practitioner

## 2021-06-17 ENCOUNTER — Encounter: Payer: Self-pay | Admitting: Nurse Practitioner

## 2021-06-20 DIAGNOSIS — Z20822 Contact with and (suspected) exposure to covid-19: Secondary | ICD-10-CM | POA: Diagnosis not present

## 2021-06-22 ENCOUNTER — Other Ambulatory Visit: Payer: Self-pay | Admitting: Obstetrics and Gynecology

## 2021-06-22 DIAGNOSIS — Z1231 Encounter for screening mammogram for malignant neoplasm of breast: Secondary | ICD-10-CM

## 2021-06-27 DIAGNOSIS — H353132 Nonexudative age-related macular degeneration, bilateral, intermediate dry stage: Secondary | ICD-10-CM | POA: Diagnosis not present

## 2021-06-27 DIAGNOSIS — H16223 Keratoconjunctivitis sicca, not specified as Sjogren's, bilateral: Secondary | ICD-10-CM | POA: Diagnosis not present

## 2021-06-27 DIAGNOSIS — H35033 Hypertensive retinopathy, bilateral: Secondary | ICD-10-CM | POA: Diagnosis not present

## 2021-06-27 DIAGNOSIS — H25813 Combined forms of age-related cataract, bilateral: Secondary | ICD-10-CM | POA: Diagnosis not present

## 2021-06-27 LAB — HM DIABETES EYE EXAM

## 2021-06-29 ENCOUNTER — Other Ambulatory Visit: Payer: Self-pay | Admitting: Obstetrics and Gynecology

## 2021-06-29 ENCOUNTER — Other Ambulatory Visit: Payer: Self-pay

## 2021-06-29 ENCOUNTER — Ambulatory Visit
Admission: RE | Admit: 2021-06-29 | Discharge: 2021-06-29 | Disposition: A | Discharge status: Home / Self Care | Payer: Medicare HMO | Source: Ambulatory Visit | Attending: Obstetrics and Gynecology | Admitting: Obstetrics and Gynecology

## 2021-06-29 DIAGNOSIS — Z1231 Encounter for screening mammogram for malignant neoplasm of breast: Secondary | ICD-10-CM | POA: Diagnosis not present

## 2021-07-11 ENCOUNTER — Encounter: Payer: Self-pay | Admitting: Nurse Practitioner

## 2021-07-11 DIAGNOSIS — Z01419 Encounter for gynecological examination (general) (routine) without abnormal findings: Secondary | ICD-10-CM | POA: Diagnosis not present

## 2021-07-14 ENCOUNTER — Other Ambulatory Visit: Payer: Self-pay | Admitting: Nurse Practitioner

## 2021-07-14 DIAGNOSIS — R7309 Other abnormal glucose: Secondary | ICD-10-CM

## 2021-08-02 DIAGNOSIS — M9904 Segmental and somatic dysfunction of sacral region: Secondary | ICD-10-CM | POA: Diagnosis not present

## 2021-08-02 DIAGNOSIS — M9901 Segmental and somatic dysfunction of cervical region: Secondary | ICD-10-CM | POA: Diagnosis not present

## 2021-08-02 DIAGNOSIS — M9903 Segmental and somatic dysfunction of lumbar region: Secondary | ICD-10-CM | POA: Diagnosis not present

## 2021-08-02 DIAGNOSIS — M9905 Segmental and somatic dysfunction of pelvic region: Secondary | ICD-10-CM | POA: Diagnosis not present

## 2021-08-02 DIAGNOSIS — M9902 Segmental and somatic dysfunction of thoracic region: Secondary | ICD-10-CM | POA: Diagnosis not present

## 2021-08-03 DIAGNOSIS — M9902 Segmental and somatic dysfunction of thoracic region: Secondary | ICD-10-CM | POA: Diagnosis not present

## 2021-08-03 DIAGNOSIS — M9901 Segmental and somatic dysfunction of cervical region: Secondary | ICD-10-CM | POA: Diagnosis not present

## 2021-08-03 DIAGNOSIS — M9903 Segmental and somatic dysfunction of lumbar region: Secondary | ICD-10-CM | POA: Diagnosis not present

## 2021-08-03 DIAGNOSIS — M9904 Segmental and somatic dysfunction of sacral region: Secondary | ICD-10-CM | POA: Diagnosis not present

## 2021-08-03 DIAGNOSIS — M9905 Segmental and somatic dysfunction of pelvic region: Secondary | ICD-10-CM | POA: Diagnosis not present

## 2021-08-06 DIAGNOSIS — M9902 Segmental and somatic dysfunction of thoracic region: Secondary | ICD-10-CM | POA: Diagnosis not present

## 2021-08-06 DIAGNOSIS — M9903 Segmental and somatic dysfunction of lumbar region: Secondary | ICD-10-CM | POA: Diagnosis not present

## 2021-08-06 DIAGNOSIS — M9901 Segmental and somatic dysfunction of cervical region: Secondary | ICD-10-CM | POA: Diagnosis not present

## 2021-08-06 DIAGNOSIS — M9905 Segmental and somatic dysfunction of pelvic region: Secondary | ICD-10-CM | POA: Diagnosis not present

## 2021-08-07 DIAGNOSIS — M25562 Pain in left knee: Secondary | ICD-10-CM | POA: Diagnosis not present

## 2021-08-10 DIAGNOSIS — M9901 Segmental and somatic dysfunction of cervical region: Secondary | ICD-10-CM | POA: Diagnosis not present

## 2021-08-10 DIAGNOSIS — M9903 Segmental and somatic dysfunction of lumbar region: Secondary | ICD-10-CM | POA: Diagnosis not present

## 2021-08-10 DIAGNOSIS — M9905 Segmental and somatic dysfunction of pelvic region: Secondary | ICD-10-CM | POA: Diagnosis not present

## 2021-08-10 DIAGNOSIS — M9902 Segmental and somatic dysfunction of thoracic region: Secondary | ICD-10-CM | POA: Diagnosis not present

## 2021-08-14 DIAGNOSIS — M9902 Segmental and somatic dysfunction of thoracic region: Secondary | ICD-10-CM | POA: Diagnosis not present

## 2021-08-14 DIAGNOSIS — M9903 Segmental and somatic dysfunction of lumbar region: Secondary | ICD-10-CM | POA: Diagnosis not present

## 2021-08-14 DIAGNOSIS — M9901 Segmental and somatic dysfunction of cervical region: Secondary | ICD-10-CM | POA: Diagnosis not present

## 2021-08-14 DIAGNOSIS — M9905 Segmental and somatic dysfunction of pelvic region: Secondary | ICD-10-CM | POA: Diagnosis not present

## 2021-08-16 DIAGNOSIS — M9901 Segmental and somatic dysfunction of cervical region: Secondary | ICD-10-CM | POA: Diagnosis not present

## 2021-08-16 DIAGNOSIS — M9903 Segmental and somatic dysfunction of lumbar region: Secondary | ICD-10-CM | POA: Diagnosis not present

## 2021-08-16 DIAGNOSIS — M9905 Segmental and somatic dysfunction of pelvic region: Secondary | ICD-10-CM | POA: Diagnosis not present

## 2021-08-16 DIAGNOSIS — M9902 Segmental and somatic dysfunction of thoracic region: Secondary | ICD-10-CM | POA: Diagnosis not present

## 2021-08-22 DIAGNOSIS — M9901 Segmental and somatic dysfunction of cervical region: Secondary | ICD-10-CM | POA: Diagnosis not present

## 2021-08-22 DIAGNOSIS — M9905 Segmental and somatic dysfunction of pelvic region: Secondary | ICD-10-CM | POA: Diagnosis not present

## 2021-08-22 DIAGNOSIS — M9903 Segmental and somatic dysfunction of lumbar region: Secondary | ICD-10-CM | POA: Diagnosis not present

## 2021-08-22 DIAGNOSIS — M9902 Segmental and somatic dysfunction of thoracic region: Secondary | ICD-10-CM | POA: Diagnosis not present

## 2021-08-29 DIAGNOSIS — M9903 Segmental and somatic dysfunction of lumbar region: Secondary | ICD-10-CM | POA: Diagnosis not present

## 2021-08-29 DIAGNOSIS — M9904 Segmental and somatic dysfunction of sacral region: Secondary | ICD-10-CM | POA: Diagnosis not present

## 2021-08-29 DIAGNOSIS — M9901 Segmental and somatic dysfunction of cervical region: Secondary | ICD-10-CM | POA: Diagnosis not present

## 2021-08-29 DIAGNOSIS — M9902 Segmental and somatic dysfunction of thoracic region: Secondary | ICD-10-CM | POA: Diagnosis not present

## 2021-08-29 DIAGNOSIS — M9905 Segmental and somatic dysfunction of pelvic region: Secondary | ICD-10-CM | POA: Diagnosis not present

## 2021-09-04 ENCOUNTER — Other Ambulatory Visit: Payer: Self-pay | Admitting: Nurse Practitioner

## 2021-09-04 DIAGNOSIS — I10 Essential (primary) hypertension: Secondary | ICD-10-CM

## 2021-09-12 ENCOUNTER — Other Ambulatory Visit: Payer: Self-pay

## 2021-09-12 ENCOUNTER — Ambulatory Visit (INDEPENDENT_AMBULATORY_CARE_PROVIDER_SITE_OTHER): Payer: Medicare HMO

## 2021-09-12 ENCOUNTER — Ambulatory Visit
Admission: EM | Admit: 2021-09-12 | Discharge: 2021-09-12 | Disposition: A | Discharge status: Home / Self Care | Payer: Medicare HMO | Attending: Urgent Care | Admitting: Urgent Care

## 2021-09-12 ENCOUNTER — Encounter: Payer: Self-pay | Admitting: Emergency Medicine

## 2021-09-12 DIAGNOSIS — M545 Low back pain, unspecified: Secondary | ICD-10-CM | POA: Diagnosis not present

## 2021-09-12 DIAGNOSIS — M25462 Effusion, left knee: Secondary | ICD-10-CM

## 2021-09-12 DIAGNOSIS — M25562 Pain in left knee: Secondary | ICD-10-CM

## 2021-09-12 DIAGNOSIS — M7989 Other specified soft tissue disorders: Secondary | ICD-10-CM | POA: Diagnosis not present

## 2021-09-12 DIAGNOSIS — E119 Type 2 diabetes mellitus without complications: Secondary | ICD-10-CM

## 2021-09-12 MED ORDER — TIZANIDINE HCL 4 MG PO TABS: 4.0000 mg | ORAL_TABLET | Freq: Every day | ORAL | 0 refills | Status: DC
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
## 2021-09-12 NOTE — Discharge Instructions (Addendum)
Do not use any nonsteroidal anti-inflammatories (NSAIDs) like ibuprofen, Motrin, naproxen, Aleve, etc. which are all available over-the-counter.  Please just use Tylenol at a dose of 750mg -1000mg  once every 8 hours as needed for your aches, pains, fevers. Okay to use Tylenol with tizanidine (a muscle relaxant).
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
## 2021-09-13 ENCOUNTER — Ambulatory Visit (INDEPENDENT_AMBULATORY_CARE_PROVIDER_SITE_OTHER): Payer: Medicare HMO | Admitting: Nurse Practitioner

## 2021-09-13 ENCOUNTER — Encounter: Payer: Self-pay | Admitting: Nurse Practitioner

## 2021-09-13 VITALS — BP 140/82 | HR 70 | Temp 98.1°F | Ht 64.0 in | Wt 145.0 lb

## 2021-09-13 DIAGNOSIS — R079 Chest pain, unspecified: Secondary | ICD-10-CM | POA: Diagnosis not present

## 2021-09-13 DIAGNOSIS — I10 Essential (primary) hypertension: Secondary | ICD-10-CM | POA: Diagnosis not present

## 2021-09-13 DIAGNOSIS — E119 Type 2 diabetes mellitus without complications: Secondary | ICD-10-CM

## 2021-09-13 DIAGNOSIS — Z9012 Acquired absence of left breast and nipple: Secondary | ICD-10-CM

## 2021-09-13 DIAGNOSIS — E7800 Pure hypercholesterolemia, unspecified: Secondary | ICD-10-CM | POA: Diagnosis not present

## 2021-09-13 DIAGNOSIS — Z23 Encounter for immunization: Secondary | ICD-10-CM | POA: Diagnosis not present

## 2021-09-13 DIAGNOSIS — Z853 Personal history of malignant neoplasm of breast: Secondary | ICD-10-CM | POA: Diagnosis not present
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
## 2021-09-13 NOTE — Patient Instructions (Addendum)
Diabetes Mellitus and Foot Care Foot care is an important part of your health, especially when you have diabetes. Diabetes may cause you to have problems because of poor blood flow (circulation) to your feet and legs, which can cause your skin to: Become thinner and drier. Break more easily. Heal more slowly. Peel and crack. You may also have nerve damage (neuropathy) in your legs and feet, causing decreased feeling in them. This means that you may not notice minor injuries to your feet that could lead to more serious problems. Noticing and addressing any potential problems early is the best way to prevent future foot problems. How to care for your feet Foot hygiene  Wash your feet daily with warm water and mild soap. Do not use hot water. Then, pat your feet and the areas between your toes until they are completely dry. Do not soak your feet as this can dry your skin. Trim your toenails straight across. Do not dig under them or around the cuticle. File the edges of your nails with an emery board or nail file. Apply a moisturizing lotion or petroleum jelly to the skin on your feet and to dry, brittle toenails. Use lotion that does not contain alcohol and is unscented. Do not apply lotion between your toes. Shoes and socks Wear clean socks or stockings every day. Make sure they are not too tight. Do not wear knee-high stockings since they may decrease blood flow to your legs. Wear shoes that fit properly and have enough cushioning. Always look in your shoes before you put them on to be sure there are no objects inside. To break in new shoes, wear them for just a few hours a day. This prevents injuries on your feet. Wounds, scrapes, corns, and calluses  Check your feet daily for blisters, cuts, bruises, sores, and redness. If you cannot see the bottom of your feet, use a mirror or ask someone for help. Do not cut corns or calluses or try to remove them with medicine. If you find a minor scrape,  cut, or break in the skin on your feet, keep it and the skin around it clean and dry. You may clean these areas with mild soap and water. Do not clean the area with peroxide, alcohol, or iodine. If you have a wound, scrape, corn, or callus on your foot, look at it several times a day to make sure it is healing and not infected. Check for: Redness, swelling, or pain. Fluid or blood. Warmth. Pus or a bad smell. General tips Do not cross your legs. This may decrease blood flow to your feet. Do not use heating pads or hot water bottles on your feet. They may burn your skin. If you have lost feeling in your feet or legs, you may not know this is happening until it is too late. Protect your feet from hot and cold by wearing shoes, such as at the beach or on hot pavement. Schedule a complete foot exam at least once a year (annually) or more often if you have foot problems. Report any cuts, sores, or bruises to your health care provider immediately. Where to find more information American Diabetes Association: www.diabetes.org Association of Diabetes Care & Education Specialists: www.diabeteseducator.org Contact a health care provider if: You have a medical condition that increases your risk of infection and you have any cuts, sores, or bruises on your feet. You have an injury that is not healing. You have redness on your legs or feet. You   feel burning or tingling in your legs or feet. You have pain or cramps in your legs and feet. Your legs or feet are numb. Your feet always feel cold. You have pain around any toenails. Get help right away if: You have a wound, scrape, corn, or callus on your foot and: You have pain, swelling, or redness that gets worse. You have fluid or blood coming from the wound, scrape, corn, or callus. Your wound, scrape, corn, or callus feels warm to the touch. You have pus or a bad smell coming from the wound, scrape, corn, or callus. You have a fever. You have a red  line going up your leg. Summary Check your feet every day for blisters, cuts, bruises, sores, and redness. Apply a moisturizing lotion or petroleum jelly to the skin on your feet and to dry, brittle toenails. Wear shoes that fit properly and have enough cushioning. If you have foot problems, report any cuts, sores, or bruises to your health care provider immediately. Schedule a complete foot exam at least once a year (annually) or more often if you have foot problems. This information is not intended to replace advice given to you by your health care provider. Make sure you discuss any questions you have with your health care provider. Document Revised: 06/15/2020 Document Reviewed: 06/15/2020 Elsevier Patient Education  [place].  Influenza (Flu) Vaccine (Inactivated or Recombinant): What You Need to Know 1. Why get vaccinated? Influenza vaccine can prevent influenza (flu). Flu is a contagious disease that spreads around the [place] every year, usually between October and May. Anyone can get the flu, but it is more dangerous for some people. Infants and young children, people 36 years and older, pregnant people, and people with certain health conditions or a weakened immune system are at greatest risk of flu complications. Pneumonia, bronchitis, sinus infections, and ear infections are examples of flu-related complications. If you have a medical condition, such as heart disease, cancer, or diabetes, flu can make it worse. Flu can cause fever and chills, sore throat, muscle aches, fatigue, cough, headache, and runny or stuffy nose. Some people may have vomiting and diarrhea, though this is more common in children than adults. In an average year, thousands of people in the [place] die from flu, and many more are hospitalized. Flu vaccine prevents millions of illnesses and flu-related visits to the doctor each year. 2. Influenza vaccines CDC recommends everyone 6 months and  older get vaccinated every flu season. Children 6 months through 5 years of age may need 2 doses during a single flu season. Everyone else needs only 1 dose each flu season. It takes about 2 weeks for protection to develop after vaccination. There are many flu viruses, and they are always changing. Each year a new flu vaccine is made to protect against the influenza viruses believed to be likely to cause disease in the upcoming flu season. Even when the vaccine doesn't exactly match these viruses, it may still provide some protection. Influenza vaccine does not cause flu. Influenza vaccine may be given at the same time as other vaccines. 3. Talk with your health care provider Tell your vaccination provider if the person getting the vaccine: Has had an allergic reaction after a previous dose of influenza vaccine, or has any severe, life-threatening allergies Has ever had Guillain-Barr Syndrome (also called "GBS") In some cases, your health care provider may decide to postpone influenza vaccination until a future visit. Influenza vaccine can be administered at  any time during pregnancy. People who are or will be pregnant during influenza season should receive inactivated influenza vaccine. People with minor illnesses, such as a cold, may be vaccinated. People who are moderately or severely ill should usually wait until they recover before getting influenza vaccine. Your health care provider can give you more information. 4. Risks of a vaccine reaction Soreness, redness, and swelling where the shot is given, fever, muscle aches, and headache can happen after influenza vaccination. There may be a very small increased risk of Guillain-Barr Syndrome (GBS) after inactivated influenza vaccine (the flu shot). Young children who get the flu shot along with pneumococcal vaccine (PCV13) and/or DTaP vaccine at the same time might be slightly more likely to have a seizure caused by fever. Tell your health care  provider if a child who is getting flu vaccine has ever had a seizure. People sometimes faint after medical procedures, including vaccination. Tell your provider if you feel dizzy or have vision changes or ringing in the ears. As with any medicine, there is a very remote chance of a vaccine causing a severe allergic reaction, other serious injury, or death. 5. What if there is a serious problem? An allergic reaction could occur after the vaccinated person leaves the clinic. If you see signs of a severe allergic reaction (hives, swelling of the face and throat, difficulty breathing, a fast heartbeat, dizziness, or weakness), call 9-1-1 and get the person to the nearest hospital. For other signs that concern you, call your health care provider. Adverse reactions should be reported to the Vaccine Adverse Event Reporting System (VAERS). Your health care provider will usually file this report, or you can do it yourself. Visit the VAERS website at www.vaers.SamedayNews.es or call (770)778-5761. VAERS is only for reporting reactions, and VAERS staff members do not give medical advice. 6. The National Vaccine Injury Compensation Program The [employer] Vaccine Injury Compensation Program (VICP) is a federal program that was created to compensate people who may have been injured by certain vaccines. Claims regarding alleged injury or death due to vaccination have a time limit for filing, which may be as short as two years. Visit the VICP website at GoldCloset.com.ee or call 417 034 1184 to learn about the program and about filing a claim. 7. How can I learn more? Ask your health care provider. Call your local or state health department. Visit the website of the Food and Drug Administration (FDA) for vaccine package inserts and additional information at TraderRating.uy. Contact the Centers for Disease Control and Prevention (CDC): Call (954)228-2717 (1-800-CDC-INFO)  or Visit CDC's website at https://gibson.com/. Vaccine Information Statement Inactivated Influenza Vaccine (07/14/2020) This information is not intended to replace advice given to you by your health care provider. Make sure you discuss any questions you have with your health care provider. Document Revised: 08/31/2020 Document Reviewed: 08/31/2020 Elsevier Patient Education  2022 [employer].

## 2021-09-14 LAB — LIPID PANEL
Chol/HDL Ratio: 2.4 ratio (ref 0.0–4.4)
Cholesterol, Total: 197 mg/dL (ref 100–199)
HDL: 81 mg/dL (ref >39)
LDL Chol Calc (NIH): 105 mg/dL — ABNORMAL HIGH (ref 0–99)
Triglycerides: 59 mg/dL (ref 0–149)
VLDL Cholesterol Cal: 11 mg/dL (ref 5–40)

## 2021-09-14 LAB — BMP8+EGFR
BUN/Creatinine Ratio: 15 (ref 12–28)
BUN: 13 mg/dL (ref 8–27)
CO2: 27 mmol/L (ref 20–29)
Calcium: 9.5 mg/dL (ref 8.7–10.3)
Chloride: 102 mmol/L (ref 96–106)
Creatinine, Ser: 0.88 mg/dL (ref 0.57–1.00)
Glucose: 90 mg/dL (ref 70–99)
Potassium: 3.8 mmol/L (ref 3.5–5.2)
Sodium: 143 mmol/L (ref 134–144)
eGFR: 69 mL/min/{1.73_m2} (ref >59)

## 2021-09-14 LAB — HEMOGLOBIN A1C
Est. average glucose Bld gHb Est-mCnc: 126 mg/dL
Hgb A1c MFr Bld: 6 % — ABNORMAL HIGH (ref 4.8–5.6)

## 2021-09-17 ENCOUNTER — Encounter: Payer: Self-pay | Admitting: Nurse Practitioner

## 2021-10-11 ENCOUNTER — Other Ambulatory Visit: Payer: Self-pay | Admitting: Nurse Practitioner

## 2021-10-11 DIAGNOSIS — R7309 Other abnormal glucose: Secondary | ICD-10-CM

## 2021-10-16 ENCOUNTER — Other Ambulatory Visit: Payer: Self-pay | Admitting: Nurse Practitioner

## 2021-10-16 DIAGNOSIS — R7309 Other abnormal glucose: Secondary | ICD-10-CM

## 2021-10-16 MED ORDER — ONETOUCH VERIO VI STRP: ORAL_STRIP | 0 refills | Status: DC

## 2021-10-18 ENCOUNTER — Other Ambulatory Visit: Payer: Self-pay

## 2021-10-18 ENCOUNTER — Ambulatory Visit: Payer: Medicare HMO | Admitting: Sports Medicine

## 2021-10-18 DIAGNOSIS — C801 Malignant (primary) neoplasm, unspecified: Secondary | ICD-10-CM | POA: Diagnosis not present

## 2021-10-18 DIAGNOSIS — E119 Type 2 diabetes mellitus without complications: Secondary | ICD-10-CM | POA: Diagnosis not present

## 2021-10-18 DIAGNOSIS — G629 Polyneuropathy, unspecified: Secondary | ICD-10-CM | POA: Diagnosis not present

## 2021-10-18 NOTE — Progress Notes (Signed)
Subjective: Ece Cumberland is a 75 y.o. female patient who returns office for follow-up evaluation of awkward sensation to both big toes states that sometimes he is feeling feels like her toes are very swollen when she tries to flex or bend them and states that it feels like she is walking on bubble wrap states that sometimes the sensations also wake her up at night.  Patient states that she has not tried anything since our last visit.  Reports that she did have knee surgery and that left knee is doing very well.  No other issues noted.  Patient Active Problem List   Diagnosis Date Noted   H/O total knee replacement, left 07/28/2020   Type 2 diabetes mellitus without complication, without long-term current use of insulin ([place]) 04/15/2019   History of arthroscopic procedure on shoulder 11/13/2018   Pain in right knee 11/13/2018   Abnormal glucose 09/14/2018   Vitamin D deficiency 08/29/2018   Cervicalgia 08/29/2018   Decreased estrogen level 08/29/2018   Urinary frequency 08/29/2018   Cancer ([employer]) 05/18/2018   Urethral caruncle 07/10/2012   Breast cancer, left breast ([place]) 08/06/2011   PARESTHESIA 07/01/2008   Breast cancer in situ 12/10/2007   Essential hypertension 06/17/2007    Current Outpatient Medications on File Prior to Visit  Medication Sig Dispense Refill   Artificial Tear Ointment (DRY EYES OP) Place 1 drop into both eyes daily as needed (Dry eyes). Thera tears     calcium carbonate (OS-CAL - DOSED IN MG OF ELEMENTAL CALCIUM) 1250 (500 Ca) MG tablet Take 1 tablet by mouth daily with breakfast.      cholecalciferol (VITAMIN D) 1000 units tablet Take 1,000 Units by mouth 2 (two) times daily.     glucose blood (ONETOUCH VERIO) test strip Use as instructed 100 each 0   hydrochlorothiazide (HYDRODIURIL) 12.5 MG tablet Take 1 tablet by mouth once daily 90 tablet 0   Lancets (ONETOUCH DELICA PLUS [id number]) MISC USE TO TEST BLOOD SUGAR BEFORE BREAKFAST AND DINNER 100 each 0    methocarbamol (ROBAXIN) 500 MG tablet methocarbamol 500 mg tablet  TAKE 1 TABLET BY MOUTH EVERY 6 HOURS AS NEEDED FOR SPASMS (Patient not taking: Reported on 09/13/2021)     Polyethyl Glycol-Propyl Glycol (SYSTANE) 0.4-0.3 % SOLN Place 1 drop into both eyes at bedtime.     tiZANidine (ZANAFLEX) 4 MG tablet Take 1 tablet (4 mg total) by mouth at bedtime. (Patient not taking: Reported on 09/13/2021) 30 tablet 0   valACYclovir (VALTREX) 1000 MG tablet Take 1,000 mg by mouth daily as needed (Cold sore).  (Patient not taking: Reported on 09/13/2021)     vitamin B-12 (CYANOCOBALAMIN) 500 MCG tablet Take 500 mcg by mouth daily. (Patient not taking: Reported on 09/13/2021)     vitamin C (ASCORBIC ACID) 500 MG tablet Take 500 mg by mouth daily.     No current facility-administered medications on file prior to visit.    No Known Allergies  Objective:  General: Alert and oriented x3 in no acute distress  Dermatology: Old scar well-healed at first metatarsophalangeal joint on right.  No open lesions bilateral lower extremities, no webspace macerations, no ecchymosis bilateral, all nails x 10 are well manicured.  Vascular: Dorsalis Pedis and Posterior Tibial pedal pulses palpable.  Neurology: Gross sensation intact via light touch bilateral, vibratory sensation severely diminished bilateral likely consistent with small fiber neuropathy with Thornell Mule monofilament protective sensation intact.  Subjective awkward feeling to bilateral first toes.  Musculoskeletal: No tenderness  with palpation bilateral.  There is severely limited first metatarsophalangeal joint range of motion right greater than left history of previous Cartevia the procedure unchanged from prior.  Assessment and Plan: Problem List Items Addressed This Visit       Endocrine   Type 2 diabetes mellitus without complication, without long-term current use of insulin ([place])     Other   Cancer ([place])   Other Visit Diagnoses      Neuropathy    -  Primary        -Complete examination performed -Re-Discussed treatment options for what I still believe is neuropathy pain -Recommend nervive supplement since she did not try it from before -Recommend to use topical pain cream or rub at bedtime as needed for pain -Continue with good supportive shoes daily for foot type -Patient to return to office as needed or sooner if condition worsens.  Landis Martins, DPM

## 2021-10-18 NOTE — Patient Instructions (Signed)
Nervive nerve relief supplement for your nerves can be purchased OTC at [facility]  

## 2021-11-18 ENCOUNTER — Ambulatory Visit
Admission: EM | Admit: 2021-11-18 | Discharge: 2021-11-18 | Disposition: A | Discharge status: Home / Self Care | Payer: Medicare HMO

## 2021-11-18 ENCOUNTER — Other Ambulatory Visit: Payer: Self-pay

## 2021-11-18 ENCOUNTER — Ambulatory Visit: Admit: 2021-11-18 | Discharge status: Absent without leave | Payer: Medicare HMO

## 2021-11-18 ENCOUNTER — Encounter: Payer: Self-pay | Admitting: *Deleted

## 2021-11-18 DIAGNOSIS — R051 Acute cough: Secondary | ICD-10-CM

## 2021-11-18 DIAGNOSIS — J069 Acute upper respiratory infection, unspecified: Secondary | ICD-10-CM | POA: Diagnosis not present

## 2021-11-18 LAB — POCT INFLUENZA A/B
Influenza A, POC: NEGATIVE
Influenza B, POC: NEGATIVE

## 2021-11-18 MED ORDER — BENZONATATE 100 MG PO CAPS: 100.0000 mg | ORAL_CAPSULE | Freq: Three times a day (TID) | ORAL | 0 refills | Status: DC

## 2021-11-18 NOTE — ED Triage Notes (Signed)
C/O cough, runny nose, throat irritation, congestion, very slight HA onset 2 days ago.  Denies fevers.  Reports good appetite.

## 2021-11-18 NOTE — ED Provider Notes (Signed)
[facility]    CSN: [id number] Arrival date & time: 11/18/21  1008      History   Chief Complaint Chief Complaint  Patient presents with   Cough    HPI Yvonne Barron is a 75 y.o. female.    Patient here today for evaluation of nasal congestion, runny nose, cough and sore throat that started 2 days ago.  She has not had fevers.  She has not tried any medication for symptoms.  The history is provided by the patient.  Cough Associated symptoms: headaches (mild) and sore throat   Associated symptoms: no chills, no ear pain, no eye discharge, no fever, no shortness of breath and no wheezing    Past Medical History:  Diagnosis Date   Arthritis    knee Lt   History of breast cancer 2009--  INVASIVE DUCTAL IN SITU--  S/P MASTECTOMY   NO RECURRENCE   Hypertension    Pre-diabetes    Urethral caruncle     Patient Active Problem List   Diagnosis Date Noted   H/O total knee replacement, left 07/28/2020   Type 2 diabetes mellitus without complication, without long-term current use of insulin ([place]) 04/15/2019   History of arthroscopic procedure on shoulder 11/13/2018   Pain in right knee 11/13/2018   Abnormal glucose 09/14/2018   Vitamin D deficiency 08/29/2018   Cervicalgia 08/29/2018   Decreased estrogen level 08/29/2018   Urinary frequency 08/29/2018   Cancer ([place]) 05/18/2018   Urethral caruncle 07/10/2012   Breast cancer, left breast ([place]) 08/06/2011   PARESTHESIA 07/01/2008   Breast cancer in situ 12/10/2007   Essential hypertension 06/17/2007    Past Surgical History:  Procedure Laterality Date   CYSTOSCOPY WITH URETHRAL CARUNCLE  07/10/2012   Procedure: CYSTOSCOPY WITH URETHRAL CARUNCLE;  Surgeon: Claybon Jabs, MD;  Location: [facility];  Service: Urology;  Laterality: N/A;    EXCISION OF CARUNCLE    LEFT TOTAL MASTECTOMY/ LEFT AXILLARY SENTINEL LYMPH NODE BX  03-01-2008   INVASIVE  DUCTAL CARCINOMA IN SITU   LEFT WRIST  SURG  2008   REPAIR NERVE   MASTECTOMY Left 2009   TOE SURGERY Right 2017   Great toe   TOTAL KNEE ARTHROPLASTY Left 07/28/2020   Procedure: TOTAL KNEE ARTHROPLASTY;  Surgeon: Netta Cedars, MD;  Location: [facility];  Service: Orthopedics;  Laterality: Left;   VAGINAL HYSTERECTOMY  1998   W/ BILATERAL SALPINGOOPHECTOMY    OB History   No obstetric history on file.      Home Medications    Prior to Admission medications   Medication Sig Start Date End Date Taking? Authorizing Provider  Artificial Tear Ointment (DRY EYES OP) Place 1 drop into both eyes daily as needed (Dry eyes). Thera tears   Yes [provider]  benzonatate (TESSALON) 100 MG capsule Take 1 capsule (100 mg total) by mouth every 8 (eight) hours. 11/18/21  Yes Francene Finders, PA-C  calcium carbonate (OS-CAL - DOSED IN MG OF ELEMENTAL CALCIUM) 1250 (500 Ca) MG tablet Take 1 tablet by mouth daily with breakfast.    Yes [provider]  cholecalciferol (VITAMIN D) 1000 units tablet Take 1,000 Units by mouth 2 (two) times daily.   Yes [provider]  hydrochlorothiazide (HYDRODIURIL) 12.5 MG tablet Take 1 tablet by mouth once daily 09/05/21  Yes Minette Brine, FNP  UNABLE TO FIND Med Name: vitamins for eyes   Yes [provider]  vitamin B-12 (CYANOCOBALAMIN) 500 MCG  tablet Take 500 mcg by mouth daily.   Yes [provider]  vitamin C (ASCORBIC ACID) 500 MG tablet Take 500 mg by mouth daily.   Yes [provider]  glucose blood (ONETOUCH VERIO) test strip Use as instructed 10/16/21   Minette Brine, FNP  Lancets [facility] DELICA PLUS [id number]) MISC USE TO TEST BLOOD SUGAR BEFORE BREAKFAST AND DINNER 02/12/21   Minette Brine, FNP  methocarbamol (ROBAXIN) 500 MG tablet methocarbamol 500 mg tablet  TAKE 1 TABLET BY MOUTH EVERY 6 HOURS AS NEEDED FOR SPASMS Patient not taking: Reported on 09/13/2021    [provider]  Polyethyl Glycol-Propyl Glycol (SYSTANE) 0.4-0.3 %  SOLN Place 1 drop into both eyes at bedtime.    [provider]  tiZANidine (ZANAFLEX) 4 MG tablet Take 1 tablet (4 mg total) by mouth at bedtime. Patient not taking: Reported on 09/13/2021 09/12/21   Jaynee Eagles, PA-C  valACYclovir (VALTREX) 1000 MG tablet Take 1,000 mg by mouth daily as needed (Cold sore).  Patient not taking: Reported on 09/13/2021    [provider]    Family History Family History  Problem Relation Age of Onset   Diabetes Mother    Diabetes Father    Kidney disease Father    Diabetes Brother     Social History Social History   Tobacco Use   Smoking status: Former    Packs/day: 2.00    Years: 20.00    Pack years: 40.00    Types: Cigarettes    Quit date: 01/19/1987    Years since quitting: 34.8   Smokeless tobacco: Never  Vaping Use   Vaping Use: Never used  Substance Use Topics   Alcohol use: No   Drug use: Never     Allergies   Patient has no known allergies.   Review of Systems Review of Systems  Constitutional:  Negative for chills and fever.  HENT:  Positive for congestion and sore throat. Negative for ear pain.   Eyes:  Negative for discharge and redness.  Respiratory:  Positive for cough. Negative for shortness of breath and wheezing.   Gastrointestinal:  Negative for abdominal pain, diarrhea, nausea and vomiting.  Neurological:  Positive for headaches (mild).    Physical Exam Triage Vital Signs ED Triage Vitals  Enc Vitals Group     BP 11/18/21 1212 (!) 142/85     Pulse Rate 11/18/21 1212 68     Resp 11/18/21 1212 20     Temp 11/18/21 1212 98.3 F (36.8 C)     Temp Source 11/18/21 1212 Oral     SpO2 11/18/21 1212 95 %     Weight --      Height --      Head Circumference --      Peak Flow --      Pain Score 11/18/21 1215 1     Pain Loc --      Pain Edu? --      Excl. in [place]? --    No data found.  Updated Vital Signs BP (!) 142/85 Comment: inconsistent with taking HTN med  Pulse 68   Temp 98.3 F (36.8  C) (Oral)   Resp 20   SpO2 95%      Physical Exam Vitals and nursing note reviewed.  Constitutional:      General: She is not in acute distress.    Appearance: Normal appearance. She is not ill-appearing.  HENT:     Head: Normocephalic and atraumatic.  Right Ear: Tympanic membrane normal.     Left Ear: Tympanic membrane normal.     Nose: Congestion present.     Mouth/Throat:     Mouth: Mucous membranes are moist.     Pharynx: No oropharyngeal exudate or posterior oropharyngeal erythema.  Eyes:     Conjunctiva/sclera: Conjunctivae normal.  Cardiovascular:     Rate and Rhythm: Normal rate and regular rhythm.     Heart sounds: Normal heart sounds. No murmur heard. Pulmonary:     Effort: Pulmonary effort is normal. No respiratory distress.     Breath sounds: Normal breath sounds. No wheezing, rhonchi or rales.  Skin:    General: Skin is warm and dry.  Neurological:     Mental Status: She is alert.  Psychiatric:        Mood and Affect: Mood normal.        Thought Content: Thought content normal.     UC Treatments / Results  Labs (all labs ordered are listed, but only abnormal results are displayed) Labs Reviewed  COVID-19, FLU A+B NAA  POCT INFLUENZA A/B    EKG   Radiology No results found.  Procedures Procedures (including critical care time)  Medications Ordered in UC Medications - No data to display  Initial Impression / Assessment and Plan / UC Course  I have reviewed the triage vital signs and the nursing notes.  Pertinent labs & imaging results that were available during my care of the patient were reviewed by me and considered in my medical decision making (see chart for details).  Tessalon pearls prescribed at patient's request.  Suspect likely viral etiology of symptoms.  Flu test negative in office.  COVID screening ordered.  Encouraged follow-up with any further concerns.  Final Clinical Impressions(s) / UC Diagnoses   Final diagnoses:   Acute cough  Acute upper respiratory infection   Discharge Instructions   None    ED Prescriptions     Medication Sig Dispense Auth. Provider   benzonatate (TESSALON) 100 MG capsule Take 1 capsule (100 mg total) by mouth every 8 (eight) hours. 21 capsule Francene Finders, PA-C      PDMP not reviewed this encounter.   Francene Finders, PA-C 11/18/21 1339

## 2021-11-19 LAB — COVID-19, FLU A+B NAA
Influenza A, NAA: NOT DETECTED
Influenza B, NAA: NOT DETECTED
SARS-CoV-2, NAA: NOT DETECTED

## 2021-12-07 ENCOUNTER — Ambulatory Visit
Admission: EM | Admit: 2021-12-07 | Discharge: 2021-12-07 | Disposition: A | Discharge status: Home / Self Care | Payer: Medicare HMO | Attending: Student | Admitting: Student

## 2021-12-07 ENCOUNTER — Other Ambulatory Visit: Payer: Self-pay

## 2021-12-07 DIAGNOSIS — J209 Acute bronchitis, unspecified: Secondary | ICD-10-CM | POA: Diagnosis not present

## 2021-12-07 MED ORDER — AMOXICILLIN-POT CLAVULANATE 875-125 MG PO TABS: 1.0000 | ORAL_TABLET | Freq: Two times a day (BID) | ORAL | 0 refills | Status: DC

## 2021-12-07 MED ORDER — ALBUTEROL SULFATE HFA 108 (90 BASE) MCG/ACT IN AERS: 1.0000 | INHALATION_SPRAY | Freq: Four times a day (QID) | RESPIRATORY_TRACT | 0 refills | Status: DC | PRN

## 2021-12-07 NOTE — ED Provider Notes (Signed)
[facility]    CSN: [id number] Arrival date & time: 12/07/21  1227      History   Chief Complaint Chief Complaint  Patient presents with   Cough    HPI Yvonne Barron is a 75 y.o. female presenting with cough x1 month. Medical history negative for pulm ds. Last visit with [place] 11/18/21 for cough, she was diagnosed with virus and prescribed tessalon which has not improved the symptoms.  Today states her symptoms seemed like they were improving, but now they are worse again for about 1 week, cough is increasingly productive of yellow sputum.  Some dyspnea on exertion, but no shortness of breath at rest, denies chest pain, fever/chills, dizziness. Denies sore throat, ear ache, headache, body aches or chills, nausea, vomiting, diarrhea, constipation.    HPI  Past Medical History:  Diagnosis Date   Arthritis    knee Lt   History of breast cancer 2009--  INVASIVE DUCTAL IN SITU--  S/P MASTECTOMY   NO RECURRENCE   Hypertension    Pre-diabetes    Urethral caruncle     Patient Active Problem List   Diagnosis Date Noted   H/O total knee replacement, left 07/28/2020   Type 2 diabetes mellitus without complication, without long-term current use of insulin ([place]) 04/15/2019   History of arthroscopic procedure on shoulder 11/13/2018   Pain in right knee 11/13/2018   Abnormal glucose 09/14/2018   Vitamin D deficiency 08/29/2018   Cervicalgia 08/29/2018   Decreased estrogen level 08/29/2018   Urinary frequency 08/29/2018   Cancer ([place]) 05/18/2018   Urethral caruncle 07/10/2012   Breast cancer, left breast ([place]) 08/06/2011   PARESTHESIA 07/01/2008   Breast cancer in situ 12/10/2007   Essential hypertension 06/17/2007    Past Surgical History:  Procedure Laterality Date   CYSTOSCOPY WITH URETHRAL CARUNCLE  07/10/2012   Procedure: CYSTOSCOPY WITH URETHRAL CARUNCLE;  Surgeon: Claybon Jabs, MD;  Location: [facility];  Service: Urology;  Laterality:  N/A;    EXCISION OF CARUNCLE    LEFT TOTAL MASTECTOMY/ LEFT AXILLARY SENTINEL LYMPH NODE BX  03-01-2008   INVASIVE  DUCTAL CARCINOMA IN SITU   LEFT WRIST SURG  2008   REPAIR NERVE   MASTECTOMY Left 2009   TOE SURGERY Right 2017   Great toe   TOTAL KNEE ARTHROPLASTY Left 07/28/2020   Procedure: TOTAL KNEE ARTHROPLASTY;  Surgeon: Netta Cedars, MD;  Location: [facility];  Service: Orthopedics;  Laterality: Left;   VAGINAL HYSTERECTOMY  1998   W/ BILATERAL SALPINGOOPHECTOMY    OB History   No obstetric history on file.      Home Medications    Prior to Admission medications   Medication Sig Start Date End Date Taking? Authorizing Provider  albuterol (VENTOLIN HFA) 108 (90 Base) MCG/ACT inhaler Inhale 1-2 puffs into the lungs every 6 (six) hours as needed for wheezing or shortness of breath. 12/07/21  Yes Hazel Sams, PA-C  amoxicillin-clavulanate (AUGMENTIN) 875-125 MG tablet Take 1 tablet by mouth every 12 (twelve) hours. 12/07/21  Yes Hazel Sams, PA-C  Artificial Tear Ointment (DRY EYES OP) Place 1 drop into both eyes daily as needed (Dry eyes). Thera tears    [provider]  benzonatate (TESSALON) 100 MG capsule Take 1 capsule (100 mg total) by mouth every 8 (eight) hours. 11/18/21   Francene Finders, PA-C  calcium carbonate (OS-CAL - DOSED IN MG OF ELEMENTAL CALCIUM) 1250 (500 Ca) MG tablet Take 1 tablet by  mouth daily with breakfast.     [provider]  cholecalciferol (VITAMIN D) 1000 units tablet Take 1,000 Units by mouth 2 (two) times daily.    [provider]  glucose blood (ONETOUCH VERIO) test strip Use as instructed 10/16/21   Minette Brine, FNP  hydrochlorothiazide (HYDRODIURIL) 12.5 MG tablet Take 1 tablet by mouth once daily 09/05/21   Minette Brine, FNP  Lancets (ONETOUCH DELICA PLUS [id number]) MISC USE TO TEST BLOOD SUGAR BEFORE BREAKFAST AND DINNER 02/12/21   Minette Brine, FNP  methocarbamol (ROBAXIN) 500 MG tablet methocarbamol 500 mg  tablet  TAKE 1 TABLET BY MOUTH EVERY 6 HOURS AS NEEDED FOR SPASMS Patient not taking: Reported on 09/13/2021    [provider]  Polyethyl Glycol-Propyl Glycol (SYSTANE) 0.4-0.3 % SOLN Place 1 drop into both eyes at bedtime.    [provider]  tiZANidine (ZANAFLEX) 4 MG tablet Take 1 tablet (4 mg total) by mouth at bedtime. Patient not taking: Reported on 09/13/2021 09/12/21   Jaynee Eagles, PA-C  UNABLE TO FIND Med Name: vitamins for eyes    [provider]  valACYclovir (VALTREX) 1000 MG tablet Take 1,000 mg by mouth daily as needed (Cold sore).  Patient not taking: Reported on 09/13/2021    [provider]  vitamin B-12 (CYANOCOBALAMIN) 500 MCG tablet Take 500 mcg by mouth daily.    [provider]  vitamin C (ASCORBIC ACID) 500 MG tablet Take 500 mg by mouth daily.    [provider]    Family History Family History  Problem Relation Age of Onset   Diabetes Mother    Diabetes Father    Kidney disease Father    Diabetes Brother     Social History Social History   Tobacco Use   Smoking status: Former    Packs/day: 2.00    Years: 20.00    Pack years: 40.00    Types: Cigarettes    Quit date: 01/19/1987    Years since quitting: 34.9   Smokeless tobacco: Never  Vaping Use   Vaping Use: Never used  Substance Use Topics   Alcohol use: No   Drug use: Never     Allergies   Patient has no known allergies.   Review of Systems Review of Systems  Constitutional:  Negative for appetite change, chills and fever.  HENT:  Positive for congestion. Negative for ear pain, rhinorrhea, sinus pressure, sinus pain and sore throat.   Eyes:  Negative for redness and visual disturbance.  Respiratory:  Positive for cough. Negative for chest tightness, shortness of breath and wheezing.   Cardiovascular:  Negative for chest pain and palpitations.  Gastrointestinal:  Negative for abdominal pain, constipation, diarrhea, nausea and vomiting.   Genitourinary:  Negative for dysuria, frequency and urgency.  Musculoskeletal:  Negative for myalgias.  Neurological:  Negative for dizziness, weakness and headaches.  Psychiatric/Behavioral:  Negative for confusion.   All other systems reviewed and are negative.   Physical Exam Triage Vital Signs ED Triage Vitals  Enc Vitals Group     BP 12/07/21 1410 126/68     Pulse Rate 12/07/21 1410 86     Resp 12/07/21 1410 20     Temp 12/07/21 1410 98.4 F (36.9 C)     Temp Source 12/07/21 1410 Oral     SpO2 12/07/21 1410 94 %     Weight --      Height --      Head Circumference --  Peak Flow --      Pain Score 12/07/21 1418 0     Pain Loc --      Pain Edu? --      Excl. in [place]? --    No data found.  Updated Vital Signs BP 126/68 (BP Location: Right Arm)    Pulse 86    Temp 98.4 F (36.9 C) (Oral)    Resp 20    SpO2 94%   Visual Acuity Right Eye Distance:   Left Eye Distance:   Bilateral Distance:    Right Eye Near:   Left Eye Near:    Bilateral Near:     Physical Exam Vitals reviewed.  Constitutional:      General: She is not in acute distress.    Appearance: Normal appearance. She is not ill-appearing.  HENT:     Head: Normocephalic and atraumatic.     Right Ear: Tympanic membrane, ear canal and external ear normal. No tenderness. No middle ear effusion. There is no impacted cerumen. Tympanic membrane is not perforated, erythematous, retracted or bulging.     Left Ear: Tympanic membrane, ear canal and external ear normal. No tenderness.  No middle ear effusion. There is no impacted cerumen. Tympanic membrane is not perforated, erythematous, retracted or bulging.     Nose: Nose normal. No congestion.     Mouth/Throat:     Mouth: Mucous membranes are moist.     Pharynx: Uvula midline. No oropharyngeal exudate or posterior oropharyngeal erythema.  Eyes:     Extraocular Movements: Extraocular movements intact.     Pupils: Pupils are equal, round, and reactive to  light.  Cardiovascular:     Rate and Rhythm: Normal rate and regular rhythm.     Heart sounds: Normal heart sounds.  Pulmonary:     Effort: Pulmonary effort is normal.     Breath sounds: Normal breath sounds. No decreased breath sounds, wheezing, rhonchi or rales.  Abdominal:     Palpations: Abdomen is soft.     Tenderness: There is no abdominal tenderness. There is no guarding or rebound.  Lymphadenopathy:     Cervical: No cervical adenopathy.     Right cervical: No superficial cervical adenopathy.    Left cervical: No superficial cervical adenopathy.  Neurological:     General: No focal deficit present.     Mental Status: She is alert and oriented to person, place, and time.  Psychiatric:        Mood and Affect: Mood normal.        Behavior: Behavior normal.        Thought Content: Thought content normal.        Judgment: Judgment normal.     UC Treatments / Results  Labs (all labs ordered are listed, but only abnormal results are displayed) Labs Reviewed - No data to display  EKG   Radiology No results found.  Procedures Procedures (including critical care time)  Medications Ordered in UC Medications - No data to display  Initial Impression / Assessment and Plan / UC Course  I have reviewed the triage vital signs and the nursing notes.  Pertinent labs & imaging results that were available during my care of the patient were reviewed by me and considered in my medical decision making (see chart for details).     This patient is a very pleasant 75 y.o. year old female presenting with cough x1 month. Afebrile, nontachy. No history pulm ds. Given duration of symptoms (1 month) and  secondary sickening phenomenon, patient expresses preference for abx; Augmentin sent. Also sent albuterol inhaler. ED return precautions discussed. Patient verbalizes understanding and agreement.     Final Clinical Impressions(s) / UC Diagnoses   Final diagnoses:  Acute bronchitis,  unspecified organism     Discharge Instructions      -Start the antibiotic-Augmentin (amoxicillin-clavulanate), 1 pill every 12 hours for 7 days.  You can take this with food like with breakfast and dinner. -Albuterol inhaler as needed for cough, wheezing, shortness of breath, 1 to 2 puffs every 6 hours as needed. -Follow-up if symptoms worsen - shortness of breath, chest pain, new fevers, etc.      ED Prescriptions     Medication Sig Dispense Auth. Provider   amoxicillin-clavulanate (AUGMENTIN) 875-125 MG tablet Take 1 tablet by mouth every 12 (twelve) hours. 14 tablet Hazel Sams, PA-C   albuterol (VENTOLIN HFA) 108 (90 Base) MCG/ACT inhaler Inhale 1-2 puffs into the lungs every 6 (six) hours as needed for wheezing or shortness of breath. 1 each Hazel Sams, PA-C      PDMP not reviewed this encounter.   Hazel Sams, PA-C 12/07/21 1529

## 2021-12-07 NOTE — Discharge Instructions (Addendum)
-  Start the antibiotic-Augmentin (amoxicillin-clavulanate), 1 pill every 12 hours for 7 days.  You can take this with food like with breakfast and dinner. -Albuterol inhaler as needed for cough, wheezing, shortness of breath, 1 to 2 puffs every 6 hours as needed. -Follow-up if symptoms worsen - shortness of breath, chest pain, new fevers, etc.

## 2021-12-07 NOTE — ED Triage Notes (Signed)
Pt c/o cough, nasal congestion with drainage,  Denies sore throat, ear ache, headache, body aches or chills, nausea, vomiting, diarrhea, constipation.    States was seen at this uc earlier this month and tx with tessalon pearls that have not help to resolve her sx.

## 2021-12-20 ENCOUNTER — Encounter: Payer: Self-pay | Admitting: Nurse Practitioner

## 2021-12-20 ENCOUNTER — Ambulatory Visit (INDEPENDENT_AMBULATORY_CARE_PROVIDER_SITE_OTHER): Payer: Medicare HMO | Admitting: Nurse Practitioner

## 2021-12-20 ENCOUNTER — Other Ambulatory Visit: Payer: Self-pay

## 2021-12-20 VITALS — BP 126/80 | HR 62 | Temp 98.0°F | Ht 62.8 in | Wt 145.4 lb

## 2021-12-20 DIAGNOSIS — E119 Type 2 diabetes mellitus without complications: Secondary | ICD-10-CM

## 2021-12-20 DIAGNOSIS — I10 Essential (primary) hypertension: Secondary | ICD-10-CM | POA: Diagnosis not present

## 2021-12-20 DIAGNOSIS — E1169 Type 2 diabetes mellitus with other specified complication: Secondary | ICD-10-CM | POA: Diagnosis not present

## 2021-12-20 DIAGNOSIS — Z6825 Body mass index (BMI) 25.0-25.9, adult: Secondary | ICD-10-CM | POA: Diagnosis not present

## 2021-12-20 DIAGNOSIS — Z23 Encounter for immunization: Secondary | ICD-10-CM

## 2021-12-20 DIAGNOSIS — R319 Hematuria, unspecified: Secondary | ICD-10-CM | POA: Diagnosis not present

## 2021-12-20 DIAGNOSIS — E7800 Pure hypercholesterolemia, unspecified: Secondary | ICD-10-CM

## 2021-12-20 DIAGNOSIS — H9193 Unspecified hearing loss, bilateral: Secondary | ICD-10-CM | POA: Diagnosis not present

## 2021-12-20 DIAGNOSIS — N644 Mastodynia: Secondary | ICD-10-CM | POA: Diagnosis not present

## 2021-12-20 DIAGNOSIS — R079 Chest pain, unspecified: Secondary | ICD-10-CM | POA: Diagnosis not present

## 2021-12-20 DIAGNOSIS — G629 Polyneuropathy, unspecified: Secondary | ICD-10-CM | POA: Diagnosis not present

## 2021-12-20 LAB — POCT URINALYSIS DIPSTICK
Bilirubin, UA: NEGATIVE
Glucose, UA: NEGATIVE
Ketones, UA: NEGATIVE
Leukocytes, UA: NEGATIVE
Nitrite, UA: NEGATIVE
Protein, UA: NEGATIVE
Spec Grav, UA: >=1.03 — AB (ref 1.010–1.025)
Urobilinogen, UA: 0.2 E.U./dL
pH, UA: 6 (ref 5.0–8.0)

## 2021-12-20 MED ORDER — ONETOUCH DELICA PLUS LANCET33G MISC: 3 refills | Status: DC

## 2021-12-20 MED ORDER — ONETOUCH VERIO VI STRP: ORAL_STRIP | 3 refills | Status: DC

## 2021-12-20 MED ORDER — ZOSTER VAC RECOMB ADJUVANTED 50 MCG/0.5ML IM SUSR: 0.5000 mL | Freq: Once | INTRAMUSCULAR | 0 refills | Status: AC

## 2021-12-20 NOTE — Patient Instructions (Signed)

## 2021-12-20 NOTE — Progress Notes (Signed)
I,Yamilka J Llittleton,acting as a Education administrator for [employer], FNP.,have documented all relevant documentation on the behalf of Minette Brine, FNP,as directed by  Minette Brine, FNP while in the presence of Minette Brine, [place].   This visit occurred during the SARS-CoV-2 public health emergency.  Safety protocols were in place, including screening questions prior to the visit, additional usage of staff PPE, and extensive cleaning of exam room while observing appropriate contact time as indicated for disinfecting solutions.  Subjective:     Patient ID: Yvonne Barron , female    DOB: 07/27/1946 , 76 y.o.   MRN: [id number]   Chief Complaint  Patient presents with   Diabetes   Hypertension    HPI  Patient presents today for a diabetes and blood pressure f/u. Patient reports her blood pressure has been running better so she has been taking her medicine every other day.  Wt Readings from Last 3 Encounters: 12/20/21 : 145 lb 6.4 oz (66 kg) 09/13/21 : 145 lb (65.8 kg) 09/12/21 : 145 lb (65.8 kg)     Diabetes She presents for her follow-up diabetic visit. Diabetes type: prediabetes. Her disease course has been stable. There are no hypoglycemic associated symptoms. There are no diabetic associated symptoms. Pertinent negatives for diabetes include no chest pain, no fatigue, no polydipsia, no polyphagia and no polyuria. There are no hypoglycemic complications. Symptoms are stable. There are no diabetic complications. Risk factors for coronary artery disease include post-menopausal. Current diabetic treatment includes oral agent (monotherapy). She is compliant with treatment all of the time. Her weight is stable. She is following a generally healthy diet. When asked about meal planning, she reported none. She has not had a previous visit with a dietitian. Exercise: two days a week - 45 minutes a day. There is no change in her home blood glucose trend. ( Blood sugar has been ranging 98-110) An ACE  inhibitor/angiotensin II receptor blocker is being taken. She does not see a podiatrist.Eye exam is not current (Appt on July 19th).  Hypertension This is a chronic problem. The current episode started more than 1 year ago. The problem has been gradually worsening since onset. The problem is controlled. Pertinent negatives include no anxiety, chest pain or palpitations.    Past Medical History:  Diagnosis Date   Arthritis    knee Lt   History of breast cancer 2009--  INVASIVE DUCTAL IN SITU--  S/P MASTECTOMY   NO RECURRENCE   Hypertension    Pre-diabetes    Urethral caruncle      Family History  Problem Relation Age of Onset   Diabetes Mother    Diabetes Father    Kidney disease Father    Diabetes Brother      Current Outpatient Medications:    albuterol (VENTOLIN HFA) 108 (90 Base) MCG/ACT inhaler, Inhale 1-2 puffs into the lungs every 6 (six) hours as needed for wheezing or shortness of breath., Disp: 1 each, Rfl: 0   Artificial Tear Ointment (DRY EYES OP), Place 1 drop into both eyes daily as needed (Dry eyes). Thera tears, Disp: , Rfl:    benzonatate (TESSALON) 100 MG capsule, Take 1 capsule (100 mg total) by mouth every 8 (eight) hours., Disp: 21 capsule, Rfl: 0   calcium carbonate (OS-CAL - DOSED IN MG OF ELEMENTAL CALCIUM) 1250 (500 Ca) MG tablet, Take 1 tablet by mouth daily with breakfast. , Disp: , Rfl:    cholecalciferol (VITAMIN D) 1000 units tablet, Take 1,000 Units by mouth 2 (  two) times daily., Disp: , Rfl:    hydrochlorothiazide (HYDRODIURIL) 12.5 MG tablet, Take 1 tablet by mouth once daily, Disp: 90 tablet, Rfl: 0   Polyethyl Glycol-Propyl Glycol (SYSTANE) 0.4-0.3 % SOLN, Place 1 drop into both eyes at bedtime., Disp: , Rfl:    UNABLE TO FIND, Med Name: vitamins for eyes, Disp: , Rfl:    valACYclovir (VALTREX) 1000 MG tablet, Take 1,000 mg by mouth daily as needed (Cold sore)., Disp: , Rfl:    vitamin B-12 (CYANOCOBALAMIN) 500 MCG tablet, Take 500 mcg by mouth  daily., Disp: , Rfl:    vitamin C (ASCORBIC ACID) 500 MG tablet, Take 500 mg by mouth daily., Disp: , Rfl:    glucose blood (ONETOUCH VERIO) test strip, Use as instructed, Disp: 100 each, Rfl: 3   Lancets (ONETOUCH DELICA PLUS [id number]) MISC, USE TO TEST BLOOD SUGAR BEFORE BREAKFAST AND DINNER, Disp: 100 each, Rfl: 3   No Known Allergies   Review of Systems  Constitutional:  Negative for fatigue.  HENT:  Positive for hearing loss (unable to hear people talk and will have her TV up with the volume).   Eyes: Negative.   Cardiovascular:  Negative for chest pain, palpitations and leg swelling.  Endocrine: Negative for polydipsia, polyphagia and polyuria.  Musculoskeletal: Negative.   Skin: Negative.   Psychiatric/Behavioral: Negative.      Today's Vitals   12/20/21 1500  BP: 126/80  Pulse: 62  Temp: 98 F (36.7 C)  Weight: 145 lb 6.4 oz (66 kg)  Height: 5' 2.8" (1.595 m)  PainSc: 0-No pain   Body mass index is 25.92 kg/m.   Objective:  Physical Exam Vitals reviewed.  Constitutional:      General: She is not in acute distress.    Appearance: Normal appearance.  HENT:     Right Ear: Tympanic membrane, ear canal and external ear normal. There is no impacted cerumen.     Left Ear: Tympanic membrane, ear canal and external ear normal. There is no impacted cerumen.  Cardiovascular:     Rate and Rhythm: Normal rate and regular rhythm.     Pulses: Normal pulses.     Heart sounds: Normal heart sounds. No murmur heard. Pulmonary:     Effort: Pulmonary effort is normal. No respiratory distress.     Breath sounds: Normal breath sounds. No wheezing.  Chest:  Breasts:    Right: Normal.     Left: Tenderness present. No swelling or mass.  Neurological:     General: No focal deficit present.     Mental Status: She is alert and oriented to person, place, and time.     Cranial Nerves: No cranial nerve deficit.     Motor: No weakness.  Psychiatric:        Mood and Affect: Mood  normal.        Behavior: Behavior normal.        Thought Content: Thought content normal.        Judgment: Judgment normal.        Assessment And Plan:     1. Type 2 diabetes mellitus without complication, without long-term current use of insulin (HCC) - glucose blood (ONETOUCH VERIO) test strip; Use as instructed  Dispense: 100 each; Refill: 3 - Lancets (ONETOUCH DELICA PLUS [id number]) MISC; USE TO TEST BLOOD SUGAR BEFORE BREAKFAST AND DINNER  Dispense: 100 each; Refill: 3 - Hemoglobin A1c - CMP14+EGFR - Microalbumin / Creatinine Urine Ratio - POCT Urinalysis Dipstick (81002)  2. Essential  hypertension Comments: Blood pressure is controlled. Continue current medications  3. Elevated cholesterol - Lipid panel  4. Immunization due - Pneumococcal conjugate vaccine 20-valent (Prevnar-20) - Zoster Vaccine Adjuvanted [facility]) injection; Inject 0.5 mLs into the muscle once for 1 dose.  Dispense: 0.5 mL; Refill: 0  5. BMI 25.0-25.9,adult  6. Neuropathy Comments: She is seeing Podiatry and is taking nervive but not using pain cream.    7. Bilateral hearing loss, unspecified hearing loss type - Ambulatory referral to ENT  8. Left-sided chest pain Comments: Was recommended for her to follow up with general surgery from her surgical site, and will obtain a diagnositc mammogram - Ambulatory referral to General Surgery - US BREAST LTD UNI LEFT INC AXILLA; Future  9. Breast pain, left - US BREAST LTD UNI LEFT INC AXILLA; Future  10. Hematuria, unspecified type Comments: Moderate blood in urine will send for culture.  - Urine Culture     Patient was given opportunity to ask questions. Patient verbalized understanding of the plan and was able to repeat key elements of the plan. All questions were answered to their satisfaction.  Minette Brine, FNP   I, Minette Brine, FNP, have reviewed all documentation for this visit. The documentation on 12/20/21 for the exam, diagnosis,  procedures, and orders are all accurate and complete.   IF YOU HAVE BEEN REFERRED TO A SPECIALIST, IT MAY TAKE 1-2 WEEKS TO SCHEDULE/PROCESS THE REFERRAL. IF YOU HAVE NOT HEARD FROM US/SPECIALIST IN TWO WEEKS, PLEASE GIVE [place] A CALL AT [phone].   THE PATIENT IS ENCOURAGED TO PRACTICE SOCIAL DISTANCING DUE TO THE COVID-19 PANDEMIC.

## 2021-12-21 LAB — CMP14+EGFR
ALT: 10 IU/L (ref 0–32)
AST: 16 IU/L (ref 0–40)
Albumin/Globulin Ratio: 1.9 (ref 1.2–2.2)
Albumin: 4.5 g/dL (ref 3.7–4.7)
Alkaline Phosphatase: 90 IU/L (ref 44–121)
BUN/Creatinine Ratio: 19 (ref 12–28)
BUN: 15 mg/dL (ref 8–27)
Bilirubin Total: 0.2 mg/dL (ref 0.0–1.2)
CO2: 26 mmol/L (ref 20–29)
Calcium: 9.4 mg/dL (ref 8.7–10.3)
Chloride: 102 mmol/L (ref 96–106)
Creatinine, Ser: 0.77 mg/dL (ref 0.57–1.00)
Globulin, Total: 2.4 g/dL (ref 1.5–4.5)
Glucose: 71 mg/dL (ref 70–99)
Potassium: 4.2 mmol/L (ref 3.5–5.2)
Sodium: 141 mmol/L (ref 134–144)
Total Protein: 6.9 g/dL (ref 6.0–8.5)
eGFR: 81 mL/min/{1.73_m2} (ref >59)

## 2021-12-21 LAB — LIPID PANEL
Chol/HDL Ratio: 2.7 ratio (ref 0.0–4.4)
Cholesterol, Total: 183 mg/dL (ref 100–199)
HDL: 69 mg/dL (ref >39)
LDL Chol Calc (NIH): 98 mg/dL (ref 0–99)
Triglycerides: 89 mg/dL (ref 0–149)
VLDL Cholesterol Cal: 16 mg/dL (ref 5–40)

## 2021-12-21 LAB — MICROALBUMIN / CREATININE URINE RATIO
Creatinine, Urine: 166.3 mg/dL
Microalb/Creat Ratio: 4 mg/g creat (ref 0–29)
Microalbumin, Urine: 7.2 ug/mL

## 2021-12-21 LAB — HEMOGLOBIN A1C
Est. average glucose Bld gHb Est-mCnc: 120 mg/dL
Hgb A1c MFr Bld: 5.8 % — ABNORMAL HIGH (ref 4.8–5.6)

## 2021-12-22 LAB — URINE CULTURE

## 2022-01-05 ENCOUNTER — Encounter: Payer: Self-pay | Admitting: Nurse Practitioner

## 2022-01-07 ENCOUNTER — Encounter: Payer: Self-pay | Admitting: Nurse Practitioner

## 2022-01-08 ENCOUNTER — Other Ambulatory Visit: Payer: Self-pay | Admitting: Nurse Practitioner

## 2022-01-09 ENCOUNTER — Encounter: Payer: Self-pay | Admitting: Nurse Practitioner

## 2022-01-31 ENCOUNTER — Ambulatory Visit
Admission: RE | Admit: 2022-01-31 | Discharge: 2022-01-31 | Disposition: A | Discharge status: Home / Self Care | Payer: Medicare HMO | Source: Ambulatory Visit | Attending: Nurse Practitioner | Admitting: Nurse Practitioner

## 2022-01-31 DIAGNOSIS — N644 Mastodynia: Secondary | ICD-10-CM | POA: Diagnosis not present

## 2022-01-31 DIAGNOSIS — R079 Chest pain, unspecified: Secondary | ICD-10-CM

## 2022-01-31 DIAGNOSIS — Z853 Personal history of malignant neoplasm of breast: Secondary | ICD-10-CM | POA: Diagnosis not present

## 2022-02-19 ENCOUNTER — Telehealth: Payer: Self-pay | Admitting: Nurse Practitioner

## 2022-02-19 NOTE — Telephone Encounter (Signed)
L/m asking pt to call [phone] ? ?Please give me a call to reschedule my appointment. Thanks ?  ? ?You  [place] (7:32 AM)  ? ?RH ?Good afternoon. ?  ?Due to a schedule change, we have had to reschedule your medicare wellness visit time on 04/04/22 from 10am to 10:20 am. ?  ?This visit will be completed in office  ?  ?If this is not a good day or time, please contact me directly at [phone]. ?  ?  ?Thank you, ?  ?  ?

## 2022-03-14 ENCOUNTER — Telehealth: Payer: Self-pay | Admitting: *Deleted

## 2022-03-14 NOTE — Telephone Encounter (Signed)
Patient is calling for advise on if she can take Nitric oxide for circulation in toes at night. She said the the otc Theotis Burrow is not doing any good. ?

## 2022-03-19 ENCOUNTER — Encounter: Payer: Self-pay | Admitting: Nurse Practitioner

## 2022-03-19 NOTE — Telephone Encounter (Signed)
Called patient to give recommendations per Dr Cannon Kettle, no answer but did leave a voicemail message and for her to call back if she would like to proceed with the referral. ?

## 2022-03-20 ENCOUNTER — Encounter: Payer: Self-pay | Admitting: Nurse Practitioner

## 2022-03-20 ENCOUNTER — Ambulatory Visit (INDEPENDENT_AMBULATORY_CARE_PROVIDER_SITE_OTHER): Payer: Medicare HMO | Admitting: Nurse Practitioner

## 2022-03-20 VITALS — BP 128/60 | HR 63 | Temp 97.9°F | Ht 62.8 in | Wt 147.0 lb

## 2022-03-20 DIAGNOSIS — R3911 Hesitancy of micturition: Secondary | ICD-10-CM | POA: Diagnosis not present

## 2022-03-20 LAB — POCT URINALYSIS DIPSTICK
Bilirubin, UA: NEGATIVE
Glucose, UA: NEGATIVE
Ketones, UA: NEGATIVE
Leukocytes, UA: NEGATIVE
Nitrite, UA: NEGATIVE
Protein, UA: NEGATIVE
Spec Grav, UA: 1.015 (ref 1.010–1.025)
Urobilinogen, UA: 0.2 E.U./dL
pH, UA: 5.5 (ref 5.0–8.0)

## 2022-03-20 MED ORDER — ZOSTER VAC RECOMB ADJUVANTED 50 MCG/0.5ML IM SUSR: 0.5000 mL | Freq: Once | INTRAMUSCULAR | 1 refills | Status: AC

## 2022-03-20 NOTE — Patient Instructions (Signed)
Urinalysis Test ?Why am I having this test? ?You may have a urinalysis (UA) test: ?As part of routine wellness screening. ?Before surgery. ?During pregnancy. ?You may also need to have this test if you have: ?Kidney disease. ?Symptoms of a urinary tract infection (UTI). ?Diabetes. ?A condition that causes an imbalance in your hormones. ?What is being tested? ?A urinalysis is a series of tests done on a sample of your urine. Your kidneys filter your blood to make urine. They get rid of waste products and save the important parts of your blood, such as proteins and minerals (electrolytes). ?You may need more testing if your UA shows too much: ?Protein. ?Sugar (glucose). ?Blood cells. ?Bacteria. ?What kind of sample is taken? ?A urine sample is required for this test. ?How do I collect samples at home? ?You usually collect a urine sample by urinating into a clean cup. You may be asked to collect a urine sample first thing in the morning. When collecting a urine sample at home, make sure you: ?Use supplies and instructions that you received from the lab. ?Collect urine only in the germ-free (sterile) cup that you received from the lab. ?Do not let any toilet paper or stool (feces) get into the cup. ?Refrigerate the sample until you can return it to the lab. ?Return the sample or samples to the lab as told. ?Tell a health care provider about: ?All medicines you are taking, including vitamins, herbs, eye drops, creams, and over-the-counter medicines. ?Any medical conditions you have. ?Whether you are pregnant or may be pregnant. ?What happens during the test? ?UA is divided into three parts: ?A visual exam of your urine sample to check for color or cloudiness. ?A dipstick test to check for: ?Proteins. ?Concentration (specific gravity). ?Acidity (pH). ?Glucose. ?Ketones. These are by-products of your body burning fat for energy instead of sugar. ?A waste product from red blood cells (bilirubin). ?A product of white blood  cells (leukocyte esterase). ?A product of bacteria (nitrite). ?Blood. ?A microscopic exam to check for: ?Red blood cells. ?White blood cells. ?Tube-shaped proteins (hyaline casts). ?Crystal structures. ?Bacteria. ?Epithelial cells. These are cells that line your urinary tract. ?Yeast. ?How are the results reported? ?Some of your test results will be reported as values. Your health care provider will compare your results to normal ranges that were established after testing a large group of people (reference ranges). Reference ranges may vary among different labs and hospitals. For this test, common reference ranges are: ?pH: 4.6-8.0 (average, 6.0). ?Protein: ?0-8 mg/dL. ?50-80 mg/24 hr (at rest). ?Less than 250 mg/24 hr (during exercise). ?Specific gravity: ?Adult: 1.005-1.030 (usually, 1.010-1.025). ?Elderly: values decrease with age. ?Newborn: 1.001-1.020. ?Nitrites: none. ?Ketones: none. ?Bilirubin: none. ?Urobilinogen: 1.66-0 Ehrlich unit/mL. ?Crystals: none. ?Casts: none. ?Glucose: ?Fresh specimen: none. ?24-hour specimen: 50-300 mg/24 hr or 0.3-1.7 mmol/day (SI units). ?White blood cells (WBCs): 0-4 per low-power field. ?WBC casts: none. ?Red blood cells (RBCs): Less than or equal to 2. ?RBC casts: none. ?Epithelial cells: Few or 0-4 per low-power field. ?Bacteria: none. ?Yeast: none. ?Other results may be reported based on the appearance and odor of the sample. For this test, normal results are: ?Appearance: clear. ?Color: amber yellow. ?Odor: aromatic. ?Still other results may be reported as positive or negative. For this test, normal results are: ?Negative for leukocyte esterase. ?What do the results mean? ?Many conditions can cause abnormal UA results: ?Cloudy urine may be a sign of a UTI. ?Acetone odor may indicate a buildup of blood acids in people who  have diabetes (diabetic ketoacidosis). ?Fecal odor can indicate an abnormal connection (fistula) between the intestine and the bladder. ?Ammonia odor can  occur after a person holds urine in the bladder for too long. ?Pungent odor may indicate a UTI. ?Blood in the urine may be a sign of kidney disease, UTI, or other conditions. ?White blood cells may be a sign of a UTI. ?Crystals may be a sign of a kidney stone or other kidney disease. ?High pH may mean you have a kidney stone, UTI, or kidney disease. ?Protein may be a sign of kidney disease, high blood pressure in pregnancy (toxemia), or other conditions. ?Glucose may be a sign of diabetes. ?Urobilinogen may be a sign of liver disease. ?Leukocyte esterase may indicate a UTI. ?Nitrites may indicate a UTI. ?Talk with your health care provider about what your results mean. ?Questions to ask your health care provider ?Ask your health care provider, or the department that is doing the test: ?When will my results be ready? ?How will I get my results? ?What are my treatment options? ?What other tests do I need? ?What are my next steps? ?Summary ?A urinalysis (UA) is a series of tests done on a sample of your urine. The test may be ordered as part of a routine exam, during pregnancy, before surgery, or if you have certain symptoms. ?The urinalysis is divided into three parts: a visual exam, a dipstick test, and a microscopic exam. ?Your health care provider will compare your results to normal ranges that were established after testing a large group of people (reference ranges). ?Talk with your health care provider about what your results mean. ?This information is not intended to replace advice given to you by your health care provider. Make sure you discuss any questions you have with your health care provider. ?Document Revised: 07/07/2020 Document Reviewed: 07/07/2020 ?Elsevier Patient Education ? [employer]. ? ?

## 2022-03-20 NOTE — Progress Notes (Signed)
?Industrial/product designer as a Education administrator for [employer], FNP.,have documented all relevant documentation on the behalf of Minette Brine, FNP,as directed by  Minette Brine, FNP while in the presence of Minette Brine, [place]. ? ?This visit occurred during the SARS-CoV-2 public health emergency.  Safety protocols were in place, including screening questions prior to the visit, additional usage of staff PPE, and extensive cleaning of exam room while observing appropriate contact time as indicated for disinfecting solutions. ? ?Subjective:  ?  ? Patient ID: Yvonne Barron , female    DOB: Sep 07, 1946 , 76 y.o.   MRN: [id number] ? ? ?Chief Complaint  ?Patient presents with  ? Urinary Retention  ? ? ?HPI ? ?Patient presents today for urinary hesitancy x 3 weeks. She would like to see if she has a urinalysis. Denies pelvic or bladder pain, or burning on urination. She is drinking about 2 bottles of water a day. Will go to the bathroom 2 times a night.  ? ?  ? ?Past Medical History:  ?Diagnosis Date  ? Arthritis   ? knee Lt  ? History of breast cancer 2009--  INVASIVE DUCTAL IN SITU--  S/P MASTECTOMY  ? NO RECURRENCE  ? Hypertension   ? Pre-diabetes   ? Urethral caruncle   ?  ? ?Family History  ?Problem Relation Age of Onset  ? Diabetes Mother   ? Diabetes Father   ? Kidney disease Father   ? Diabetes Brother   ? ? ? ?Current Outpatient Medications:  ?  albuterol (VENTOLIN HFA) 108 (90 Base) MCG/ACT inhaler, Inhale 1-2 puffs into the lungs every 6 (six) hours as needed for wheezing or shortness of breath., Disp: 1 each, Rfl: 0 ?  Artificial Tear Ointment (DRY EYES OP), Place 1 drop into both eyes daily as needed (Dry eyes). Thera tears, Disp: , Rfl:  ?  benzonatate (TESSALON) 100 MG capsule, Take 1 capsule (100 mg total) by mouth every 8 (eight) hours., Disp: 21 capsule, Rfl: 0 ?  calcium carbonate (OS-CAL - DOSED IN MG OF ELEMENTAL CALCIUM) 1250 (500 Ca) MG tablet, Take 1 tablet by mouth daily with breakfast. , Disp: , Rfl:   ?  cholecalciferol (VITAMIN D) 1000 units tablet, Take 1,000 Units by mouth 2 (two) times daily., Disp: , Rfl:  ?  glucose blood (ONETOUCH VERIO) test strip, Use as instructed, Disp: 100 each, Rfl: 3 ?  hydrochlorothiazide (HYDRODIURIL) 12.5 MG tablet, Take 1 tablet by mouth once daily, Disp: 90 tablet, Rfl: 0 ?  Lancets (ONETOUCH DELICA PLUS [id number]) MISC, USE TO TEST BLOOD SUGAR BEFORE BREAKFAST AND DINNER, Disp: 100 each, Rfl: 3 ?  Polyethyl Glycol-Propyl Glycol (SYSTANE) 0.4-0.3 % SOLN, Place 1 drop into both eyes at bedtime., Disp: , Rfl:  ?  UNABLE TO FIND, Med Name: vitamins for eyes, Disp: , Rfl:  ?  valACYclovir (VALTREX) 1000 MG tablet, Take 1,000 mg by mouth daily as needed (Cold sore)., Disp: , Rfl:  ?  vitamin B-12 (CYANOCOBALAMIN) 500 MCG tablet, Take 500 mcg by mouth daily., Disp: , Rfl:  ?  vitamin C (ASCORBIC ACID) 500 MG tablet, Take 500 mg by mouth daily., Disp: , Rfl:   ? ?No Known Allergies  ? ?Review of Systems  ?Constitutional: Negative.   ?Respiratory: Negative.    ?Cardiovascular: Negative.   ?Gastrointestinal: Negative.   ?Genitourinary:  Positive for difficulty urinating. Negative for decreased urine volume, flank pain, frequency, pelvic pain and urgency.  ?Neurological: Negative.   ?Psychiatric/Behavioral: Negative.     ? ?  Today's Vitals  ? 03/20/22 1215  ?BP: 128/60  ?Pulse: 63  ?Temp: 97.9 ?F (36.6 ?C)  ?TempSrc: Oral  ?Weight: 147 lb (66.7 kg)  ?Height: 5' 2.8" (1.595 m)  ? ?Body mass index is 26.21 kg/m?.  ? ?Objective:  ?Physical Exam ?Vitals reviewed.  ?Constitutional:   ?   General: She is not in acute distress. ?   Appearance: Normal appearance.  ?Cardiovascular:  ?   Rate and Rhythm: Normal rate and regular rhythm.  ?   Pulses: Normal pulses.  ?   Heart sounds: Normal heart sounds. No murmur heard. ?Pulmonary:  ?   Effort: Pulmonary effort is normal. No respiratory distress.  ?   Breath sounds: Normal breath sounds. No wheezing.  ?Neurological:  ?   General: No focal deficit  present.  ?   Mental Status: She is alert and oriented to person, place, and time.  ?   Cranial Nerves: No cranial nerve deficit.  ?   Motor: No weakness.  ?Psychiatric:     ?   Mood and Affect: Mood normal.     ?   Behavior: Behavior normal.     ?   Thought Content: Thought content normal.     ?   Judgment: Judgment normal.  ?  ? ?   ?Assessment And Plan:  ?   ?1. Urinary hesitancy ?Comments: urinalysis with small blood this is better than previous. Reassured does not have UTI at this time. No other symptoms.  ?- POCT Urinalysis Dipstick (81002) ?  ? ? ?Patient was given opportunity to ask questions. Patient verbalized understanding of the plan and was able to repeat key elements of the plan. All questions were answered to their satisfaction.  ?Minette Brine, FNP  ? ?I, Minette Brine, FNP, have reviewed all documentation for this visit. The documentation on 03/19/22 for the exam, diagnosis, procedures, and orders are all accurate and complete.  ? ?IF YOU HAVE BEEN REFERRED TO A SPECIALIST, IT MAY TAKE 1-2 WEEKS TO SCHEDULE/PROCESS THE REFERRAL. IF YOU HAVE NOT HEARD FROM US/SPECIALIST IN TWO WEEKS, PLEASE GIVE [place] A CALL AT [phone] X [phone].  ? ?THE PATIENT IS ENCOURAGED TO PRACTICE SOCIAL DISTANCING DUE TO THE COVID-19 PANDEMIC.   ?

## 2022-03-26 ENCOUNTER — Telehealth: Payer: Self-pay | Admitting: *Deleted

## 2022-03-26 ENCOUNTER — Other Ambulatory Visit: Payer: Self-pay | Admitting: Sports Medicine

## 2022-03-26 ENCOUNTER — Encounter: Payer: Medicare HMO | Admitting: Nurse Practitioner

## 2022-03-26 DIAGNOSIS — E1142 Type 2 diabetes mellitus with diabetic polyneuropathy: Secondary | ICD-10-CM

## 2022-03-26 DIAGNOSIS — G629 Polyneuropathy, unspecified: Secondary | ICD-10-CM

## 2022-03-26 DIAGNOSIS — E119 Type 2 diabetes mellitus without complications: Secondary | ICD-10-CM

## 2022-03-26 NOTE — Progress Notes (Signed)
Referral sent to [facility] neurology  ?

## 2022-03-26 NOTE — Telephone Encounter (Signed)
Patient is calling to be referred to a Neurologist as discussed. Please advise. ?

## 2022-04-04 ENCOUNTER — Ambulatory Visit: Payer: Medicare HMO | Admitting: Sports Medicine

## 2022-04-04 ENCOUNTER — Ambulatory Visit: Payer: Medicare HMO

## 2022-04-04 ENCOUNTER — Ambulatory Visit: Payer: Medicare HMO | Admitting: Nurse Practitioner

## 2022-04-04 DIAGNOSIS — M79672 Pain in left foot: Secondary | ICD-10-CM | POA: Diagnosis not present

## 2022-04-04 DIAGNOSIS — G629 Polyneuropathy, unspecified: Secondary | ICD-10-CM | POA: Diagnosis not present

## 2022-04-04 DIAGNOSIS — M79671 Pain in right foot: Secondary | ICD-10-CM | POA: Diagnosis not present

## 2022-04-04 NOTE — Patient Instructions (Signed)
[facility] Address: [address], [place], [place] [address] Phone: [phone]  

## 2022-04-04 NOTE — Progress Notes (Signed)
Subjective: ?Yvonne Barron is a 76 y.o. female patient who returns office for follow-up evaluation of numbness and awkward feeling to all toes.  Patient reports that she tried the over-the-counter Nervive nerve relief supplement and it did not help also states that she tried another 1 from the [employer] store and it did not help patient states that she wants to discuss further treatment options and states that she did not get a call from neurology.  No other issues noted. ? ?Patient Active Problem List  ? Diagnosis Date Noted  ? H/O total knee replacement, left 07/28/2020  ? Type 2 diabetes mellitus without complication, without long-term current use of insulin ([place]) 04/15/2019  ? History of arthroscopic procedure on shoulder 11/13/2018  ? Pain in right knee 11/13/2018  ? Abnormal glucose 09/14/2018  ? Vitamin D deficiency 08/29/2018  ? Cervicalgia 08/29/2018  ? Decreased estrogen level 08/29/2018  ? Urinary frequency 08/29/2018  ? Cancer ([place]) 05/18/2018  ? Urethral caruncle 07/10/2012  ? Breast cancer, left breast ([place]) 08/06/2011  ? PARESTHESIA 07/01/2008  ? Breast cancer in situ 12/10/2007  ? Essential hypertension 06/17/2007  ? ? ?Current Outpatient Medications on File Prior to Visit  ?Medication Sig Dispense Refill  ? albuterol (VENTOLIN HFA) 108 (90 Base) MCG/ACT inhaler Inhale 1-2 puffs into the lungs every 6 (six) hours as needed for wheezing or shortness of breath. 1 each 0  ? Artificial Tear Ointment (DRY EYES OP) Place 1 drop into both eyes daily as needed (Dry eyes). Thera tears    ? benzonatate (TESSALON) 100 MG capsule Take 1 capsule (100 mg total) by mouth every 8 (eight) hours. 21 capsule 0  ? calcium carbonate (OS-CAL - DOSED IN MG OF ELEMENTAL CALCIUM) 1250 (500 Ca) MG tablet Take 1 tablet by mouth daily with breakfast.     ? cholecalciferol (VITAMIN D) 1000 units tablet Take 1,000 Units by mouth 2 (two) times daily.    ? glucose blood (ONETOUCH VERIO) test strip Use as instructed 100  each 3  ? hydrochlorothiazide (HYDRODIURIL) 12.5 MG tablet Take 1 tablet by mouth once daily 90 tablet 0  ? Lancets (ONETOUCH DELICA PLUS [id number]) MISC USE TO TEST BLOOD SUGAR BEFORE BREAKFAST AND DINNER 100 each 3  ? Polyethyl Glycol-Propyl Glycol (SYSTANE) 0.4-0.3 % SOLN Place 1 drop into both eyes at bedtime.    ? UNABLE TO FIND Med Name: vitamins for eyes    ? valACYclovir (VALTREX) 1000 MG tablet Take 1,000 mg by mouth daily as needed (Cold sore).    ? vitamin B-12 (CYANOCOBALAMIN) 500 MCG tablet Take 500 mcg by mouth daily.    ? vitamin C (ASCORBIC ACID) 500 MG tablet Take 500 mg by mouth daily.    ? ?No current facility-administered medications on file prior to visit.  ? ? ?No Known Allergies ? ?Objective:  ?General: Alert and oriented x3 in no acute distress ? ?Dermatology: Old scar well-healed at first metatarsophalangeal joint on right.  No open lesions bilateral lower extremities, no webspace macerations, no ecchymosis bilateral, all nails x 10 are well manicured. ? ?Vascular: Dorsalis Pedis and Posterior Tibial pedal pulses palpable. ? ?Neurology: Gross sensation intact via light touch bilateral, vibratory sensation severely diminished bilateral likely consistent with small fiber neuropathy with Semmes Weinstein monofilament protective sensation intact.  Subjective awkward feeling to bilateral toes 1 through 5. ? ?Musculoskeletal: No tenderness with palpation bilateral.  There is severely limited first metatarsophalangeal joint range of motion right greater than left history of previous  Cartevia the procedure unchanged from prior. ? ?Assessment and Plan: ?Problem List Items Addressed This Visit   ?None ?Visit Diagnoses   ? ? Neuropathy    -  Primary  ? Relevant Orders  ? Ambulatory referral to Neurology  ? Foot pain, bilateral      ? ?  ?  ? ?-Complete examination performed ?-Re-Discussed treatment options for what I still believe is neuropathy pain likely small fiber from history of cancer versus  prediabetes patient insists at this time that she is not diabetic does not take any medications but I did make patient well aware that neuropathy symptoms can proceed for diagnosis of diabetes and I made patient aware that she has been treated for this in the past with a diagnosis back in 2020 on her chart of this ?-Offered patient low-dose of Neurontin to try bedtime however she wants to hold off until she gets an opinion from a neurologist in regards to her symptoms ?-Recommend to use topical pain cream or rub at bedtime as needed for pain and gentle massage for any cramping ?-Continue with good supportive shoes daily for foot type ?-Patient to return to office as needed or sooner if condition worsens. ? ?Landis Martins, DPM ? ?

## 2022-04-09 ENCOUNTER — Encounter: Payer: Self-pay | Admitting: Neurology

## 2022-04-11 ENCOUNTER — Ambulatory Visit (INDEPENDENT_AMBULATORY_CARE_PROVIDER_SITE_OTHER): Payer: Medicare HMO

## 2022-04-11 VITALS — Ht 64.0 in | Wt 146.0 lb

## 2022-04-11 DIAGNOSIS — Z0000 Encounter for general adult medical examination without abnormal findings: Secondary | ICD-10-CM

## 2022-04-11 NOTE — Progress Notes (Signed)
?I connected with Malon Kindle today by telephone and verified that I am speaking with the correct person using two identifiers. ?Location patient: home ?Location provider: work ?Persons participating in the virtual visit: Shaylynn, Nulty LPN. ?  ?I discussed the limitations, risks, security and privacy concerns of performing an evaluation and management service by telephone and the availability of in person appointments. I also discussed with the patient that there may be a patient responsible charge related to this service. The patient expressed understanding and verbally consented to this telephonic visit.  ?  ?Interactive audio and video telecommunications were attempted between this provider and patient, however failed, due to patient having technical difficulties OR patient did not have access to video capability.  We continued and completed visit with audio only. ? ?  ? ?Vital signs may be patient reported or missing. ? ?Subjective:  ? Yvonne Barron is a 76 y.o. female who presents for Medicare Annual (Subsequent) preventive examination. ? ?Review of Systems    ? ?Cardiac Risk Factors include: advanced age (>68mn, >>42women);diabetes mellitus;hypertension ? ?   ?Objective:  ?  ?Today's Vitals  ? 04/11/22 1432  ?Weight: 146 lb (66.2 kg)  ?Height: '5\' 4"'$  (1.626 m)  ? ?Body mass index is 25.06 kg/m?. ? ? ?  04/11/2022  ?  2:37 PM 03/28/2021  ?  8:34 AM 07/28/2020  ? 11:29 AM 07/18/2020  ?  1:31 PM 03/16/2020  ?  9:49 AM 03/11/2019  ?  2:24 PM 11/28/2016  ? 11:37 AM  ?Advanced Directives  ?Does Patient Have a Medical Advance Directive? Yes Yes Yes Yes Yes No Yes  ?Type of AParamedicof [place]Living will [place]um[place]Living will [place]ava[place]Living will Living will HYankee HillLiving will  HPaysonLiving will  ?Does patient want to make changes to medical advance directive?   No - Patient declined     No - Patient declined  ?Copy of [place] Chart? No - copy requested No - copy requested No - copy requested  No - copy requested  No - copy requested  ?Would patient like information on creating a medical advance directive?      Yes (MAU/Ambulatory/Procedural Areas - Information given)   ? ? ?Current Medications (verified) ?Outpatient Encounter Medications as of 04/11/2022  ?Medication Sig  ? albuterol (VENTOLIN HFA) 108 (90 Base) MCG/ACT inhaler Inhale 1-2 puffs into the lungs every 6 (six) hours as needed for wheezing or shortness of breath.  ? Artificial Tear Ointment (DRY EYES OP) Place 1 drop into both eyes daily as needed (Dry eyes). Thera tears  ? benzonatate (TESSALON) 100 MG capsule Take 1 capsule (100 mg total) by mouth every 8 (eight) hours.  ? calcium carbonate (OS-CAL - DOSED IN MG OF ELEMENTAL CALCIUM) 1250 (500 Ca) MG tablet Take 1 tablet by mouth daily with breakfast.   ? cholecalciferol (VITAMIN D) 1000 units tablet Take 1,000 Units by mouth 2 (two) times daily.  ? glucose blood (ONETOUCH VERIO) test strip Use as instructed  ? hydrochlorothiazide (HYDRODIURIL) 12.5 MG tablet Take 1 tablet by mouth once daily  ? Lancets (ONETOUCH DELICA PLUS [id number] MISC USE TO TEST BLOOD SUGAR BEFORE BREAKFAST AND DINNER  ? Polyethyl Glycol-Propyl Glycol (SYSTANE) 0.4-0.3 % SOLN Place 1 drop into both eyes at bedtime.  ? UNABLE TO FIND Med Name: vitamins for eyes  ? valACYclovir (VALTREX) 1000 MG tablet Take 1,000 mg by mouth  daily as needed (Cold sore).  ? vitamin B-12 (CYANOCOBALAMIN) 500 MCG tablet Take 500 mcg by mouth daily.  ? vitamin C (ASCORBIC ACID) 500 MG tablet Take 500 mg by mouth daily.  ? ?No facility-administered encounter medications on file as of 04/11/2022.  ? ? ?Allergies (verified) ?Patient has no known allergies.  ? ?History: ?Past Medical History:  ?Diagnosis Date  ? Arthritis   ? knee Lt  ? History of breast cancer 2009--  INVASIVE DUCTAL IN SITU--  S/P MASTECTOMY  ? NO  RECURRENCE  ? Hypertension   ? Pre-diabetes   ? Urethral caruncle   ? ?Past Surgical History:  ?Procedure Laterality Date  ? CYSTOSCOPY WITH URETHRAL CARUNCLE  07/10/2012  ? Procedure: CYSTOSCOPY WITH URETHRAL CARUNCLE;  Surgeon: Claybon Jabs, MD;  Location: [facility];  Service: Urology;  Laterality: N/A;   ? ?EXCISION OF CARUNCLE ?  ? LEFT TOTAL MASTECTOMY/ LEFT AXILLARY SENTINEL LYMPH NODE BX  03-01-2008  ? INVASIVE  DUCTAL CARCINOMA IN SITU  ? LEFT WRIST SURG  2008  ? REPAIR NERVE  ? MASTECTOMY Left 2009  ? TOE SURGERY Right 2017  ? Great toe  ? TOTAL KNEE ARTHROPLASTY Left 07/28/2020  ? Procedure: TOTAL KNEE ARTHROPLASTY;  Surgeon: Netta Cedars, MD;  Location: [facility];  Service: Orthopedics;  Laterality: Left;  ? VAGINAL HYSTERECTOMY  1998  ? W/ BILATERAL SALPINGOOPHECTOMY  ? ?Family History  ?Problem Relation Age of Onset  ? Diabetes Mother   ? Diabetes Father   ? Kidney disease Father   ? Diabetes Brother   ? ?Social History  ? ?Socioeconomic History  ? Marital status: Divorced  ?  Spouse name: Not on file  ? Number of children: Not on file  ? Years of education: Not on file  ? Highest education level: Not on file  ?Occupational History  ? Occupation: semi- retired  ?Tobacco Use  ? Smoking status: Former  ?  Packs/day: 2.00  ?  Years: 20.00  ?  Pack years: 40.00  ?  Types: Cigarettes  ?  Quit date: 01/19/1987  ?  Years since quitting: 35.2  ? Smokeless tobacco: Never  ?Vaping Use  ? Vaping Use: Never used  ?Substance and Sexual Activity  ? Alcohol use: No  ? Drug use: Never  ? Sexual activity: Not on file  ?Other Topics Concern  ? Not on file  ?Social History Narrative  ? Not on file  ? ?Social Determinants of Health  ? ?Financial Resource Strain: Low Risk   ? Difficulty of Paying Living Expenses: Not hard at all  ?Food Insecurity: No Food Insecurity  ? Worried About Charity fundraiser in the Last Year: Never true  ? Ran Out of Food in the Last Year: Never true  ?Transportation Needs: No  Transportation Needs  ? Lack of Transportation (Medical): No  ? Lack of Transportation (Non-Medical): No  ?Physical Activity: Sufficiently Active  ? Days of Exercise per Week: 3 days  ? Minutes of Exercise per Session: 50 min  ?Stress: No Stress Concern Present  ? Feeling of Stress : Not at all  ?Social Connections: Not on file  ? ? ?Tobacco Counseling ?Counseling given: Not Answered ? ? ?Clinical Intake: ? ?Pre-visit preparation completed: Yes ? ?Pain : No/denies pain ? ?  ? ?Nutritional Status: BMI 25 -29 Overweight ?Nutritional Risks: None ?Diabetes: No ? ?How often do you need to have someone help you when you read instructions, pamphlets, or other written materials  from your doctor or pharmacy?: 1 - Never ?What is the last grade level you completed in school?: 60yrcollege ? ?Diabetic? Yes ?Nutrition Risk Assessment: ? ?Has the patient had any N/V/D within the last 2 months?  No  ?Does the patient have any non-healing wounds?  No  ?Has the patient had any unintentional weight loss or weight gain?  No  ? ?Diabetes: ? ?Is the patient diabetic?  Yes  ?If diabetic, was a CBG obtained today?  No  ?Did the patient bring in their glucometer from home?  No  ?How often do you monitor your CBG's? daily.  ? ?Financial Strains and Diabetes Management: ? ?Are you having any financial strains with the device, your supplies or your medication? No .  ?Does the patient want to be seen by Chronic Care Management for management of their diabetes?  No  ?Would the patient like to be referred to a Nutritionist or for Diabetic Management?  No  ? ?Diabetic Exams: ? ?Diabetic Eye Exam: Completed 06/27/2021 ?Diabetic Foot Exam: Completed 09/13/2021 ? ? ?Interpreter Needed?: No ? ?Information entered by :: NAllen LPN ? ? ?Activities of Daily Living ? ?  04/11/2022  ?  2:38 PM  ?In your present state of health, do you have any difficulty performing the following activities:  ?Hearing? 0  ?Vision? 0  ?Difficulty concentrating or making  decisions? 0  ?Walking or climbing stairs? 0  ?Dressing or bathing? 0  ?Doing errands, shopping? 0  ?Preparing Food and eating ? N  ?Using the Toilet? N  ?In the past six months, have you accidently leaked urine? N

## 2022-04-11 NOTE — Patient Instructions (Signed)
Yvonne Barron , ?Thank you for taking time to come for your Medicare Wellness Visit. I appreciate your ongoing commitment to your health goals. Please review the following plan we discussed and let me know if I can assist you in the future.  ? ?Screening recommendations/referrals: ?Colonoscopy: completed 01/19/2019, due 01/19/2029 ?Mammogram: completed 06/29/2021, due 06/30/2022 ?Bone Density: completed 12/12/2020 ?Recommended yearly ophthalmology/optometry visit for glaucoma screening and checkup ?Recommended yearly dental visit for hygiene and checkup ? ?Vaccinations: ?Influenza vaccine: due 07/09/2022 ?Pneumococcal vaccine: completed 12/20/2021 ?Tdap vaccine: completed 09/27/2012, due 09/27/2022 ?Shingles vaccine: discussed   ?Covid-19: 10/26/2020, 02/16/2020, 01/22/2020 ? ?Advanced directives: Please bring a copy of your POA (Power of Attorney) and/or Living Will to your next appointment.  ? ?Conditions/risks identified: none ? ?Next appointment: Follow up in one year for your annual wellness visit  ? ? ?Preventive Care 74 Years and Older, Female ?Preventive care refers to lifestyle choices and visits with your health care provider that can promote health and wellness. ?What does preventive care include? ?A yearly physical exam. This is also called an annual well check. ?Dental exams once or twice a year. ?Routine eye exams. Ask your health care provider how often you should have your eyes checked. ?Personal lifestyle choices, including: ?Daily care of your teeth and gums. ?Regular physical activity. ?Eating a healthy diet. ?Avoiding tobacco and drug use. ?Limiting alcohol use. ?Practicing safe sex. ?Taking low-dose aspirin every day. ?Taking vitamin and mineral supplements as recommended by your health care provider. ?What happens during an annual well check? ?The services and screenings done by your health care provider during your annual well check will depend on your age, overall health, lifestyle risk factors, and family  history of disease. ?Counseling  ?Your health care provider may ask you questions about your: ?Alcohol use. ?Tobacco use. ?Drug use. ?Emotional well-being. ?Home and relationship well-being. ?Sexual activity. ?Eating habits. ?History of falls. ?Memory and ability to understand (cognition). ?Work and work Statistician. ?Reproductive health. ?Screening  ?You may have the following tests or measurements: ?Height, weight, and BMI. ?Blood pressure. ?Lipid and cholesterol levels. These may be checked every 5 years, or more frequently if you are over 74 years old. ?Skin check. ?Lung cancer screening. You may have this screening every year starting at age 74 if you have a 30-pack-year history of smoking and currently smoke or have quit within the past 15 years. ?Fecal occult blood test (FOBT) of the stool. You may have this test every year starting at age 74. ?Flexible sigmoidoscopy or colonoscopy. You may have a sigmoidoscopy every 5 years or a colonoscopy every 10 years starting at age 45. ?Hepatitis C blood test. ?Hepatitis B blood test. ?Sexually transmitted disease (STD) testing. ?Diabetes screening. This is done by checking your blood sugar (glucose) after you have not eaten for a while (fasting). You may have this done every 1-3 years. ?Bone density scan. This is done to screen for osteoporosis. You may have this done starting at age 74. ?Mammogram. This may be done every 1-2 years. Talk to your health care provider about how often you should have regular mammograms. ?Talk with your health care provider about your test results, treatment options, and if necessary, the need for more tests. ?Vaccines  ?Your health care provider may recommend certain vaccines, such as: ?Influenza vaccine. This is recommended every year. ?Tetanus, diphtheria, and acellular pertussis (Tdap, Td) vaccine. You may need a Td booster every 10 years. ?Zoster vaccine. You may need this after age 74. ?Pneumococcal 13-valent conjugate (  PCV13)  vaccine. One dose is recommended after age 74. ?Pneumococcal polysaccharide (PPSV23) vaccine. One dose is recommended after age 74. ?Talk to your health care provider about which screenings and vaccines you need and how often you need them. ?This information is not intended to replace advice given to you by your health care provider. Make sure you discuss any questions you have with your health care provider. ?Document Released: 12/22/2015 Document Revised: 08/14/2016 Document Reviewed: 09/26/2015 ?Elsevier Interactive Patient Education ? 2017 [place]. ? ?Fall Prevention in the Home ?Falls can cause injuries. They can happen to people of all ages. There are many things you can do to make your home safe and to help prevent falls. ?What can I do on the outside of my home? ?Regularly fix the edges of walkways and driveways and fix any cracks. ?Remove anything that might make you trip as you walk through a door, such as a raised step or threshold. ?Trim any bushes or trees on the path to your home. ?Use bright outdoor lighting. ?Clear any walking paths of anything that might make someone trip, such as rocks or tools. ?Regularly check to see if handrails are loose or broken. Make sure that both sides of any steps have handrails. ?Any raised decks and porches should have guardrails on the edges. ?Have any leaves, snow, or ice cleared regularly. ?Use sand or salt on walking paths during winter. ?Clean up any spills in your garage right away. This includes oil or grease spills. ?What can I do in the bathroom? ?Use night lights. ?Install grab bars by the toilet and in the tub and shower. Do not use towel bars as grab bars. ?Use non-skid mats or decals in the tub or shower. ?If you need to sit down in the shower, use a plastic, non-slip stool. ?Keep the floor dry. Clean up any water that spills on the floor as soon as it happens. ?Remove soap buildup in the tub or shower regularly. ?Attach bath mats securely with  double-sided non-slip rug tape. ?Do not have throw rugs and other things on the floor that can make you trip. ?What can I do in the bedroom? ?Use night lights. ?Make sure that you have a light by your bed that is easy to reach. ?Do not use any sheets or blankets that are too big for your bed. They should not hang down onto the floor. ?Have a firm chair that has side arms. You can use this for support while you get dressed. ?Do not have throw rugs and other things on the floor that can make you trip. ?What can I do in the kitchen? ?Clean up any spills right away. ?Avoid walking on wet floors. ?Keep items that you use a lot in easy-to-reach places. ?If you need to reach something above you, use a strong step stool that has a grab bar. ?Keep electrical cords out of the way. ?Do not use floor polish or wax that makes floors slippery. If you must use wax, use non-skid floor wax. ?Do not have throw rugs and other things on the floor that can make you trip. ?What can I do with my stairs? ?Do not leave any items on the stairs. ?Make sure that there are handrails on both sides of the stairs and use them. Fix handrails that are broken or loose. Make sure that handrails are as long as the stairways. ?Check any carpeting to make sure that it is firmly attached to the stairs. Fix any carpet that is  loose or worn. ?Avoid having throw rugs at the top or bottom of the stairs. If you do have throw rugs, attach them to the floor with carpet tape. ?Make sure that you have a light switch at the top of the stairs and the bottom of the stairs. If you do not have them, ask someone to add them for you. ?What else can I do to help prevent falls? ?Wear shoes that: ?Do not have high heels. ?Have rubber bottoms. ?Are comfortable and fit you well. ?Are closed at the toe. Do not wear sandals. ?If you use a stepladder: ?Make sure that it is fully opened. Do not climb a closed stepladder. ?Make sure that both sides of the stepladder are locked  into place. ?Ask someone to hold it for you, if possible. ?Clearly mark and make sure that you can see: ?Any grab bars or handrails. ?First and last steps. ?Where the edge of each step is. ?Use tools that help you

## 2022-04-19 NOTE — Telephone Encounter (Signed)
Patient notified

## 2022-04-24 ENCOUNTER — Other Ambulatory Visit: Payer: Self-pay | Admitting: Nurse Practitioner

## 2022-04-24 DIAGNOSIS — I10 Essential (primary) hypertension: Secondary | ICD-10-CM

## 2022-04-25 ENCOUNTER — Ambulatory Visit (INDEPENDENT_AMBULATORY_CARE_PROVIDER_SITE_OTHER): Payer: Medicare HMO | Admitting: Nurse Practitioner

## 2022-04-25 ENCOUNTER — Encounter: Payer: Self-pay | Admitting: Nurse Practitioner

## 2022-04-25 VITALS — BP 124/66 | HR 66 | Temp 98.1°F | Ht 64.0 in | Wt 147.0 lb

## 2022-04-25 DIAGNOSIS — E119 Type 2 diabetes mellitus without complications: Secondary | ICD-10-CM

## 2022-04-25 DIAGNOSIS — Z9012 Acquired absence of left breast and nipple: Secondary | ICD-10-CM | POA: Diagnosis not present

## 2022-04-25 DIAGNOSIS — Z79899 Other long term (current) drug therapy: Secondary | ICD-10-CM | POA: Diagnosis not present

## 2022-04-25 DIAGNOSIS — I10 Essential (primary) hypertension: Secondary | ICD-10-CM | POA: Diagnosis not present

## 2022-04-25 DIAGNOSIS — Z0000 Encounter for general adult medical examination without abnormal findings: Secondary | ICD-10-CM

## 2022-04-25 DIAGNOSIS — E7800 Pure hypercholesterolemia, unspecified: Secondary | ICD-10-CM | POA: Diagnosis not present

## 2022-04-25 DIAGNOSIS — Z853 Personal history of malignant neoplasm of breast: Secondary | ICD-10-CM | POA: Diagnosis not present

## 2022-04-25 NOTE — Progress Notes (Signed)
I,Tianna Badgett,acting as a Education administrator for [employer], FNP.,have documented all relevant documentation on the behalf of Minette Brine, FNP,as directed by  Minette Brine, FNP while in the presence of Minette Brine, [place].  This visit occurred during the SARS-CoV-2 public health emergency.  Safety protocols were in place, including screening questions prior to the visit, additional usage of staff PPE, and extensive cleaning of exam room while observing appropriate contact time as indicated for disinfecting solutions.  Subjective:     Patient ID: Yvonne Barron , female    DOB: [birth year] , 76 y.o.   MRN: [id number]   Chief Complaint  Patient presents with   Annual Exam    HPI  Here for HM. She has been to podiatry (Dr. Joni Fears) to help with neuropathy.   Wt Readings from Last 3 Encounters: 04/25/22 : 147 lb (66.7 kg) 04/11/22 : 146 lb (66.2 kg) 03/20/22 : 147 lb (66.7 kg)      Past Medical History:  Diagnosis Date   Arthritis    knee Lt   History of breast cancer 2009--  INVASIVE DUCTAL IN SITU--  S/P MASTECTOMY   NO RECURRENCE   Hypertension    Pre-diabetes    Urethral caruncle      Family History  Problem Relation Age of Onset   Diabetes Mother    Diabetes Father    Kidney disease Father    Diabetes Brother      Current Outpatient Medications:    albuterol (VENTOLIN HFA) 108 (90 Base) MCG/ACT inhaler, Inhale 1-2 puffs into the lungs every 6 (six) hours as needed for wheezing or shortness of breath., Disp: 1 each, Rfl: 0   Artificial Tear Ointment (DRY EYES OP), Place 1 drop into both eyes daily as needed (Dry eyes). Thera tears, Disp: , Rfl:    benzonatate (TESSALON) 100 MG capsule, Take 1 capsule (100 mg total) by mouth every 8 (eight) hours., Disp: 21 capsule, Rfl: 0   calcium carbonate (OS-CAL - DOSED IN MG OF ELEMENTAL CALCIUM) 1250 (500 Ca) MG tablet, Take 1 tablet by mouth daily with breakfast. , Disp: , Rfl:    cholecalciferol (VITAMIN D) 1000 units  tablet, Take 1,000 Units by mouth 2 (two) times daily., Disp: , Rfl:    glucose blood (ONETOUCH VERIO) test strip, Use as instructed, Disp: 100 each, Rfl: 3   hydrochlorothiazide (HYDRODIURIL) 12.5 MG tablet, Take 1 tablet by mouth once daily, Disp: 90 tablet, Rfl: 0   Lancets (ONETOUCH DELICA PLUS IOXBDZ32D) MISC, USE TO TEST BLOOD SUGAR BEFORE BREAKFAST AND DINNER, Disp: 100 each, Rfl: 3   Polyethyl Glycol-Propyl Glycol (SYSTANE) 0.4-0.3 % SOLN, Place 1 drop into both eyes at bedtime., Disp: , Rfl:    UNABLE TO FIND, Med Name: vitamins for eyes, Disp: , Rfl:    valACYclovir (VALTREX) 1000 MG tablet, Take 1,000 mg by mouth daily as needed (Cold sore)., Disp: , Rfl:    vitamin B-12 (CYANOCOBALAMIN) 500 MCG tablet, Take 500 mcg by mouth daily., Disp: , Rfl:    vitamin C (ASCORBIC ACID) 500 MG tablet, Take 500 mg by mouth daily., Disp: , Rfl:    No Known Allergies    The patient states she is status post hysterectomy.  Negative for: breast discharge, breast lump(s), breast pain and breast self exam. Associated symptoms include abnormal vaginal bleeding. Pertinent negatives include abnormal bleeding (hematology), anxiety, decreased libido, depression, difficulty falling sleep, dyspareunia, history of infertility, nocturia, sexual dysfunction, sleep disturbances, urinary incontinence, urinary urgency, vaginal discharge and  vaginal itching. Diet regular - tries to eat clean.  She is drinking about 3 bottles of water a day. The patient states her exercise level is moderate - 2-3 days a week.  The patient's tobacco use is:  Social History   Tobacco Use  Smoking Status Former   Packs/day: 2.00   Years: 20.00   Pack years: 40.00   Types: Cigarettes   Quit date: 01/19/1987   Years since quitting: 35.2  Smokeless Tobacco Never  . She has been exposed to passive smoke. The patient's alcohol use is:  Social History   Substance and Sexual Activity  Alcohol Use No    Review of Systems   Constitutional: Negative.   HENT: Negative.    Eyes: Negative.   Respiratory: Negative.    Cardiovascular: Negative.   Gastrointestinal: Negative.   Endocrine: Negative.   Genitourinary: Negative.   Musculoskeletal: Negative.   Skin: Negative.   Allergic/Immunologic: Negative.   Neurological: Negative.   Hematological: Negative.   Psychiatric/Behavioral:  Positive for sleep disturbance (some times has difficulty going to sleep.).     Today's Vitals   04/25/22 1505  BP: 124/66  Pulse: 66  Temp: 98.1 F (36.7 C)  TempSrc: Oral  Weight: 147 lb (66.7 kg)  Height: _0  (1.626 m)   Body mass index is 25.23 kg/m.   Objective:  Physical Exam Vitals reviewed.  Constitutional:      General: She is not in acute distress.    Appearance: Normal appearance. She is well-developed.  HENT:     Head: Normocephalic and atraumatic.     Right Ear: Hearing, tympanic membrane, ear canal and external ear normal. There is no impacted cerumen.     Left Ear: Hearing, tympanic membrane, ear canal and external ear normal. There is no impacted cerumen.     Nose:     Comments: Deferred - masked    Mouth/Throat:     Comments: Deferred - masked Eyes:     General: Lids are normal.     Extraocular Movements: Extraocular movements intact.     Conjunctiva/sclera: Conjunctivae normal.     Pupils: Pupils are equal, round, and reactive to light.     Funduscopic exam:    Right eye: No papilledema.        Left eye: No papilledema.  Neck:     Thyroid: No thyroid mass.     Vascular: No carotid bruit.  Cardiovascular:     Rate and Rhythm: Normal rate and regular rhythm.     Pulses: Normal pulses.     Heart sounds: Normal heart sounds. No murmur heard. Pulmonary:     Effort: Pulmonary effort is normal. No respiratory distress.     Breath sounds: Normal breath sounds.  Chest:     Chest wall: No mass.  Breasts:    Tanner Score is 5.     Right: Normal. No mass or tenderness.     Left: Absent. No  mass or tenderness.  Abdominal:     General: Abdomen is flat. Bowel sounds are normal. There is no distension.     Palpations: Abdomen is soft.     Tenderness: There is no abdominal tenderness.  Musculoskeletal:        General: No swelling or tenderness. Normal range of motion.     Cervical back: Full passive range of motion without pain, normal range of motion and neck supple.     Right lower leg: No edema.     Left lower leg:  No edema.  Lymphadenopathy:     Upper Body:     Right upper body: No supraclavicular, axillary or pectoral adenopathy.     Left upper body: No supraclavicular, axillary or pectoral adenopathy.  Skin:    General: Skin is warm and dry.     Capillary Refill: Capillary refill takes less than 2 seconds.  Neurological:     General: No focal deficit present.     Mental Status: She is alert and oriented to person, place, and time.     Cranial Nerves: No cranial nerve deficit.     Sensory: No sensory deficit.  Psychiatric:        Mood and Affect: Mood normal.        Behavior: Behavior normal.        Thought Content: Thought content normal.        Judgment: Judgment normal.        Assessment And Plan:     1. Encounter for general adult medical examination w/o abnormal findings Behavior modifications discussed and diet history reviewed.   Pt will continue to exercise regularly and modify diet with low GI, plant based foods and decrease intake of processed foods.  Recommend intake of daily multivitamin, Vitamin D, and calcium.  Recommend mammogram and colonoscopy (up to date) for preventive screenings, as well as recommend immunizations that include influenza, TDAP, and Shingles (she is still thinking about getting shingrix)  2. Type 2 diabetes mellitus without complication, without long-term current use of insulin (HCC) Comments: Continue current medications  - Hemoglobin A1c  3. Essential hypertension Comments: Blood pressure is well controlled, continue  current medications. EKG done with Nonspecific t abnormality HR 61 - EKG 12-Lead - CMP14+EGFR  4. Elevated cholesterol Comments: Cholesterol levels are stable, diet controlled. Encouraged to limit intake of fried and fatty foods  - Lipid panel  5. Other long term (current) drug therapy - CBC  6. History of left mastectomy  7. History of breast cancer     Patient was given opportunity to ask questions. Patient verbalized understanding of the plan and was able to repeat key elements of the plan. All questions were answered to their satisfaction.   Minette Brine, FNP   I, Minette Brine, FNP, have reviewed all documentation for this visit. The documentation on 04/25/22 for the exam, diagnosis, procedures, and orders are all accurate and complete.  THE PATIENT IS ENCOURAGED TO PRACTICE SOCIAL DISTANCING DUE TO THE COVID-19 PANDEMIC.

## 2022-04-25 NOTE — Patient Instructions (Signed)

## 2022-04-26 LAB — CMP14+EGFR
ALT: 12 IU/L (ref 0–32)
AST: 15 IU/L (ref 0–40)
Albumin/Globulin Ratio: 1.6 (ref 1.2–2.2)
Albumin: 4.4 g/dL (ref 3.7–4.7)
Alkaline Phosphatase: 85 IU/L (ref 44–121)
BUN/Creatinine Ratio: 18 (ref 12–28)
BUN: 16 mg/dL (ref 8–27)
Bilirubin Total: <0.2 mg/dL (ref 0.0–1.2)
CO2: 26 mmol/L (ref 20–29)
Calcium: 9.4 mg/dL (ref 8.7–10.3)
Chloride: 103 mmol/L (ref 96–106)
Creatinine, Ser: 0.89 mg/dL (ref 0.57–1.00)
Globulin, Total: 2.8 g/dL (ref 1.5–4.5)
Glucose: 72 mg/dL (ref 70–99)
Potassium: 4.2 mmol/L (ref 3.5–5.2)
Sodium: 143 mmol/L (ref 134–144)
Total Protein: 7.2 g/dL (ref 6.0–8.5)
eGFR: 68 mL/min/{1.73_m2} (ref >59)

## 2022-04-26 LAB — LIPID PANEL
Chol/HDL Ratio: 2.6 ratio (ref 0.0–4.4)
Cholesterol, Total: 200 mg/dL — ABNORMAL HIGH (ref 100–199)
HDL: 78 mg/dL (ref >39)
LDL Chol Calc (NIH): 102 mg/dL — ABNORMAL HIGH (ref 0–99)
Triglycerides: 114 mg/dL (ref 0–149)
VLDL Cholesterol Cal: 20 mg/dL (ref 5–40)

## 2022-04-26 LAB — CBC
Hematocrit: 37 % (ref 34.0–46.6)
Hemoglobin: 12 g/dL (ref 11.1–15.9)
MCH: 27.1 pg (ref 26.6–33.0)
MCHC: 32.4 g/dL (ref 31.5–35.7)
MCV: 84 fL (ref 79–97)
Platelets: 333 10*3/uL (ref 150–450)
RBC: 4.43 x10E6/uL (ref 3.77–5.28)
RDW: 13.1 % (ref 11.7–15.4)
WBC: 7 10*3/uL (ref 3.4–10.8)

## 2022-04-26 LAB — HEMOGLOBIN A1C
Est. average glucose Bld gHb Est-mCnc: 126 mg/dL
Hgb A1c MFr Bld: 6 % — ABNORMAL HIGH (ref 4.8–5.6)

## 2022-05-08 ENCOUNTER — Encounter: Payer: Self-pay | Admitting: Nurse Practitioner

## 2022-06-06 DIAGNOSIS — L821 Other seborrheic keratosis: Secondary | ICD-10-CM | POA: Diagnosis not present

## 2022-06-06 DIAGNOSIS — L814 Other melanin hyperpigmentation: Secondary | ICD-10-CM | POA: Diagnosis not present

## 2022-06-06 DIAGNOSIS — D225 Melanocytic nevi of trunk: Secondary | ICD-10-CM | POA: Diagnosis not present

## 2022-06-06 DIAGNOSIS — D485 Neoplasm of uncertain behavior of skin: Secondary | ICD-10-CM | POA: Diagnosis not present

## 2022-07-10 DIAGNOSIS — H25813 Combined forms of age-related cataract, bilateral: Secondary | ICD-10-CM | POA: Diagnosis not present

## 2022-07-10 DIAGNOSIS — H35033 Hypertensive retinopathy, bilateral: Secondary | ICD-10-CM | POA: Diagnosis not present

## 2022-07-10 DIAGNOSIS — H353132 Nonexudative age-related macular degeneration, bilateral, intermediate dry stage: Secondary | ICD-10-CM | POA: Diagnosis not present

## 2022-07-10 LAB — HM DIABETES EYE EXAM

## 2022-07-14 ENCOUNTER — Other Ambulatory Visit: Payer: Self-pay | Admitting: Nurse Practitioner

## 2022-07-14 DIAGNOSIS — I10 Essential (primary) hypertension: Secondary | ICD-10-CM

## 2022-07-20 NOTE — Progress Notes (Unsigned)
Initial neurology clinic note  SERVICE DATE: 07/24/22 SERVICE TIME: 2:30 pm  Reason for Evaluation: Consultation requested by Landis Martins, DPM for an opinion regarding numbness and tingling of toes. My final recommendations will be communicated back to the requesting physician by way of shared medical record or letter to requesting physician via [place] mail.  HPI: This is Ms. Lucious Groves, a 76 y.o. right-handed female with a medical history of pre-diabetes, OA s/p left knee replacement, vit D deficiency, breast cancer (dx about 13 years ago, had surgery, no chemo or radiation, but tomoxifen), and HTN who presents to neurology clinic with the chief complaint above. The patient is alone.  Patient first noticed numbness and tingling in her toes about 2 years ago. At first it just felt like her feet her dry. Lotion did not help. At night her toes would tingle. The tingling is mostly at night. It does not affect her as much when she is up and walking. Sometimes she feels twitching in her legs that she can move her legs around and it will help. Lately, she feels an occasional crawling sensation on her legs. She can feel tightness under her toes and like she is "walking on bubbles." She denies weakness of her legs. She walks often and does not use assistive devices. She denies falls.  She denies significant neck or back pain. She did have some pain in her back for which she went to ED (09/2021). She was diagnosed with left low back pain and sciatica. She was prescribed steroids and muscle relaxers which she only took one or two. She takes tumeric and bromelain, which helps. This pain does not radiate to her feet though.She denies bowel or bladder symptoms. The patient denies symptoms suggestive of oculobulbar weakness including diplopia, ptosis, dysphagia, poor saliva control, dysarthria/dysphonia, impaired mastication, facial weakness/droop.  There are no neuromuscular respiratory weakness  symptoms, particularly orthopnea>dyspnea.  The patient does not report symptoms referable to autonomic dysfunction including impaired sweating, heat or cold intolerance, excessive mucosal dryness, gastroparetic early satiety, postprandial abdominal bloating, bowel or bladder dyscontrol, or syncope/presyncope/orthostatic intolerance. She does endorse constipation.  The patient has not noticed any recent skin rashes nor does she report any constitutional symptoms like fever, night sweats, anorexia or unintentional weight loss.  Her PCP recommended podiatry referral. Patient sees Dr Cannon Kettle in podiatry. The latest clinic note from 04/04/22 was reviewed. Discussion of possible neuropathy was had, so patient was referred to neurology for further work up. Gabapentin was offered, but patient declined until her neurology appointment.  Of note, patient mentions that as a child, she would walk barefoot and her toes would pop a lot.  EtOH use: Occasional wine with dinner  Restrictive diet? No Family history of neuropathy/myopathy/NM disease? No   Prior medications that have been tried: [employer] supplement (did not help); also previously took B12, but has stopped. She has also tried nerve stimulation that helped, but she could not afford to continue that.  MEDICATIONS:  Outpatient Encounter Medications as of 07/24/2022  Medication Sig   albuterol (VENTOLIN HFA) 108 (90 Base) MCG/ACT inhaler Inhale 1-2 puffs into the lungs every 6 (six) hours as needed for wheezing or shortness of breath.   Artificial Tear Ointment (DRY EYES OP) Place 1 drop into both eyes daily as needed (Dry eyes). Thera tears   calcium carbonate (OS-CAL - DOSED IN MG OF ELEMENTAL CALCIUM) 1250 (500 Ca) MG tablet Take 1 tablet by mouth daily with breakfast.    cholecalciferol (  VITAMIN D) 1000 units tablet Take 1,000 Units by mouth 2 (two) times daily.   glucose blood (ONETOUCH VERIO) test strip Use as instructed   hydrochlorothiazide  (HYDRODIURIL) 12.5 MG tablet Take 1 tablet by mouth once daily   Lancets (ONETOUCH DELICA PLUS [id number]) MISC USE TO TEST BLOOD SUGAR BEFORE BREAKFAST AND DINNER   Polyethyl Glycol-Propyl Glycol (SYSTANE) 0.4-0.3 % SOLN Place 1 drop into both eyes at bedtime.   UNABLE TO FIND Med Name: vitamins for eyes   valACYclovir (VALTREX) 1000 MG tablet Take 1,000 mg by mouth daily as needed (Cold sore).   vitamin B-12 (CYANOCOBALAMIN) 500 MCG tablet Take 500 mcg by mouth daily.   vitamin C (ASCORBIC ACID) 500 MG tablet Take 500 mg by mouth daily.   benzonatate (TESSALON) 100 MG capsule Take 1 capsule (100 mg total) by mouth every 8 (eight) hours. (Patient not taking: Reported on 07/24/2022)   No facility-administered encounter medications on file as of 07/24/2022.    PAST MEDICAL HISTORY: Past Medical History:  Diagnosis Date   Arthritis    knee Lt   History of breast cancer 2009--  INVASIVE DUCTAL IN SITU--  S/P MASTECTOMY   NO RECURRENCE   Hypertension    Pre-diabetes    Urethral caruncle     PAST SURGICAL HISTORY: Past Surgical History:  Procedure Laterality Date   CYSTOSCOPY WITH URETHRAL CARUNCLE  07/10/2012   Procedure: CYSTOSCOPY WITH URETHRAL CARUNCLE;  Surgeon: Claybon Jabs, MD;  Location: [facility];  Service: Urology;  Laterality: N/A;    EXCISION OF CARUNCLE    LEFT TOTAL MASTECTOMY/ LEFT AXILLARY SENTINEL LYMPH NODE BX  03-01-2008   INVASIVE  DUCTAL CARCINOMA IN SITU   LEFT WRIST SURG  2008   REPAIR NERVE   MASTECTOMY Left 2009   TOE SURGERY Right 2017   Great toe   TOTAL KNEE ARTHROPLASTY Left 07/28/2020   Procedure: TOTAL KNEE ARTHROPLASTY;  Surgeon: Netta Cedars, MD;  Location: [facility];  Service: Orthopedics;  Laterality: Left;   VAGINAL HYSTERECTOMY  1998   W/ BILATERAL SALPINGOOPHECTOMY    ALLERGIES: No Known Allergies  FAMILY HISTORY: Family History  Problem Relation Age of Onset   Diabetes Mother    Diabetes Father    Kidney disease Father     Diabetes Brother     SOCIAL HISTORY: Social History   Tobacco Use   Smoking status: Former    Packs/day: 2.00    Years: 20.00    Total pack years: 40.00    Types: Cigarettes    Quit date: 01/19/1987    Years since quitting: 35.5   Smokeless tobacco: Never  Vaping Use   Vaping Use: Never used  Substance Use Topics   Alcohol use: No   Drug use: Never   Social History   Social History Narrative   Right handed   Lives with son one story home    Caffeine none     OBJECTIVE:  PHYSICAL EXAM: BP 113/68   Pulse 76   Ht _0  (1.626 m)   Wt 150 lb (68 kg)   SpO2 98%   BMI 25.75 kg/m   General: General appearance: Awake and alert. No distress. Cooperative with exam.  Skin: No obvious rash or jaundice. HEENT: Atraumatic. Anicteric. Lungs: Non-labored breathing on room air  Extremities: No edema. Surgical scar over left knee Musculoskeletal: No obvious joint swelling. Psych: Affect appropriate.  Neurological: Mental Status: Alert. Speech fluent. No pseudobulbar affect Cranial Nerves: CNII: No RAPD.  Visual fields intact. CNIII, IV, VI: PERRL. No nystagmus. EOMI. CN V: Facial sensation intact bilaterally to fine touch. CN VII: Facial muscles symmetric and strong. No ptosis at rest. CN VIII: Hears finger rub well bilaterally. CN IX: No hypophonia. CN X: Palate elevates symmetrically. CN XI: Full strength shoulder shrug bilaterally. CN XII: Tongue protrusion full and midline. No atrophy or fasciculations. No significant dysarthria Motor: Tone is normal. No fasciculations in extremities. No atrophy.  Individual muscle group testing (MRC grade out of 5):  Movement     Neck flexion 5    Neck extension 5     Right Left   Shoulder abduction 5 5   Shoulder adduction 5 5   Shoulder ext rotation 5 5   Shoulder int rotation 5 5   Elbow flexion 5 5   Elbow extension 5 5   Wrist extension 5 5    Wrist flexion 5 5   Finger abduction - FDI 5 5   Finger  abduction - ADM 5 5   Finger extension 5 5   Finger dist flex - 2/_0 Finger dist flex - 4/_1 Thumb flexion (FPL) 5 5   Thumb abduction (APB) 5 5   Hip flexion 5 5   Hip extension 5 5   Hip adduction 5 5   Hip abduction 5 5   Knee extension 5 5   Knee flexion 5 5   Dorsiflexion 5 5   Plantarflexion 5 5   Inversion 5 5   Eversion 5 5   Great toe extension 5 5   Great toe flexion 5 5     Reflexes:  Right Left   Bicep 2+ 2+   Tricep 2+ 2+   BrRad 2+ 2+   Knee 2+ 0  Knee replacement on left  Ankle 0 0    Pathological Reflexes: Babinski: Down going response bilaterally Hoffman: absent bilaterally Troemner: absent bilaterally Sensation: Pinprick: Intact in bilateral upper extremities, reduced to 50% in bilateral feet with improvement proximally above ankle Vibration: Intact in bilateral upper extremities. Reduced to 5 seconds at left great toe, 15 seconds at left ankle. Reduced to 7 seconds at right great toe, 15 seconds at right ankle Temperature: Intact in all extremities Proprioception: Intact in bilateral great toes. Coordination: Intact finger-to- nose-finger bilaterally. Romberg negative. Gait: Able to rise from chair with arms crossed unassisted. Normal, narrow-based gait. Able to tandem walk. Able to walk on toes and heels.  Lab and Test Review: HbA1c (04/25/22): 6.0  Component     Latest Ref Rng 04/25/2022  Glucose     70 - 99 mg/dL 72   BUN     8 - 27 mg/dL 16   Creatinine     0.57 - 1.00 mg/dL 0.89   eGFR     >59 mL/min/1.73 68   BUN/Creatinine Ratio     12 - 28  18   Sodium     134 - 144 mmol/L 143   Potassium     3.5 - 5.2 mmol/L 4.2   Chloride     96 - 106 mmol/L 103   CO2     20 - 29 mmol/L 26   Calcium     8.7 - 10.3 mg/dL 9.4   Total Protein     6.0 - 8.5 g/dL 7.2   Albumin     3.7 - 4.7 g/dL 4.4   Globulin, Total     1.5 -  4.5 g/dL 2.8   Albumin/Globulin Ratio     1.2 - 2.2  1.6   Total Bilirubin     0.0 - 1.2 mg/dL <0.2    Alkaline Phosphatase     44 - 121 IU/L 85   AST     0 - 40 IU/L 15   ALT     0 - 32 IU/L 12   WBC     3.4 - 10.8 x10E3/uL 7.0   RBC     3.77 - 5.28 x10E6/uL 4.43   Hemoglobin     11.1 - 15.9 g/dL 12.0   HCT     34.0 - 46.6 % 37.0   MCV     79 - 97 fL 84   MCH     26.6 - 33.0 pg 27.1   MCHC     31.5 - 35.7 g/dL 32.4   RDW     11.7 - 15.4 % 13.1   Platelets     150 - 450 x10E3/uL 333   Cholesterol, Total     100 - 199 mg/dL 200 (H)   Triglycerides     0 - 149 mg/dL 114   HDL Cholesterol     >39 mg/dL 78   VLDL Cholesterol Cal     5 - 40 mg/dL 20   LDL Chol Calc (NIH)     0 - 99 mg/dL 102 (H)   Total CHOL/HDL Ratio     0.0 - 4.4 ratio 2.6   Hemoglobin A1C     4.8 - 5.6 % 6.0 (H)   Est. average glucose Bld gHb Est-mCnc     mg/dL 126   Vitamin B12 (06/15/20)     232 - 1,245 pg/mL 1370  TSH (06/15/20)     0.450 - 4.500 uIU/mL 1.640      ASSESSMENT: Catrina Fellenz is a 76 y.o. female who presents for evaluation of numbness and tingling in bilateral feet. She has a relevant medical history of pre-diabetes, OA s/p left knee replacement, vit D deficiency, breast cancer (no history of chemotherapy), and HTN. Her neurological examination is pertinent for intact strength, but areflexia at bilateral ankles and loss of sensation to vibration and pin prick in bilateral feet. Available diagnostic data is significant for HbA1c of 6.0. Overall, symptoms are most consistent with a mild distal, symmetric, generalized polyneuropathy. The most likely etiology is prediabetes, which I discussed with the patient. I will evaluate for other treatable causes as below. We discussed that an EMG could be pursued, but would likely not alter management currently. We discussed over the counter measures vs prescription medication (such as gabapentin). Patient would prefer to start with over the counter medication for now.  PLAN: -Blood work (tier 1 neuropathy labs): B12, SPEP, IFE -Lidocaine  cream, over the counter, applied to feet as needed for tingling  -Return to clinic 6 months  The impression above as well as the plan as outlined below were extensively discussed with the patient who voiced understanding. All questions were answered to their satisfaction.  When available, results of the above investigations and possible further recommendations will be communicated to the patient via telephone/MyChart. Patient to call office if not contacted after expected testing turnaround time.   Thank you for allowing me to participate in patient's care.  If I can answer any additional questions, I would be pleased to do so.  Kai Levins, MD   CC: [address], [place] [address]  CC: Referring provider: Landis Martins, DPM [address], [place] [address] Phone: [phone]  Fax: [phone]

## 2022-07-23 ENCOUNTER — Other Ambulatory Visit: Payer: Self-pay | Admitting: Nurse Practitioner

## 2022-07-23 DIAGNOSIS — Z1231 Encounter for screening mammogram for malignant neoplasm of breast: Secondary | ICD-10-CM

## 2022-07-24 ENCOUNTER — Encounter: Payer: Self-pay | Admitting: Neurology

## 2022-07-24 ENCOUNTER — Other Ambulatory Visit (INDEPENDENT_AMBULATORY_CARE_PROVIDER_SITE_OTHER): Payer: Medicare HMO

## 2022-07-24 ENCOUNTER — Ambulatory Visit: Payer: Medicare HMO | Admitting: Neurology

## 2022-07-24 VITALS — BP 113/68 | HR 76 | Ht 64.0 in | Wt 150.0 lb

## 2022-07-24 DIAGNOSIS — R7303 Prediabetes: Secondary | ICD-10-CM

## 2022-07-24 DIAGNOSIS — E1142 Type 2 diabetes mellitus with diabetic polyneuropathy: Secondary | ICD-10-CM

## 2022-07-24 DIAGNOSIS — R209 Unspecified disturbances of skin sensation: Secondary | ICD-10-CM

## 2022-07-24 NOTE — Patient Instructions (Signed)
You were seen today for numbness and tingling in your toes. Based on your story and examination, the cause of your symptoms is peripheral neuropathy. The likely cause for this is pre-diabetes, but we will look for other causes. To further evaluate, I would like to do the following: B12 Serum electrophoresis (SPEP) and immunofixation (IFE)  I will be in touch about the results by MyChart.  To treat, we agreed on the following: -Lidocaine cream (1%), over the counter. You can get this at any drug store.  I would like to see you back in clinic in 6 months or sooner if needed.  Please let me know if you have any questions or concerns in the meantime.  The physicians and staff at [facility] are committed to providing excellent care. You may receive a survey requesting feedback about your experience at our office. We strive to receive "very good" responses to the survey questions. If you feel that your experience would prevent you from giving the office a "very good " response, please contact our office to try to remedy the situation. We may be reached at [phone]. Thank you for taking the time out of your busy day to complete the survey.  Kai Levins, MD

## 2022-07-25 ENCOUNTER — Telehealth: Payer: Self-pay | Admitting: *Deleted

## 2022-07-25 ENCOUNTER — Encounter: Payer: Self-pay | Admitting: Neurology

## 2022-07-25 LAB — VITAMIN B12: Vitamin B-12: 721 pg/mL (ref 211–911)

## 2022-07-28 LAB — PROTEIN ELECTROPHORESIS, SERUM
Albumin ELP: 4.3 g/dL (ref 3.8–4.8)
Alpha 1: 0.3 g/dL (ref 0.2–0.3)
Alpha 2: 0.7 g/dL (ref 0.5–0.9)
Beta 2: 0.4 g/dL (ref 0.2–0.5)
Beta Globulin: 0.5 g/dL (ref 0.4–0.6)
Gamma Globulin: 1.1 g/dL (ref 0.8–1.7)
Total Protein: 7.2 g/dL (ref 6.1–8.1)

## 2022-07-28 LAB — IMMUNOFIXATION ELECTROPHORESIS
IgG (Immunoglobin G), Serum: 1175 mg/dL (ref 600–1540)
IgM, Serum: 102 mg/dL (ref 50–300)
Immunofix Electr Int: NOT DETECTED
Immunoglobulin A: 238 mg/dL (ref 70–320)

## 2022-07-29 ENCOUNTER — Encounter: Payer: Self-pay | Admitting: Neurology

## 2022-07-31 ENCOUNTER — Ambulatory Visit (INDEPENDENT_AMBULATORY_CARE_PROVIDER_SITE_OTHER): Payer: Medicare HMO | Admitting: Nurse Practitioner

## 2022-07-31 ENCOUNTER — Encounter: Payer: Self-pay | Admitting: Nurse Practitioner

## 2022-07-31 VITALS — BP 100/60 | HR 81 | Temp 98.3°F | Ht 64.0 in | Wt 147.2 lb

## 2022-07-31 DIAGNOSIS — E1142 Type 2 diabetes mellitus with diabetic polyneuropathy: Secondary | ICD-10-CM

## 2022-07-31 DIAGNOSIS — E119 Type 2 diabetes mellitus without complications: Secondary | ICD-10-CM | POA: Diagnosis not present

## 2022-07-31 DIAGNOSIS — I10 Essential (primary) hypertension: Secondary | ICD-10-CM

## 2022-07-31 DIAGNOSIS — E7800 Pure hypercholesterolemia, unspecified: Secondary | ICD-10-CM | POA: Diagnosis not present

## 2022-07-31 MED ORDER — ONETOUCH DELICA PLUS LANCET33G MISC: 3 refills | Status: DC

## 2022-07-31 MED ORDER — ONETOUCH DELICA PLUS LANCET33G MISC: 3 refills | Status: AC

## 2022-07-31 MED ORDER — ONETOUCH VERIO VI STRP: ORAL_STRIP | 3 refills | Status: DC

## 2022-07-31 NOTE — Patient Instructions (Signed)
Hypertension, Adult ?Hypertension is another name for high blood pressure. High blood pressure forces your heart to work harder to pump blood. This can cause problems over time. ?There are two numbers in a blood pressure reading. There is a top number (systolic) over a bottom number (diastolic). It is best to have a blood pressure that is below 120/80. ?What are the causes? ?The cause of this condition is not known. Some other conditions can lead to high blood pressure. ?What increases the risk? ?Some lifestyle factors can make you more likely to develop high blood pressure: ?Smoking. ?Not getting enough exercise or physical activity. ?Being overweight. ?Having too much fat, sugar, calories, or salt (sodium) in your diet. ?Drinking too much alcohol. ?Other risk factors include: ?Having any of these conditions: ?Heart disease. ?Diabetes. ?High cholesterol. ?Kidney disease. ?Obstructive sleep apnea. ?Having a family history of high blood pressure and high cholesterol. ?Age. The risk increases with age. ?Stress. ?What are the signs or symptoms? ?High blood pressure may not cause symptoms. Very high blood pressure (hypertensive crisis) may cause: ?Headache. ?Fast or uneven heartbeats (palpitations). ?Shortness of breath. ?Nosebleed. ?Vomiting or feeling like you may vomit (nauseous). ?Changes in how you see. ?Very bad chest pain. ?Feeling dizzy. ?Seizures. ?How is this treated? ?This condition is treated by making healthy lifestyle changes, such as: ?Eating healthy foods. ?Exercising more. ?Drinking less alcohol. ?Your doctor may prescribe medicine if lifestyle changes do not help enough and if: ?Your top number is above 130. ?Your bottom number is above 80. ?Your personal target blood pressure may vary. ?Follow these instructions at home: ?Eating and drinking ? ?If told, follow the DASH eating plan. To follow this plan: ?Fill one half of your plate at each meal with fruits and vegetables. ?Fill one fourth of your plate  at each meal with whole grains. Whole grains include whole-wheat pasta, brown rice, and whole-grain bread. ?Eat or drink low-fat dairy products, such as skim milk or low-fat yogurt. ?Fill one fourth of your plate at each meal with low-fat (lean) proteins. Low-fat proteins include fish, chicken without skin, eggs, beans, and tofu. ?Avoid fatty meat, cured and processed meat, or chicken with skin. ?Avoid pre-made or processed food. ?Limit the amount of salt in your diet to less than 1,500 mg each day. ?Do not drink alcohol if: ?Your doctor tells you not to drink. ?You are pregnant, may be pregnant, or are planning to become pregnant. ?If you drink alcohol: ?Limit how much you have to: ?0-1 drink a day for women. ?0-2 drinks a day for men. ?Know how much alcohol is in your drink. In the [place] one drink equals one 12 oz bottle of beer (355 mL), one 5 oz glass of wine (148 mL), or one 1? oz glass of hard liquor (44 mL). ?Lifestyle ? ?Work with your doctor to stay at a healthy weight or to lose weight. Ask your doctor what the best weight is for you. ?Get at least 30 minutes of exercise that causes your heart to beat faster (aerobic exercise) most days of the week. This may include walking, swimming, or biking. ?Get at least 30 minutes of exercise that strengthens your muscles (resistance exercise) at least 3 days a week. This may include lifting weights or doing Pilates. ?Do not smoke or use any products that contain nicotine or tobacco. If you need help quitting, ask your doctor. ?Check your blood pressure at home as told by your doctor. ?Keep all follow-up visits. ?Medicines ?Take over-the-counter and prescription medicines   only as told by your doctor. Follow directions carefully. ?Do not skip doses of blood pressure medicine. The medicine does not work as well if you skip doses. Skipping doses also puts you at risk for problems. ?Ask your doctor about side effects or reactions to medicines that you should watch  for. ?Contact a doctor if: ?You think you are having a reaction to the medicine you are taking. ?You have headaches that keep coming back. ?You feel dizzy. ?You have swelling in your ankles. ?You have trouble with your vision. ?Get help right away if: ?You get a very bad headache. ?You start to feel mixed up (confused). ?You feel weak or numb. ?You feel faint. ?You have very bad pain in your: ?Chest. ?Belly (abdomen). ?You vomit more than once. ?You have trouble breathing. ?These symptoms may be an emergency. Get help right away. Call 911. ?Do not wait to see if the symptoms will go away. ?Do not drive yourself to the hospital. ?Summary ?Hypertension is another name for high blood pressure. ?High blood pressure forces your heart to work harder to pump blood. ?For most people, a normal blood pressure is less than 120/80. ?Making healthy choices can help lower blood pressure. If your blood pressure does not get lower with healthy choices, you may need to take medicine. ?This information is not intended to replace advice given to you by your health care provider. Make sure you discuss any questions you have with your health care provider. ?Document Revised: 09/13/2021 Document Reviewed: 09/13/2021 ?Elsevier Patient Education ? 2023 [employer] ? ?

## 2022-07-31 NOTE — Progress Notes (Signed)
Barnet Glasgow Martin,acting as a Education administrator for Minette Brine, FNP.,have documented all relevant documentation on the behalf of Minette Brine, FNP,as directed by  Minette Brine, FNP while in the presence of Minette Brine, [place].    Subjective:     Patient ID: Yvonne Barron , female    DOB: [birth year] , 76 y.o.   MRN: [id number]   Chief Complaint  Patient presents with   Hypertension   Diabetes    HPI  Patient presents today for bp and dm check. Patient doesn't have any other issues today.Patient states compliance with medications.  BP Readings from Last 3 Encounters: 07/31/22 : 100/60 07/24/22 : 113/68 04/25/22 : 124/66  Wt Readings from Last 3 Encounters: 07/31/22 : 147 lb 3.2 oz (66.8 kg) 07/24/22 : 150 lb (68 kg) 04/25/22 : 147 lb (66.7 kg)      Hypertension This is a chronic problem. The current episode started more than 1 year ago. The problem has been gradually worsening since onset. The problem is controlled. Pertinent negatives include no anxiety, chest pain or palpitations. There are no compliance problems.   Diabetes She presents for her follow-up diabetic visit. Diabetes type: prediabetes. Her disease course has been stable. There are no hypoglycemic associated symptoms. There are no diabetic associated symptoms. Pertinent negatives for diabetes include no chest pain, no fatigue, no polydipsia, no polyphagia and no polyuria. There are no hypoglycemic complications. Symptoms are stable. There are no diabetic complications. Risk factors for coronary artery disease include post-menopausal. Current diabetic treatment includes oral agent (monotherapy). She is compliant with treatment all of the time. Her weight is stable. She is following a generally healthy diet. When asked about meal planning, she reported none. She has not had a previous visit with a dietitian. Exercise: two days a week - 45 minutes a day. There is no change in her home blood glucose trend. ( Blood sugar has  been ranging 98-110) An ACE inhibitor/angiotensin II receptor blocker is being taken. She does not see a podiatrist.Eye exam is not current (Appt on July 19th).     Past Medical History:  Diagnosis Date   Arthritis    knee Lt   History of breast cancer 2009--  INVASIVE DUCTAL IN SITU--  S/P MASTECTOMY   NO RECURRENCE   Hypertension    Pre-diabetes    Urethral caruncle      Family History  Problem Relation Age of Onset   Diabetes Mother    Diabetes Father    Kidney disease Father    Diabetes Brother      Current Outpatient Medications:    albuterol (VENTOLIN HFA) 108 (90 Base) MCG/ACT inhaler, Inhale 1-2 puffs into the lungs every 6 (six) hours as needed for wheezing or shortness of breath., Disp: 1 each, Rfl: 0   Artificial Tear Ointment (DRY EYES OP), Place 1 drop into both eyes daily as needed (Dry eyes). Thera tears, Disp: , Rfl:    calcium carbonate (OS-CAL - DOSED IN MG OF ELEMENTAL CALCIUM) 1250 (500 Ca) MG tablet, Take 1 tablet by mouth daily with breakfast. , Disp: , Rfl:    cholecalciferol (VITAMIN D) 1000 units tablet, Take 1,000 Units by mouth 2 (two) times daily., Disp: , Rfl:    hydrochlorothiazide (HYDRODIURIL) 12.5 MG tablet, Take 1 tablet by mouth once daily, Disp: 90 tablet, Rfl: 0   Polyethyl Glycol-Propyl Glycol (SYSTANE) 0.4-0.3 % SOLN, Place 1 drop into both eyes at bedtime., Disp: , Rfl:    UNABLE TO FIND, Med  Name: vitamins for eyes, Disp: , Rfl:    valACYclovir (VALTREX) 1000 MG tablet, Take 1,000 mg by mouth daily as needed (Cold sore)., Disp: , Rfl:    vitamin B-12 (CYANOCOBALAMIN) 500 MCG tablet, Take 500 mcg by mouth daily., Disp: , Rfl:    vitamin C (ASCORBIC ACID) 500 MG tablet, Take 500 mg by mouth daily., Disp: , Rfl:    benzonatate (TESSALON) 100 MG capsule, Take 1 capsule (100 mg total) by mouth every 8 (eight) hours. (Patient not taking: Reported on 07/24/2022), Disp: 21 capsule, Rfl: 0   glucose blood (ONETOUCH VERIO) test strip, Check blood sugar  daily, Disp: 100 each, Rfl: 3   Lancets (ONETOUCH DELICA PLUS [id number]) MISC, USE TO TEST BLOOD SUGAR DAILY, Disp: 100 each, Rfl: 3   No Known Allergies   Review of Systems  Constitutional: Negative.  Negative for fatigue.  HENT: Negative.    Eyes: Negative.   Respiratory: Negative.    Cardiovascular: Negative.  Negative for chest pain and palpitations.  Gastrointestinal: Negative.   Endocrine: Negative for polydipsia, polyphagia and polyuria.     Today's Vitals   07/31/22 1535  BP: 100/60  Pulse: 81  Temp: 98.3 F (36.8 C)  TempSrc: Oral  Weight: 147 lb 3.2 oz (66.8 kg)  Height: $Remove'5\' 4"'CTwlYCR$  (1.626 m)  PainSc: 0-No pain   Body mass index is 25.27 kg/m.  Wt Readings from Last 3 Encounters:  07/31/22 147 lb 3.2 oz (66.8 kg)  07/24/22 150 lb (68 kg)  04/25/22 147 lb (66.7 kg)     Objective:  Physical Exam Vitals reviewed.  Constitutional:      General: She is not in acute distress.    Appearance: Normal appearance.  Cardiovascular:     Rate and Rhythm: Normal rate and regular rhythm.     Pulses: Normal pulses.     Heart sounds: Normal heart sounds. No murmur heard. Pulmonary:     Effort: Pulmonary effort is normal. No respiratory distress.     Breath sounds: Normal breath sounds. No wheezing.  Chest:  Breasts:    Right: Normal.     Left: Tenderness present. No swelling or mass.  Neurological:     General: No focal deficit present.     Mental Status: She is alert and oriented to person, place, and time.     Cranial Nerves: No cranial nerve deficit.     Motor: No weakness.  Psychiatric:        Mood and Affect: Mood normal.        Behavior: Behavior normal.        Thought Content: Thought content normal.        Judgment: Judgment normal.         Assessment And Plan:     1. Type 2 diabetes mellitus with diabetic polyneuropathy, without long-term current use of insulin (HCC) Comments: HgbA1c is in prediabetes range, recent visit to Neurology reports neuropathy  likely related to diabetes history - glucose blood (ONETOUCH VERIO) test strip; Check blood sugar daily  Dispense: 100 each; Refill: 3 - Lancets (ONETOUCH DELICA PLUS [id number]) MISC; USE TO TEST BLOOD SUGAR DAILY  Dispense: 100 each; Refill: 3 - Hemoglobin A1c - Lipid panel - BMP8+eGFR  2. Essential hypertension Comments: Blood pressure is well controlled, Continue current medications.  - BMP8+eGFR  3. Elevated cholesterol Comments: Cholesterol slightly elevated, continue focusing on low fat diet - Lipid panel    Patient was given opportunity to ask questions. Patient verbalized understanding of  the plan and was able to repeat key elements of the plan. All questions were answered to their satisfaction.  Minette Brine, FNP   I, Minette Brine, FNP, have reviewed all documentation for this visit. The documentation on 07/31/22 for the exam, diagnosis, procedures, and orders are all accurate and complete.   IF YOU HAVE BEEN REFERRED TO A SPECIALIST, IT MAY TAKE 1-2 WEEKS TO SCHEDULE/PROCESS THE REFERRAL. IF YOU HAVE NOT HEARD FROM US/SPECIALIST IN TWO WEEKS, PLEASE GIVE [place] A CALL AT [phone].   THE PATIENT IS ENCOURAGED TO PRACTICE SOCIAL DISTANCING DUE TO THE COVID-19 PANDEMIC.

## 2022-08-01 LAB — LIPID PANEL
Chol/HDL Ratio: 2.6 ratio (ref 0.0–4.4)
Cholesterol, Total: 187 mg/dL (ref 100–199)
HDL: 73 mg/dL (ref >39)
LDL Chol Calc (NIH): 101 mg/dL — ABNORMAL HIGH (ref 0–99)
Triglycerides: 70 mg/dL (ref 0–149)
VLDL Cholesterol Cal: 13 mg/dL (ref 5–40)

## 2022-08-01 LAB — BMP8+EGFR
BUN/Creatinine Ratio: 21 (ref 12–28)
BUN: 19 mg/dL (ref 8–27)
CO2: 25 mmol/L (ref 20–29)
Calcium: 9.7 mg/dL (ref 8.7–10.3)
Chloride: 103 mmol/L (ref 96–106)
Creatinine, Ser: 0.9 mg/dL (ref 0.57–1.00)
Glucose: 92 mg/dL (ref 70–99)
Potassium: 4.3 mmol/L (ref 3.5–5.2)
Sodium: 143 mmol/L (ref 134–144)
eGFR: 67 mL/min/{1.73_m2} (ref >59)

## 2022-08-01 LAB — HEMOGLOBIN A1C
Est. average glucose Bld gHb Est-mCnc: 128 mg/dL
Hgb A1c MFr Bld: 6.1 % — ABNORMAL HIGH (ref 4.8–5.6)

## 2022-08-05 NOTE — Telephone Encounter (Signed)
error 

## 2022-08-08 ENCOUNTER — Ambulatory Visit
Admission: RE | Admit: 2022-08-08 | Discharge: 2022-08-08 | Disposition: A | Discharge status: Home / Self Care | Payer: Medicare HMO | Source: Ambulatory Visit | Attending: Nurse Practitioner | Admitting: Nurse Practitioner

## 2022-08-08 DIAGNOSIS — Z1231 Encounter for screening mammogram for malignant neoplasm of breast: Secondary | ICD-10-CM | POA: Diagnosis not present

## 2022-08-20 DIAGNOSIS — N951 Menopausal and female climacteric states: Secondary | ICD-10-CM | POA: Diagnosis not present

## 2022-08-20 DIAGNOSIS — Z01419 Encounter for gynecological examination (general) (routine) without abnormal findings: Secondary | ICD-10-CM | POA: Diagnosis not present

## 2022-08-20 DIAGNOSIS — R7303 Prediabetes: Secondary | ICD-10-CM | POA: Insufficient documentation

## 2022-08-20 DIAGNOSIS — Z853 Personal history of malignant neoplasm of breast: Secondary | ICD-10-CM | POA: Diagnosis not present

## 2022-09-10 ENCOUNTER — Ambulatory Visit: Payer: Medicare HMO

## 2022-10-13 ENCOUNTER — Ambulatory Visit (INDEPENDENT_AMBULATORY_CARE_PROVIDER_SITE_OTHER): Payer: Medicare HMO

## 2022-10-13 ENCOUNTER — Ambulatory Visit (HOSPITAL_COMMUNITY)
Admission: RE | Admit: 2022-10-13 | Discharge: 2022-10-13 | Disposition: A | Discharge status: Home / Self Care | Payer: Medicare HMO | Source: Ambulatory Visit | Attending: Family Medicine | Admitting: Family Medicine

## 2022-10-13 ENCOUNTER — Other Ambulatory Visit: Payer: Self-pay

## 2022-10-13 ENCOUNTER — Encounter (HOSPITAL_COMMUNITY): Payer: Self-pay

## 2022-10-13 VITALS — BP 143/82 | HR 73 | Temp 98.3°F | Resp 18

## 2022-10-13 DIAGNOSIS — M545 Low back pain, unspecified: Secondary | ICD-10-CM

## 2022-10-13 DIAGNOSIS — M5441 Lumbago with sciatica, right side: Secondary | ICD-10-CM

## 2022-10-13 DIAGNOSIS — M5136 Other intervertebral disc degeneration, lumbar region: Secondary | ICD-10-CM | POA: Diagnosis not present

## 2022-10-13 MED ORDER — KETOROLAC TROMETHAMINE 30 MG/ML IJ SOLN
INTRAMUSCULAR | Status: AC
  Filled 2022-10-13: qty 1

## 2022-10-13 MED ORDER — TRAMADOL HCL 50 MG PO TABS: 50.0000 mg | ORAL_TABLET | Freq: Four times a day (QID) | ORAL | 0 refills | Status: DC | PRN

## 2022-10-13 MED ORDER — KETOROLAC TROMETHAMINE 30 MG/ML IJ SOLN
30.0000 mg | Freq: Once | INTRAMUSCULAR | Status: AC
  Administered 2022-10-13: 30 mg via INTRAMUSCULAR

## 2022-10-13 NOTE — ED Provider Notes (Signed)
[facility]    CSN: [id number] Arrival date & time: 10/13/22  1400      History   Chief Complaint Chief Complaint  Patient presents with   Back Pain   Appointment    2:30 pm    HPI Yvonne Barron is a 76 y.o. female.    Back Pain  Here for right low back pain.  Is been bothering her since November 1, and it is radiating sometimes into her right buttock.  No fall or trauma.  No overuse.  No hematuria or dysuria or fever or chills or congestion or cough  No new bowel or bladder incontinence.  No muscle weakness.  She has had 1 episode of sciatica in the past, that improved with chiropractic care  Last EGFR was normal at 70 Past Medical History:  Diagnosis Date   Arthritis    knee Lt   History of breast cancer 2009--  INVASIVE DUCTAL IN SITU--  S/P MASTECTOMY   NO RECURRENCE   Hypertension    Pre-diabetes    Urethral caruncle     Patient Active Problem List   Diagnosis Date Noted   H/O total knee replacement, left 07/28/2020   Type 2 diabetes mellitus without complication, without long-term current use of insulin ([place]) 04/15/2019   History of arthroscopic procedure on shoulder 11/13/2018   Pain in right knee 11/13/2018   Abnormal glucose 09/14/2018   Vitamin D deficiency 08/29/2018   Cervicalgia 08/29/2018   Decreased estrogen level 08/29/2018   Urinary frequency 08/29/2018   Urethral caruncle 07/10/2012   PARESTHESIA 07/01/2008   Breast cancer in situ 12/10/2007   Essential hypertension 06/17/2007    Past Surgical History:  Procedure Laterality Date   CYSTOSCOPY WITH URETHRAL CARUNCLE  07/10/2012   Procedure: CYSTOSCOPY WITH URETHRAL CARUNCLE;  Surgeon: Claybon Jabs, MD;  Location: [facility];  Service: Urology;  Laterality: N/A;    EXCISION OF CARUNCLE    LEFT TOTAL MASTECTOMY/ LEFT AXILLARY SENTINEL LYMPH NODE BX  03-01-2008   INVASIVE  DUCTAL CARCINOMA IN SITU   LEFT WRIST SURG  2008   REPAIR NERVE    MASTECTOMY Left 2009   TOE SURGERY Right 2017   Great toe   TOTAL KNEE ARTHROPLASTY Left 07/28/2020   Procedure: TOTAL KNEE ARTHROPLASTY;  Surgeon: Netta Cedars, MD;  Location: [facility];  Service: Orthopedics;  Laterality: Left;   VAGINAL HYSTERECTOMY  1998   W/ BILATERAL SALPINGOOPHECTOMY    OB History   No obstetric history on file.      Home Medications    Prior to Admission medications   Medication Sig Start Date End Date Taking? Authorizing Provider  traMADol (ULTRAM) 50 MG tablet Take 1 tablet (50 mg total) by mouth every 6 (six) hours as needed (pain). 10/13/22  Yes Barrett Henle, MD  albuterol (VENTOLIN HFA) 108 (90 Base) MCG/ACT inhaler Inhale 1-2 puffs into the lungs every 6 (six) hours as needed for wheezing or shortness of breath. 12/07/21   Hazel Sams, PA-C  Artificial Tear Ointment (DRY EYES OP) Place 1 drop into both eyes daily as needed (Dry eyes). Thera tears    [provider]  calcium carbonate (OS-CAL - DOSED IN MG OF ELEMENTAL CALCIUM) 1250 (500 Ca) MG tablet Take 1 tablet by mouth daily with breakfast.     [provider]  cholecalciferol (VITAMIN D) 1000 units tablet Take 1,000 Units by mouth 2 (two) times daily.    [provider]  glucose blood (ONETOUCH VERIO) test strip Check blood sugar daily 07/31/22   Minette Brine, FNP  hydrochlorothiazide (HYDRODIURIL) 12.5 MG tablet Take 1 tablet by mouth once daily 07/15/22   Minette Brine, FNP  Lancets [facility] DELICA PLUS [id number]) MISC USE TO TEST BLOOD SUGAR DAILY 07/31/22   Minette Brine, FNP  Polyethyl Glycol-Propyl Glycol (SYSTANE) 0.4-0.3 % SOLN Place 1 drop into both eyes at bedtime.    [provider]  UNABLE TO FIND Med Name: vitamins for eyes    [provider]  valACYclovir (VALTREX) 1000 MG tablet Take 1,000 mg by mouth daily as needed (Cold sore).    [provider]  vitamin B-12 (CYANOCOBALAMIN) 500 MCG tablet Take 500 mcg by mouth daily.     [provider]  vitamin C (ASCORBIC ACID) 500 MG tablet Take 500 mg by mouth daily.    [provider]    Family History Family History  Problem Relation Age of Onset   Diabetes Mother    Diabetes Father    Kidney disease Father    Diabetes Brother     Social History Social History   Tobacco Use   Smoking status: Former    Packs/day: 2.00    Years: 20.00    Total pack years: 40.00    Types: Cigarettes    Quit date: 01/19/1987    Years since quitting: 35.7   Smokeless tobacco: Never  Vaping Use   Vaping Use: Never used  Substance Use Topics   Alcohol use: No   Drug use: Never     Allergies   Patient has no known allergies.   Review of Systems Review of Systems  Musculoskeletal:  Positive for back pain.     Physical Exam Triage Vital Signs ED Triage Vitals  Enc Vitals Group     BP 10/13/22 1448 (!) 143/82     Pulse Rate 10/13/22 1448 73     Resp 10/13/22 1448 18     Temp 10/13/22 1448 98.3 F (36.8 C)     Temp Source 10/13/22 1448 Oral     SpO2 10/13/22 1448 96 %     Weight --      Height --      Head Circumference --      Peak Flow --      Pain Score 10/13/22 1446 8     Pain Loc --      Pain Edu? --      Excl. in [place]? --    No data found.  Updated Vital Signs BP (!) 143/82 (BP Location: Right Arm)   Pulse 73   Temp 98.3 F (36.8 C) (Oral)   Resp 18   SpO2 96%   Visual Acuity Right Eye Distance:   Left Eye Distance:   Bilateral Distance:    Right Eye Near:   Left Eye Near:    Bilateral Near:     Physical Exam Vitals reviewed.  Constitutional:      General: She is not in acute distress.    Appearance: She is not ill-appearing, toxic-appearing or diaphoretic.  HENT:     Nose: Nose normal.     Mouth/Throat:     Mouth: Mucous membranes are moist.     Pharynx: No oropharyngeal exudate or posterior oropharyngeal erythema.  Eyes:     Extraocular Movements: Extraocular movements intact.     Conjunctiva/sclera:  Conjunctivae normal.     Pupils: Pupils are equal, round, and reactive to light.  Cardiovascular:  Rate and Rhythm: Normal rate and regular rhythm.     Heart sounds: No murmur heard. Pulmonary:     Effort: Pulmonary effort is normal.     Breath sounds: No stridor. No wheezing, rhonchi or rales.  Abdominal:     Palpations: Abdomen is soft.  Musculoskeletal:     Cervical back: Neck supple.     Comments: Straight leg raise causes pain into her right posterior thigh.  There is tenderness along the right lumbosacral region  Lymphadenopathy:     Cervical: No cervical adenopathy.  Skin:    Capillary Refill: Capillary refill takes less than 2 seconds.     Coloration: Skin is not jaundiced or pale.  Neurological:     General: No focal deficit present.     Mental Status: She is alert and oriented to person, place, and time.  Psychiatric:        Behavior: Behavior normal.      UC Treatments / Results  Labs (all labs ordered are listed, but only abnormal results are displayed) Labs Reviewed - No data to display  EKG   Radiology DG Lumbar Spine 2-3 Views  Result Date: 10/13/2022 CLINICAL DATA:  Acute back pain for 4 days. Initial encounter. EXAM: LUMBAR SPINE - 2-3 VIEW COMPARISON:  None Available. FINDINGS: Five non rib-bearing lumbar type vertebra are identified in normal alignment. There is no evidence of acute fracture or subluxation. Mild degenerative disc disease at L4-5 and L5-S1 noted. No other significant abnormalities noted. IMPRESSION: 1. No acute abnormality. 2. Mild degenerative disc disease at L4-5 and L5-S1. Electronically Signed   By: Margarette Canada M.D.   On: 10/13/2022 15:51    Procedures Procedures (including critical care time)  Medications Ordered in UC Medications  ketorolac (TORADOL) 30 MG/ML injection 30 mg (has no administration in time range)    Initial Impression / Assessment and Plan / UC Course  I have reviewed the triage vital signs and the nursing  notes.  Pertinent labs & imaging results that were available during my care of the patient were reviewed by me and considered in my medical decision making (see chart for details).        X-rays show some degenerative changes, but no acute bony abnormality.  We negotiated about pain relief.  Toradol injection is done here and tramadol is sent for pain relief.  Have asked her to follow-up with her primary care Final Clinical Impressions(s) / UC Diagnoses   Final diagnoses:  Acute right-sided low back pain with right-sided sciatica     Discharge Instructions      There were some degenerative changes on your x-rays, but no acute bony abnormalities like a broken bone  You have been given a shot of Toradol 30 mg today.  Take tramadol 50 mg-- 1 tablet every 6 hours as needed for pain.  This medication can make you sleepy or dizzy   Please make a follow-up appointment with your primary care about this issue     ED Prescriptions     Medication Sig Dispense Auth. Provider   traMADol (ULTRAM) 50 MG tablet Take 1 tablet (50 mg total) by mouth every 6 (six) hours as needed (pain). 12 tablet Altovise Wahler, Gwenlyn Perking, MD      I have reviewed the PDMP during this encounter.   Barrett Henle, MD 10/13/22 [phone]

## 2022-10-13 NOTE — Discharge Instructions (Addendum)
There were some degenerative changes on your x-rays, but no acute bony abnormalities like a broken bone  You have been given a shot of Toradol 30 mg today.  Take tramadol 50 mg-- 1 tablet every 6 hours as needed for pain.  This medication can make you sleepy or dizzy   Please make a follow-up appointment with your primary care about this issue

## 2022-10-13 NOTE — ED Triage Notes (Signed)
Back pain started Wednesday, intermittent and gradually worsened in severity.  History of the same.  Pain in right lower back and right buttocks.  .    Patient has used ointments, heating pad, back wrap for support.

## 2022-10-15 ENCOUNTER — Encounter: Payer: Self-pay | Admitting: Neurology

## 2022-11-05 ENCOUNTER — Encounter: Payer: Self-pay | Admitting: Nurse Practitioner

## 2022-11-06 ENCOUNTER — Other Ambulatory Visit: Payer: Self-pay

## 2022-11-06 DIAGNOSIS — E1142 Type 2 diabetes mellitus with diabetic polyneuropathy: Secondary | ICD-10-CM

## 2022-11-06 MED ORDER — ONETOUCH VERIO VI STRP: ORAL_STRIP | 3 refills | Status: DC

## 2022-11-20 ENCOUNTER — Ambulatory Visit: Payer: Medicare HMO | Admitting: Nurse Practitioner

## 2022-11-26 ENCOUNTER — Ambulatory Visit (INDEPENDENT_AMBULATORY_CARE_PROVIDER_SITE_OTHER): Payer: Medicare HMO | Admitting: Nurse Practitioner

## 2022-11-26 ENCOUNTER — Encounter: Payer: Self-pay | Admitting: Nurse Practitioner

## 2022-11-26 VITALS — BP 134/100 | HR 60 | Temp 98.1°F | Ht 63.0 in | Wt 148.2 lb

## 2022-11-26 DIAGNOSIS — E1342 Other specified diabetes mellitus with diabetic polyneuropathy: Secondary | ICD-10-CM

## 2022-11-26 DIAGNOSIS — M5442 Lumbago with sciatica, left side: Secondary | ICD-10-CM

## 2022-11-26 DIAGNOSIS — Z23 Encounter for immunization: Secondary | ICD-10-CM

## 2022-11-26 DIAGNOSIS — Z853 Personal history of malignant neoplasm of breast: Secondary | ICD-10-CM | POA: Diagnosis not present

## 2022-11-26 DIAGNOSIS — E7800 Pure hypercholesterolemia, unspecified: Secondary | ICD-10-CM

## 2022-11-26 DIAGNOSIS — I10 Essential (primary) hypertension: Secondary | ICD-10-CM

## 2022-11-26 DIAGNOSIS — Z9012 Acquired absence of left breast and nipple: Secondary | ICD-10-CM

## 2022-11-26 MED ORDER — AMLODIPINE BESYLATE 2.5 MG PO TABS: 2.5000 mg | ORAL_TABLET | Freq: Every day | ORAL | 2 refills | Status: DC

## 2022-11-26 NOTE — Progress Notes (Signed)
I,Victoria T Hamilton,acting as a Education administrator for Minette Brine, FNP.,have documented all relevant documentation on the behalf of Minette Brine, FNP,as directed by  Minette Brine, FNP while in the presence of Minette Brine, [place].    Subjective:     Patient ID: Yvonne Barron , female    DOB: 03/06/1946 , 76 y.o.   MRN: [id number]   Chief Complaint  Patient presents with   Diabetes   Hypertension    HPI  Patient presents today for bp and dm check. Patient doesn't have any other issues today. Patient states compliance with medications.  She has been checking her bp at home averaging around the same as today. She denies doing anything differently. She does admit to skipping some of her fluids .  She would like to have a MRI done on her left side. She has been diagnosed with sciatica, she does visit a chiropractor.   Every now and then she feels a pain almost like someone is stabbing her on her left side. She reports having weakness and fatigue. The pain is worsening. Once she is up moving around she is fine. She went to the ER on 10/13/2022 and had lumbar xray revealed Mild degenerative disc disease at L4-5 and L5-S1.        Hypertension This is a chronic problem. The current episode started more than 1 year ago. The problem has been gradually worsening since onset. The problem is controlled. Pertinent negatives include no anxiety, chest pain or palpitations. Risk factors for coronary artery disease include diabetes mellitus. Past treatments include diuretics. There are no compliance problems.   Diabetes She presents for her follow-up diabetic visit. Diabetes type: prediabetes. Her disease course has been stable. There are no hypoglycemic associated symptoms. Pertinent negatives for hypoglycemia include no dizziness. There are no diabetic associated symptoms. Pertinent negatives for diabetes include no chest pain, no fatigue, no polydipsia, no polyphagia, no polyuria and no weakness. There are  no hypoglycemic complications. Symptoms are stable. There are no diabetic complications. Risk factors for coronary artery disease include post-menopausal. Current diabetic treatment includes oral agent (monotherapy). She is compliant with treatment all of the time. Her weight is stable. She is following a generally healthy diet. When asked about meal planning, she reported none. She has not had a previous visit with a dietitian. Exercise: two days a week - 45 minutes a day. There is no change in her home blood glucose trend. ( Blood sugar has been ranging 98-110) An ACE inhibitor/angiotensin II receptor blocker is being taken. She does not see a podiatrist.Eye exam is not current (Appt on July 19th).     Past Medical History:  Diagnosis Date   Arthritis    knee Lt   History of breast cancer 2009--  INVASIVE DUCTAL IN SITU--  S/P MASTECTOMY   NO RECURRENCE   Hypertension    Pre-diabetes    Urethral caruncle      Family History  Problem Relation Age of Onset   Diabetes Mother    Diabetes Father    Kidney disease Father    Diabetes Brother      Current Outpatient Medications:    albuterol (VENTOLIN HFA) 108 (90 Base) MCG/ACT inhaler, Inhale 1-2 puffs into the lungs every 6 (six) hours as needed for wheezing or shortness of breath., Disp: 1 each, Rfl: 0   amLODipine (NORVASC) 2.5 MG tablet, Take 1 tablet (2.5 mg total) by mouth daily., Disp: 30 tablet, Rfl: 2   Artificial Tear Ointment (DRY  EYES OP), Place 1 drop into both eyes daily as needed (Dry eyes). Thera tears, Disp: , Rfl:    calcium carbonate (OS-CAL - DOSED IN MG OF ELEMENTAL CALCIUM) 1250 (500 Ca) MG tablet, Take 1 tablet by mouth daily with breakfast. , Disp: , Rfl:    cholecalciferol (VITAMIN D) 1000 units tablet, Take 1,000 Units by mouth 2 (two) times daily., Disp: , Rfl:    glucose blood (ONETOUCH VERIO) test strip, Check blood sugar daily, Disp: 100 each, Rfl: 3   Lancets (ONETOUCH DELICA PLUS [id number]) MISC, USE TO TEST  BLOOD SUGAR DAILY, Disp: 100 each, Rfl: 3   Polyethyl Glycol-Propyl Glycol (SYSTANE) 0.4-0.3 % SOLN, Place 1 drop into both eyes at bedtime., Disp: , Rfl:    UNABLE TO FIND, Med Name: vitamins for eyes, Disp: , Rfl:    valACYclovir (VALTREX) 1000 MG tablet, Take 1,000 mg by mouth daily as needed (Cold sore)., Disp: , Rfl:    vitamin B-12 (CYANOCOBALAMIN) 500 MCG tablet, Take 500 mcg by mouth daily., Disp: , Rfl:    vitamin C (ASCORBIC ACID) 500 MG tablet, Take 500 mg by mouth daily., Disp: , Rfl:    hydrochlorothiazide (HYDRODIURIL) 12.5 MG tablet, Take 1 tablet by mouth once daily, Disp: 90 tablet, Rfl: 0   traMADol (ULTRAM) 50 MG tablet, Take 1 tablet (50 mg total) by mouth every 6 (six) hours as needed (pain). (Patient not taking: Reported on 11/26/2022), Disp: 12 tablet, Rfl: 0   No Known Allergies   Review of Systems  Constitutional:  Negative for fatigue.  Respiratory: Negative.    Cardiovascular:  Negative for chest pain and palpitations.  Endocrine: Negative for polydipsia, polyphagia and polyuria.  Musculoskeletal: Negative.   Neurological:  Positive for numbness (to right thigh area). Negative for dizziness and weakness.  Psychiatric/Behavioral: Negative.       Today's Vitals   11/26/22 1224  BP: (!) 134/100  Pulse: 60  Temp: 98.1 F (36.7 C)  SpO2: 98%  Weight: 148 lb 3.2 oz (67.2 kg)  Height: _0  (1.6 m)   Body mass index is 26.25 kg/m.  Wt Readings from Last 3 Encounters:  11/26/22 148 lb 3.2 oz (67.2 kg)  07/31/22 147 lb 3.2 oz (66.8 kg)  07/24/22 150 lb (68 kg)    Objective:  Physical Exam Vitals reviewed.  Constitutional:      General: She is not in acute distress.    Appearance: Normal appearance.  Cardiovascular:     Rate and Rhythm: Normal rate and regular rhythm.     Pulses: Normal pulses.     Heart sounds: Normal heart sounds. No murmur heard. Pulmonary:     Effort: Pulmonary effort is normal. No respiratory distress.     Breath sounds: Normal  breath sounds.  Neurological:     Mental Status: She is alert.         Assessment And Plan:     1. Diabetic polyneuropathy associated with other specified diabetes mellitus ([place]) Comments: Diabetic foot exam done with decreased sensation bilaterally. Continue current medications - CMP14+EGFR - Hemoglobin A1c  2. Essential hypertension Comments: We question blood pressure is elevated today, will start on amlodipine 2.5 mg daily.  Discussed side effects - CMP14+EGFR  3. Elevated cholesterol Comments: Continue diet control, encouraged to eat low-fat diet. - Lipid panel  4. History of left mastectomy  5. History of breast cancer  6. Need for influenza vaccination Influenza vaccine administered Encouraged to take Tylenol as needed for fever or  muscle aches. - Flu Vaccine QUAD High Dose(Fluad)  7. Acute bilateral low back pain with left-sided sciatica Comments: She is requesting an MRI due to sharp pain down her leg. - MR Lumbar Spine W Wo Contrast; Future     Patient was given opportunity to ask questions. Patient verbalized understanding of the plan and was able to repeat key elements of the plan. All questions were answered to their satisfaction.  Minette Brine, FNP   I, Minette Brine, FNP, have reviewed all documentation for this visit. The documentation on 11/26/22 for the exam, diagnosis, procedures, and orders are all accurate and complete.   IF YOU HAVE BEEN REFERRED TO A SPECIALIST, IT MAY TAKE 1-2 WEEKS TO SCHEDULE/PROCESS THE REFERRAL. IF YOU HAVE NOT HEARD FROM US/SPECIALIST IN TWO WEEKS, PLEASE GIVE [place] A CALL AT [phone].   THE PATIENT IS ENCOURAGED TO PRACTICE SOCIAL DISTANCING DUE TO THE COVID-19 PANDEMIC.

## 2022-11-26 NOTE — Patient Instructions (Signed)
Hypertension, Adult ?Hypertension is another name for high blood pressure. High blood pressure forces your heart to work harder to pump blood. This can cause problems over time. ?There are two numbers in a blood pressure reading. There is a top number (systolic) over a bottom number (diastolic). It is best to have a blood pressure that is below 120/80. ?What are the causes? ?The cause of this condition is not known. Some other conditions can lead to high blood pressure. ?What increases the risk? ?Some lifestyle factors can make you more likely to develop high blood pressure: ?Smoking. ?Not getting enough exercise or physical activity. ?Being overweight. ?Having too much fat, sugar, calories, or salt (sodium) in your diet. ?Drinking too much alcohol. ?Other risk factors include: ?Having any of these conditions: ?Heart disease. ?Diabetes. ?High cholesterol. ?Kidney disease. ?Obstructive sleep apnea. ?Having a family history of high blood pressure and high cholesterol. ?Age. The risk increases with age. ?Stress. ?What are the signs or symptoms? ?High blood pressure may not cause symptoms. Very high blood pressure (hypertensive crisis) may cause: ?Headache. ?Fast or uneven heartbeats (palpitations). ?Shortness of breath. ?Nosebleed. ?Vomiting or feeling like you may vomit (nauseous). ?Changes in how you see. ?Very bad chest pain. ?Feeling dizzy. ?Seizures. ?How is this treated? ?This condition is treated by making healthy lifestyle changes, such as: ?Eating healthy foods. ?Exercising more. ?Drinking less alcohol. ?Your doctor may prescribe medicine if lifestyle changes do not help enough and if: ?Your top number is above 130. ?Your bottom number is above 80. ?Your personal target blood pressure may vary. ?Follow these instructions at home: ?Eating and drinking ? ?If told, follow the DASH eating plan. To follow this plan: ?Fill one half of your plate at each meal with fruits and vegetables. ?Fill one fourth of your plate  at each meal with whole grains. Whole grains include whole-wheat pasta, brown rice, and whole-grain bread. ?Eat or drink low-fat dairy products, such as skim milk or low-fat yogurt. ?Fill one fourth of your plate at each meal with low-fat (lean) proteins. Low-fat proteins include fish, chicken without skin, eggs, beans, and tofu. ?Avoid fatty meat, cured and processed meat, or chicken with skin. ?Avoid pre-made or processed food. ?Limit the amount of salt in your diet to less than 1,500 mg each day. ?Do not drink alcohol if: ?Your doctor tells you not to drink. ?You are pregnant, may be pregnant, or are planning to become pregnant. ?If you drink alcohol: ?Limit how much you have to: ?0-1 drink a day for women. ?0-2 drinks a day for men. ?Know how much alcohol is in your drink. In the [place] one drink equals one 12 oz bottle of beer (355 mL), one 5 oz glass of wine (148 mL), or one 1? oz glass of hard liquor (44 mL). ?Lifestyle ? ?Work with your doctor to stay at a healthy weight or to lose weight. Ask your doctor what the best weight is for you. ?Get at least 30 minutes of exercise that causes your heart to beat faster (aerobic exercise) most days of the week. This may include walking, swimming, or biking. ?Get at least 30 minutes of exercise that strengthens your muscles (resistance exercise) at least 3 days a week. This may include lifting weights or doing Pilates. ?Do not smoke or use any products that contain nicotine or tobacco. If you need help quitting, ask your doctor. ?Check your blood pressure at home as told by your doctor. ?Keep all follow-up visits. ?Medicines ?Take over-the-counter and prescription medicines   only as told by your doctor. Follow directions carefully. ?Do not skip doses of blood pressure medicine. The medicine does not work as well if you skip doses. Skipping doses also puts you at risk for problems. ?Ask your doctor about side effects or reactions to medicines that you should watch  for. ?Contact a doctor if: ?You think you are having a reaction to the medicine you are taking. ?You have headaches that keep coming back. ?You feel dizzy. ?You have swelling in your ankles. ?You have trouble with your vision. ?Get help right away if: ?You get a very bad headache. ?You start to feel mixed up (confused). ?You feel weak or numb. ?You feel faint. ?You have very bad pain in your: ?Chest. ?Belly (abdomen). ?You vomit more than once. ?You have trouble breathing. ?These symptoms may be an emergency. Get help right away. Call 911. ?Do not wait to see if the symptoms will go away. ?Do not drive yourself to the hospital. ?Summary ?Hypertension is another name for high blood pressure. ?High blood pressure forces your heart to work harder to pump blood. ?For most people, a normal blood pressure is less than 120/80. ?Making healthy choices can help lower blood pressure. If your blood pressure does not get lower with healthy choices, you may need to take medicine. ?This information is not intended to replace advice given to you by your health care provider. Make sure you discuss any questions you have with your health care provider. ?Document Revised: 09/13/2021 Document Reviewed: 09/13/2021 ?Elsevier Patient Education ? 2023 [employer] ? ?

## 2022-11-27 LAB — CMP14+EGFR
ALT: 10 IU/L (ref 0–32)
AST: 13 IU/L (ref 0–40)
Albumin/Globulin Ratio: 1.8 (ref 1.2–2.2)
Albumin: 4.6 g/dL (ref 3.8–4.8)
Alkaline Phosphatase: 84 IU/L (ref 44–121)
BUN/Creatinine Ratio: 18 (ref 12–28)
BUN: 11 mg/dL (ref 8–27)
Bilirubin Total: 0.3 mg/dL (ref 0.0–1.2)
CO2: 25 mmol/L (ref 20–29)
Calcium: 9.6 mg/dL (ref 8.7–10.3)
Chloride: 99 mmol/L (ref 96–106)
Creatinine, Ser: 0.62 mg/dL (ref 0.57–1.00)
Globulin, Total: 2.6 g/dL (ref 1.5–4.5)
Glucose: 92 mg/dL (ref 70–99)
Potassium: 4.2 mmol/L (ref 3.5–5.2)
Sodium: 140 mmol/L (ref 134–144)
Total Protein: 7.2 g/dL (ref 6.0–8.5)
eGFR: 93 mL/min/{1.73_m2} (ref >59)

## 2022-11-27 LAB — HEMOGLOBIN A1C
Est. average glucose Bld gHb Est-mCnc: 131 mg/dL
Hgb A1c MFr Bld: 6.2 % — ABNORMAL HIGH (ref 4.8–5.6)

## 2022-11-27 LAB — LIPID PANEL
Chol/HDL Ratio: 2.6 ratio (ref 0.0–4.4)
Cholesterol, Total: 200 mg/dL — ABNORMAL HIGH (ref 100–199)
HDL: 78 mg/dL (ref >39)
LDL Chol Calc (NIH): 115 mg/dL — ABNORMAL HIGH (ref 0–99)
Triglycerides: 39 mg/dL (ref 0–149)
VLDL Cholesterol Cal: 7 mg/dL (ref 5–40)

## 2022-11-28 ENCOUNTER — Other Ambulatory Visit: Payer: Self-pay | Admitting: Nurse Practitioner

## 2022-11-28 DIAGNOSIS — I10 Essential (primary) hypertension: Secondary | ICD-10-CM

## 2022-12-10 ENCOUNTER — Ambulatory Visit: Payer: Medicare HMO

## 2022-12-10 VITALS — BP 112/58 | HR 74 | Temp 98.3°F

## 2022-12-10 DIAGNOSIS — I10 Essential (primary) hypertension: Secondary | ICD-10-CM

## 2022-12-10 MED ORDER — TETANUS-DIPHTH-ACELL PERTUSSIS 5-2.5-18.5 LF-MCG/0.5 IM SUSP: 0.5000 mL | Freq: Once | INTRAMUSCULAR | 0 refills | Status: DC

## 2022-12-10 NOTE — Patient Instructions (Signed)
Hypertension, Adult High blood pressure (hypertension) is when the force of blood pumping through the arteries is too strong. The arteries are the blood vessels that carry blood from the heart throughout the body. Hypertension forces the heart to work harder to pump blood and may cause arteries to become narrow or stiff. Untreated or uncontrolled hypertension can lead to a heart attack, heart failure, a stroke, kidney disease, and other problems. A blood pressure reading consists of a higher number over a lower number. Ideally, your blood pressure should be below 120/80. The first ("top") number is called the systolic pressure. It is a measure of the pressure in your arteries as your heart beats. The second ("bottom") number is called the diastolic pressure. It is a measure of the pressure in your arteries as the heart relaxes. What are the causes? The exact cause of this condition is not known. There are some conditions that result in high blood pressure. What increases the risk? Certain factors may make you more likely to develop high blood pressure. Some of these risk factors are under your control, including: Smoking. Not getting enough exercise or physical activity. Being overweight. Having too much fat, sugar, calories, or salt (sodium) in your diet. Drinking too much alcohol. Other risk factors include: Having a personal history of heart disease, diabetes, high cholesterol, or kidney disease. Stress. Having a family history of high blood pressure and high cholesterol. Having obstructive sleep apnea. Age. The risk increases with age. What are the signs or symptoms? High blood pressure may not cause symptoms. Very high blood pressure (hypertensive crisis) may cause: Headache. Fast or irregular heartbeats (palpitations). Shortness of breath. Nosebleed. Nausea and vomiting. Vision changes. Severe chest pain, dizziness, and seizures. How is this diagnosed? This condition is diagnosed by  measuring your blood pressure while you are seated, with your arm resting on a flat surface, your legs uncrossed, and your feet flat on the floor. The cuff of the blood pressure monitor will be placed directly against the skin of your upper arm at the level of your heart. Blood pressure should be measured at least twice using the same arm. Certain conditions can cause a difference in blood pressure between your right and left arms. If you have a high blood pressure reading during one visit or you have normal blood pressure with other risk factors, you may be asked to: Return on a different day to have your blood pressure checked again. Monitor your blood pressure at home for 1 week or longer. If you are diagnosed with hypertension, you may have other blood or imaging tests to help your health care provider understand your overall risk for other conditions. How is this treated? This condition is treated by making healthy lifestyle changes, such as eating healthy foods, exercising more, and reducing your alcohol intake. You may be referred for counseling on a healthy diet and physical activity. Your health care provider may prescribe medicine if lifestyle changes are not enough to get your blood pressure under control and if: Your systolic blood pressure is above 130. Your diastolic blood pressure is above 80. Your personal target blood pressure may vary depending on your medical conditions, your age, and other factors. Follow these instructions at home: Eating and drinking  Eat a diet that is high in fiber and potassium, and low in sodium, added sugar, and fat. An example of this eating plan is called the DASH diet. DASH stands for Dietary Approaches to Stop Hypertension. To eat this way: Eat   plenty of fresh fruits and vegetables. Try to fill one half of your plate at each meal with fruits and vegetables. Eat whole grains, such as whole-wheat pasta, brown rice, or whole-grain bread. Fill about one  fourth of your plate with whole grains. Eat or drink low-fat dairy products, such as skim milk or low-fat yogurt. Avoid fatty cuts of meat, processed or cured meats, and poultry with skin. Fill about one fourth of your plate with lean proteins, such as fish, chicken without skin, beans, eggs, or tofu. Avoid pre-made and processed foods. These tend to be higher in sodium, added sugar, and fat. Reduce your daily sodium intake. Many people with hypertension should eat less than 1,500 mg of sodium a day. Do not drink alcohol if: Your health care provider tells you not to drink. You are pregnant, may be pregnant, or are planning to become pregnant. If you drink alcohol: Limit how much you have to: 0-1 drink a day for women. 0-2 drinks a day for men. Know how much alcohol is in your drink. In the [place] one drink equals one 12 oz bottle of beer (355 mL), one 5 oz glass of wine (148 mL), or one 1 oz glass of hard liquor (44 mL). Lifestyle  Work with your health care provider to maintain a healthy body weight or to lose weight. Ask what an ideal weight is for you. Get at least 30 minutes of exercise that causes your heart to beat faster (aerobic exercise) most days of the week. Activities may include walking, swimming, or biking. Include exercise to strengthen your muscles (resistance exercise), such as Pilates or lifting weights, as part of your weekly exercise routine. Try to do these types of exercises for 30 minutes at least 3 days a week. Do not use any products that contain nicotine or tobacco. These products include cigarettes, chewing tobacco, and vaping devices, such as e-cigarettes. If you need help quitting, ask your health care provider. Monitor your blood pressure at home as told by your health care provider. Keep all follow-up visits. This is important. Medicines Take over-the-counter and prescription medicines only as told by your health care provider. Follow directions carefully. Blood  pressure medicines must be taken as prescribed. Do not skip doses of blood pressure medicine. Doing this puts you at risk for problems and can make the medicine less effective. Ask your health care provider about side effects or reactions to medicines that you should watch for. Contact a health care provider if you: Think you are having a reaction to a medicine you are taking. Have headaches that keep coming back (recurring). Feel dizzy. Have swelling in your ankles. Have trouble with your vision. Get help right away if you: Develop a severe headache or confusion. Have unusual weakness or numbness. Feel faint. Have severe pain in your chest or abdomen. Vomit repeatedly. Have trouble breathing. These symptoms may be an emergency. Get help right away. Call 911. Do not wait to see if the symptoms will go away. Do not drive yourself to the hospital. Summary Hypertension is when the force of blood pumping through your arteries is too strong. If this condition is not controlled, it may put you at risk for serious complications. Your personal target blood pressure may vary depending on your medical conditions, your age, and other factors. For most people, a normal blood pressure is less than 120/80. Hypertension is treated with lifestyle changes, medicines, or a combination of both. Lifestyle changes include losing weight, eating a healthy,   low-sodium diet, exercising more, and limiting alcohol. This information is not intended to replace advice given to you by your health care provider. Make sure you discuss any questions you have with your health care provider. Document Revised: 10/02/2021 Document Reviewed: 10/02/2021 Elsevier Patient Education  2023 Elsevier Inc.  

## 2022-12-10 NOTE — Progress Notes (Signed)
Patient presents today for BPC. She is currently taking amlodipine 2.'5mg'$  and  hctz 12.5.   BP Readings from Last 3 Encounters:  12/10/22 (!) 112/58  11/26/22 (!) 134/100  10/13/22 (!) 143/82   Patient will continue with current medications and follow up with provider as scheduled. Pt declined tdap at this time. States that she will get it at next visit.

## 2022-12-26 ENCOUNTER — Other Ambulatory Visit: Payer: Medicare HMO

## 2022-12-27 ENCOUNTER — Encounter: Payer: Self-pay | Admitting: Nurse Practitioner

## 2023-01-03 ENCOUNTER — Ambulatory Visit
Admission: RE | Admit: 2023-01-03 | Discharge: 2023-01-03 | Disposition: A | Discharge status: Home / Self Care | Payer: Medicare HMO | Source: Ambulatory Visit | Attending: Internal Medicine | Admitting: Internal Medicine

## 2023-01-03 ENCOUNTER — Ambulatory Visit (INDEPENDENT_AMBULATORY_CARE_PROVIDER_SITE_OTHER): Payer: Medicare HMO

## 2023-01-03 VITALS — BP 131/81 | HR 74 | Temp 98.1°F | Resp 16

## 2023-01-03 DIAGNOSIS — Z1152 Encounter for screening for COVID-19: Secondary | ICD-10-CM | POA: Insufficient documentation

## 2023-01-03 DIAGNOSIS — Z87891 Personal history of nicotine dependence: Secondary | ICD-10-CM | POA: Insufficient documentation

## 2023-01-03 DIAGNOSIS — J069 Acute upper respiratory infection, unspecified: Secondary | ICD-10-CM | POA: Diagnosis not present

## 2023-01-03 DIAGNOSIS — R059 Cough, unspecified: Secondary | ICD-10-CM | POA: Diagnosis not present

## 2023-01-03 MED ORDER — BENZONATATE 100 MG PO CAPS: 100.0000 mg | ORAL_CAPSULE | Freq: Three times a day (TID) | ORAL | 0 refills | Status: DC | PRN

## 2023-01-03 MED ORDER — FLUTICASONE PROPIONATE 50 MCG/ACT NA SUSP: 1.0000 | Freq: Every day | NASAL | 0 refills | Status: DC

## 2023-01-03 NOTE — ED Triage Notes (Signed)
Patient presents to UC for cough x 3 days. Cough worse at night. Taking honey and green tea.   Denies fever.

## 2023-01-03 NOTE — ED Provider Notes (Signed)
[facility]    CSN: [id number] Arrival date & time: 01/03/23  1205      History   Chief Complaint Chief Complaint  Patient presents with   Cough    No temperature. My chest hurts when coughing and throat is scratchy. Nose running and hard to get my breath when coughing. Please advise. - Entered by patient    HPI Yvonne Barron is a 77 y.o. female.   Patient presents with productive cough and nasal congestion that has been present for 3 days.  Patient also reports that she has had some blood-tinged sputum.  Denies any known fevers or sick contacts.  Denies chest pain, shortness of breath, sore throat, ear pain, nausea, vomiting, diarrhea, abdominal pain.  Patient has been taking honey and green tea with minimal improvement.  Denies history of asthma or COPD.  Patient does not currently smoke cigarettes but reports that she has a history of smoking cigarettes approximately 30 years ago.   Cough   Past Medical History:  Diagnosis Date   Arthritis    knee Lt   History of breast cancer 2009--  INVASIVE DUCTAL IN SITU--  S/P MASTECTOMY   NO RECURRENCE   Hypertension    Pre-diabetes    Urethral caruncle     Patient Active Problem List   Diagnosis Date Noted   H/O total knee replacement, left 07/28/2020   Type 2 diabetes mellitus without complication, without long-term current use of insulin ([place]) 04/15/2019   History of arthroscopic procedure on shoulder 11/13/2018   Pain in right knee 11/13/2018   Abnormal glucose 09/14/2018   Vitamin D deficiency 08/29/2018   Cervicalgia 08/29/2018   Decreased estrogen level 08/29/2018   Urinary frequency 08/29/2018   Urethral caruncle 07/10/2012   PARESTHESIA 07/01/2008   Breast cancer in situ 12/10/2007   Essential hypertension 06/17/2007    Past Surgical History:  Procedure Laterality Date   CYSTOSCOPY WITH URETHRAL CARUNCLE  07/10/2012   Procedure: CYSTOSCOPY WITH URETHRAL CARUNCLE;  Surgeon: Claybon Jabs,  MD;  Location: [facility];  Service: Urology;  Laterality: N/A;    EXCISION OF CARUNCLE    LEFT TOTAL MASTECTOMY/ LEFT AXILLARY SENTINEL LYMPH NODE BX  03-01-2008   INVASIVE  DUCTAL CARCINOMA IN SITU   LEFT WRIST SURG  2008   REPAIR NERVE   MASTECTOMY Left 2009   TOE SURGERY Right 2017   Great toe   TOTAL KNEE ARTHROPLASTY Left 07/28/2020   Procedure: TOTAL KNEE ARTHROPLASTY;  Surgeon: Netta Cedars, MD;  Location: [facility];  Service: Orthopedics;  Laterality: Left;   VAGINAL HYSTERECTOMY  1998   W/ BILATERAL SALPINGOOPHECTOMY    OB History   No obstetric history on file.      Home Medications    Prior to Admission medications   Medication Sig Start Date End Date Taking? Authorizing Provider  benzonatate (TESSALON) 100 MG capsule Take 1 capsule (100 mg total) by mouth every 8 (eight) hours as needed for cough. 01/03/23  Yes Lashea Goda, Hildred Alamin E, FNP  fluticasone (FLONASE) 50 MCG/ACT nasal spray Place 1 spray into both nostrils daily. 01/03/23  Yes Edwardo Wojnarowski, Michele Rockers, FNP  albuterol (VENTOLIN HFA) 108 (90 Base) MCG/ACT inhaler Inhale 1-2 puffs into the lungs every 6 (six) hours as needed for wheezing or shortness of breath. 12/07/21   Hazel Sams, PA-C  amLODipine (NORVASC) 2.5 MG tablet Take 1 tablet (2.5 mg total) by mouth daily. 11/26/22 11/26/23  Minette Brine, FNP  Artificial Tear  Ointment (DRY EYES OP) Place 1 drop into both eyes daily as needed (Dry eyes). Thera tears    [provider]  calcium carbonate (OS-CAL - DOSED IN MG OF ELEMENTAL CALCIUM) 1250 (500 Ca) MG tablet Take 1 tablet by mouth daily with breakfast.     [provider]  cholecalciferol (VITAMIN D) 1000 units tablet Take 1,000 Units by mouth 2 (two) times daily.    [provider]  glucose blood (ONETOUCH VERIO) test strip Check blood sugar daily 11/06/22   Minette Brine, FNP  hydrochlorothiazide (HYDRODIURIL) 12.5 MG tablet Take 1 tablet by mouth once daily 11/28/22   Minette Brine, FNP  Lancets [facility] DELICA PLUS [id number]) MISC USE TO TEST BLOOD SUGAR DAILY 07/31/22   Minette Brine, FNP  Polyethyl Glycol-Propyl Glycol (SYSTANE) 0.4-0.3 % SOLN Place 1 drop into both eyes at bedtime.    [provider]  traMADol (ULTRAM) 50 MG tablet Take 1 tablet (50 mg total) by mouth every 6 (six) hours as needed (pain). Patient not taking: Reported on 11/26/2022 10/13/22   Barrett Henle, MD  UNABLE TO FIND Med Name: vitamins for eyes    [provider]  valACYclovir (VALTREX) 1000 MG tablet Take 1,000 mg by mouth daily as needed (Cold sore).    [provider]  vitamin B-12 (CYANOCOBALAMIN) 500 MCG tablet Take 500 mcg by mouth daily.    [provider]  vitamin C (ASCORBIC ACID) 500 MG tablet Take 500 mg by mouth daily.    [provider]    Family History Family History  Problem Relation Age of Onset   Diabetes Mother    Diabetes Father    Kidney disease Father    Diabetes Brother     Social History Social History   Tobacco Use   Smoking status: Former    Packs/day: 2.00    Years: 20.00    Total pack years: 40.00    Types: Cigarettes    Quit date: 01/19/1987    Years since quitting: 35.9   Smokeless tobacco: Never  Vaping Use   Vaping Use: Never used  Substance Use Topics   Alcohol use: No   Drug use: Never     Allergies   Patient has no known allergies.   Review of Systems Review of Systems Per HPI  Physical Exam Triage Vital Signs ED Triage Vitals  Enc Vitals Group     BP 01/03/23 1238 131/81     Pulse Rate 01/03/23 1238 74     Resp 01/03/23 1238 16     Temp 01/03/23 1238 98.1 F (36.7 C)     Temp Source 01/03/23 1238 Oral     SpO2 01/03/23 1238 98 %     Weight --      Height --      Head Circumference --      Peak Flow --      Pain Score 01/03/23 1236 0     Pain Loc --      Pain Edu? --      Excl. in [place]? --    No data found.  Updated Vital Signs BP 131/81 (BP Location: Left  Arm)   Pulse 74   Temp 98.1 F (36.7 C) (Oral)   Resp 16   SpO2 98%   Visual Acuity Right Eye Distance:   Left Eye Distance:   Bilateral Distance:    Right Eye Near:   Left Eye Near:    Bilateral Near:  Physical Exam Constitutional:      General: She is not in acute distress.    Appearance: Normal appearance. She is not toxic-appearing or diaphoretic.  HENT:     Head: Normocephalic and atraumatic.     Right Ear: Tympanic membrane and ear canal normal.     Left Ear: Tympanic membrane and ear canal normal.     Nose: Congestion present.     Mouth/Throat:     Mouth: Mucous membranes are moist.     Pharynx: No posterior oropharyngeal erythema.  Eyes:     Extraocular Movements: Extraocular movements intact.     Conjunctiva/sclera: Conjunctivae normal.     Pupils: Pupils are equal, round, and reactive to light.  Cardiovascular:     Rate and Rhythm: Normal rate and regular rhythm.     Pulses: Normal pulses.     Heart sounds: Normal heart sounds.  Pulmonary:     Effort: Pulmonary effort is normal. No respiratory distress.     Breath sounds: Normal breath sounds. No stridor. No wheezing, rhonchi or rales.  Abdominal:     General: Abdomen is flat. Bowel sounds are normal.     Palpations: Abdomen is soft.  Musculoskeletal:        General: Normal range of motion.     Cervical back: Normal range of motion.  Skin:    General: Skin is warm and dry.  Neurological:     General: No focal deficit present.     Mental Status: She is alert and oriented to person, place, and time. Mental status is at baseline.  Psychiatric:        Mood and Affect: Mood normal.        Behavior: Behavior normal.      UC Treatments / Results  Labs (all labs ordered are listed, but only abnormal results are displayed) Labs Reviewed  SARS CORONAVIRUS 2 (TAT 6-24 HRS)    EKG   Radiology DG Chest 2 View  Result Date: 01/03/2023 CLINICAL DATA:  Cough for 3 days.  Worse at night. EXAM: CHEST -  2 VIEW COMPARISON:  Chest two views 02/26/2008 FINDINGS: Cardiac silhouette and mediastinal contours are within normal limits. Mild calcification within aortic arch. The lungs are clear. Mild-to-moderate multilevel degenerative disc changes of the mid to upper thoracic spine. IMPRESSION: No active cardiopulmonary disease. Electronically Signed   By: Yvonne Kendall M.D.   On: 01/03/2023 13:00    Procedures Procedures (including critical care time)  Medications Ordered in UC Medications - No data to display  Initial Impression / Assessment and Plan / UC Course  I have reviewed the triage vital signs and the nursing notes.  Pertinent labs & imaging results that were available during my care of the patient were reviewed by me and considered in my medical decision making (see chart for details).     Patient presents with symptoms likely from a viral upper respiratory infection.   Patient is nontoxic appearing and not in need of emergent medical intervention.  COVID test pending.  Chest x-ray was negative for any acute cardiopulmonary process.  Discussed incidental finding of calcification of the aorta with patient and following with PCP for this.  She voiced understanding.  Suspect blood-tinged sputum is due to inflammation in chest or harsh cough.  Do not think any emergent evaluation is necessary for this given that frank hemoptysis is not present and vital signs are stable.  Recommended symptom control with medications and supportive care.  Patient sent prescriptions.  Return if symptoms fail to improve. Patient states understanding and is agreeable.  Discharged with PCP followup.  Final Clinical Impressions(s) / UC Diagnoses   Final diagnoses:  Viral upper respiratory tract infection with cough     Discharge Instructions      Chest x-ray was normal.  Suspect viral cause to your symptoms.  I have prescribed 2 medications to help alleviate symptoms.  COVID test is pending.  We will call  if it is positive.  Please follow-up if any symptoms persist or worsen.     ED Prescriptions     Medication Sig Dispense Auth. Provider   benzonatate (TESSALON) 100 MG capsule Take 1 capsule (100 mg total) by mouth every 8 (eight) hours as needed for cough. 21 capsule [place], [place] E, [place]   fluticasone [facility]) 50 MCG/ACT nasal spray Place 1 spray into both nostrils daily. 16 g Teodora Medici, [place]      PDMP not reviewed this encounter.   Teodora Medici, [place] 01/03/23 1325

## 2023-01-03 NOTE — Discharge Instructions (Signed)
Chest x-ray was normal.  Suspect viral cause to your symptoms.  I have prescribed 2 medications to help alleviate symptoms.  COVID test is pending.  We will call if it is positive.  Please follow-up if any symptoms persist or worsen.

## 2023-01-04 LAB — SARS CORONAVIRUS 2 (TAT 6-24 HRS): SARS Coronavirus 2: NEGATIVE

## 2023-01-14 ENCOUNTER — Ambulatory Visit (INDEPENDENT_AMBULATORY_CARE_PROVIDER_SITE_OTHER): Payer: Medicare HMO | Admitting: Nurse Practitioner

## 2023-01-14 ENCOUNTER — Encounter: Payer: Self-pay | Admitting: Nurse Practitioner

## 2023-01-14 VITALS — BP 128/72 | HR 65 | Temp 97.6°F | Ht 63.0 in | Wt 148.0 lb

## 2023-01-14 DIAGNOSIS — E1142 Type 2 diabetes mellitus with diabetic polyneuropathy: Secondary | ICD-10-CM | POA: Insufficient documentation

## 2023-01-14 DIAGNOSIS — I10 Essential (primary) hypertension: Secondary | ICD-10-CM

## 2023-01-14 NOTE — Progress Notes (Signed)
I,Alida Greiner,acting as a Education administrator for Minette Brine, FNP.,have documented all relevant documentation on the behalf of Minette Brine, FNP,as directed by  Minette Brine, FNP while in the presence of Minette Brine, [place].    Subjective:     Patient ID: Yvonne Barron , female    DOB: January 10, 1946 , 77 y.o.   MRN: [id number]   Chief Complaint  Patient presents with   Hypertension    Takes at home daily, some low readings    HPI  Patient presents today for Htn follow up. Blood pressure range has been 104/56 - 138/80, she only had a    Hypertension This is a chronic problem. The current episode started more than 1 year ago. The problem has been gradually improving since onset. The problem is controlled. Pertinent negatives include no anxiety, chest pain or palpitations. Risk factors for coronary artery disease include diabetes mellitus. Past treatments include calcium channel blockers and diuretics.     Past Medical History:  Diagnosis Date   Arthritis    knee Lt   History of breast cancer 2009--  INVASIVE DUCTAL IN SITU--  S/P MASTECTOMY   NO RECURRENCE   Hypertension    Pre-diabetes    Urethral caruncle      Family History  Problem Relation Age of Onset   Diabetes Mother    Diabetes Father    Kidney disease Father    Diabetes Brother      Current Outpatient Medications:    albuterol (VENTOLIN HFA) 108 (90 Base) MCG/ACT inhaler, Inhale 1-2 puffs into the lungs every 6 (six) hours as needed for wheezing or shortness of breath., Disp: 1 each, Rfl: 0   amLODipine (NORVASC) 2.5 MG tablet, Take 1 tablet (2.5 mg total) by mouth daily., Disp: 30 tablet, Rfl: 2   Artificial Tear Ointment (DRY EYES OP), Place 1 drop into both eyes daily as needed (Dry eyes). Thera tears, Disp: , Rfl:    benzonatate (TESSALON) 100 MG capsule, Take 1 capsule (100 mg total) by mouth every 8 (eight) hours as needed for cough., Disp: 21 capsule, Rfl: 0   calcium carbonate (OS-CAL - DOSED IN MG OF  ELEMENTAL CALCIUM) 1250 (500 Ca) MG tablet, Take 1 tablet by mouth daily with breakfast. , Disp: , Rfl:    cholecalciferol (VITAMIN D) 1000 units tablet, Take 1,000 Units by mouth 2 (two) times daily., Disp: , Rfl:    fluticasone (FLONASE) 50 MCG/ACT nasal spray, Place 1 spray into both nostrils daily., Disp: 16 g, Rfl: 0   glucose blood (ONETOUCH VERIO) test strip, Check blood sugar daily, Disp: 100 each, Rfl: 3   hydrochlorothiazide (HYDRODIURIL) 12.5 MG tablet, Take 1 tablet by mouth once daily, Disp: 90 tablet, Rfl: 0   Lancets (ONETOUCH DELICA PLUS [id number]) MISC, USE TO TEST BLOOD SUGAR DAILY, Disp: 100 each, Rfl: 3   Polyethyl Glycol-Propyl Glycol (SYSTANE) 0.4-0.3 % SOLN, Place 1 drop into both eyes at bedtime., Disp: , Rfl:    UNABLE TO FIND, Med Name: vitamins for eyes, Disp: , Rfl:    valACYclovir (VALTREX) 1000 MG tablet, Take 1,000 mg by mouth daily as needed (Cold sore)., Disp: , Rfl:    vitamin B-12 (CYANOCOBALAMIN) 500 MCG tablet, Take 500 mcg by mouth daily., Disp: , Rfl:    vitamin C (ASCORBIC ACID) 500 MG tablet, Take 500 mg by mouth daily., Disp: , Rfl:    No Known Allergies   Review of Systems  Constitutional: Negative.  Negative for fatigue.  HENT: Negative.  Eyes: Negative.   Respiratory: Negative.    Cardiovascular: Negative.  Negative for chest pain and palpitations.  Gastrointestinal: Negative.   Endocrine: Negative for polydipsia, polyphagia and polyuria.     Today's Vitals   01/14/23 1423  BP: 128/72  Pulse: 65  Temp: 97.6 F (36.4 C)  TempSrc: Oral  SpO2: 97%  Weight: 148 lb (67.1 kg)  Height: 5' 3"$  (1.6 m)   Body mass index is 26.22 kg/m.   Objective:  Physical Exam Vitals reviewed.  Constitutional:      General: She is not in acute distress.    Appearance: Normal appearance.  Cardiovascular:     Rate and Rhythm: Normal rate and regular rhythm.     Pulses: Normal pulses.     Heart sounds: Normal heart sounds. No murmur heard. Pulmonary:      Effort: Pulmonary effort is normal. No respiratory distress.     Breath sounds: Normal breath sounds. No wheezing.  Skin:    General: Skin is warm and dry.     Capillary Refill: Capillary refill takes less than 2 seconds.  Neurological:     General: No focal deficit present.     Mental Status: She is alert and oriented to person, place, and time.     Cranial Nerves: No cranial nerve deficit.     Motor: No weakness.         Assessment And Plan:     1. Essential hypertension Comments: Blood pressure is well controlled, continue current medications will consider stopping a blood pressure medication at next visit - Microalbumin / Creatinine Urine Ratio  2. Diabetic polyneuropathy associated with type 2 diabetes mellitus ([place]) Comments: Controlled with prediabetes range, continue with regular exercise and focusing on healthy diet low in sugar and carbs - Microalbumin / Creatinine Urine Ratio     Patient was given opportunity to ask questions. Patient verbalized understanding of the plan and was able to repeat key elements of the plan. All questions were answered to their satisfaction.  Minette Brine, FNP   I, Minette Brine, FNP, have reviewed all documentation for this visit. The documentation on 01/14/23 for the exam, diagnosis, procedures, and orders are all accurate and complete.   IF YOU HAVE BEEN REFERRED TO A SPECIALIST, IT MAY TAKE 1-2 WEEKS TO SCHEDULE/PROCESS THE REFERRAL. IF YOU HAVE NOT HEARD FROM US/SPECIALIST IN TWO WEEKS, PLEASE GIVE [place] A CALL AT [phone].   THE PATIENT IS ENCOURAGED TO PRACTICE SOCIAL DISTANCING DUE TO THE COVID-19 PANDEMIC.

## 2023-01-16 LAB — MICROALBUMIN / CREATININE URINE RATIO
Creatinine, Urine: 52.5 mg/dL
Microalb/Creat Ratio: <6 mg/g creat (ref 0–29)
Microalbumin, Urine: <3 ug/mL

## 2023-01-21 NOTE — Progress Notes (Signed)
NEUROLOGY FOLLOW UP OFFICE NOTE  Yvonne Barron [id number]  Subjective:  Yvonne Barron is a 77 y.o. year old  right-handed female with a medical history of pre-diabetes, OA s/p left knee replacement, vit D deficiency, breast cancer (dx about 13 years ago, had surgery, no chemo or radiation, but tomoxifen), and HTN who we last saw on 07/24/22.  To briefly review: Patient first noticed numbness and tingling in her toes ~2021. At first it just felt like her feet were dry. Lotion did not help. At night her toes would tingle. It does not affect her as much when she is up and walking. Sometimes she feels twitching in her legs that she can move her legs around and it will help. Lately, she feels an occasional crawling sensation on her legs. She can feel tightness under her toes and like she is "walking on bubbles." She denies weakness of her legs. She walks often and does not use assistive devices. She denies falls.   She denies significant neck or back pain. She did have some pain in her back for which she went to ED (09/2021). She was diagnosed with left low back pain and sciatica. She was prescribed steroids and muscle relaxers which she only took one or two. She takes tumeric and bromelain, which helps. This pain does not radiate to her feet though.She denies bowel or bladder symptoms.    Her PCP recommended podiatry referral. Patient sees Dr Cannon Kettle in podiatry. Gabapentin was offered, but patient declined until her neurology appointment.   Of note, patient mentions that as a child, she would walk barefoot and her toes would pop a lot.   EtOH use: Occasional wine with dinner  Restrictive diet? No Family history of neuropathy/myopathy/NM disease? No     Prior medications that have been tried: [employer] supplement (did not help); also previously took B12, but has stopped. She has also tried nerve stimulation that helped, but she could not afford to continue that.  Most recent  Assessment and Plan (07/24/22): Overall, symptoms are most consistent with a mild distal, symmetric, generalized polyneuropathy. The most likely etiology is prediabetes, which I discussed with the patient. I will evaluate for other treatable causes as below. We discussed that an EMG could be pursued, but would likely not alter management currently. We discussed over the counter measures vs prescription medication (such as gabapentin). Patient would prefer to start with over the counter medication for now. She also has occasional sciatica like symptoms, but this is currently stable and not bothersome.   PLAN: -Blood work (tier 1 neuropathy labs): B12, SPEP, IFE -Lidocaine cream, over the counter, applied to feet as needed for tingling -Discussed importance of healthy diet and continuing her regular exercise program  Since their last visit: B12, SPEP, and IFE were unremarkable. Patient began taking alpha lipoic acid, which she continues to take. She still has numbness and tingling in her feet, it may be a little less annoying than previous. She feels like her feet are knotting up or swollen at night. Lidocaine cream did not help much.  PCP ordered MRI lumbar spine due to sciatica symptoms mentioned at 11/26/22 visit. Insurance denied this. Patient describes a muscle tightness that grabs her. She has to shake her hip area to help it feel better. It does not radiate further into her leg. It used to happen on the right, but now is only occurring on the left. It is a very severe pain. She has not had this pain  in at least a month though. She sees a Restaurant manager, fast food who is helping her with this.   MEDICATIONS:  Outpatient Encounter Medications as of 01/24/2023  Medication Sig   albuterol (VENTOLIN HFA) 108 (90 Base) MCG/ACT inhaler Inhale 1-2 puffs into the lungs every 6 (six) hours as needed for wheezing or shortness of breath.   amLODipine (NORVASC) 2.5 MG tablet Take 1 tablet (2.5 mg total) by mouth daily.    Artificial Tear Ointment (DRY EYES OP) Place 1 drop into both eyes daily as needed (Dry eyes). Thera tears   benzonatate (TESSALON) 100 MG capsule Take 1 capsule (100 mg total) by mouth every 8 (eight) hours as needed for cough.   calcium carbonate (OS-CAL - DOSED IN MG OF ELEMENTAL CALCIUM) 1250 (500 Ca) MG tablet Take 1 tablet by mouth daily with breakfast.    cholecalciferol (VITAMIN D) 1000 units tablet Take 1,000 Units by mouth 2 (two) times daily.   fluticasone (FLONASE) 50 MCG/ACT nasal spray Place 1 spray into both nostrils daily.   glucose blood (ONETOUCH VERIO) test strip Check blood sugar daily   hydrochlorothiazide (HYDRODIURIL) 12.5 MG tablet Take 1 tablet by mouth once daily   Lancets (ONETOUCH DELICA PLUS 123XX123) MISC USE TO TEST BLOOD SUGAR DAILY   Polyethyl Glycol-Propyl Glycol (SYSTANE) 0.4-0.3 % SOLN Place 1 drop into both eyes at bedtime.   UNABLE TO FIND Med Name: vitamins for eyes   valACYclovir (VALTREX) 1000 MG tablet Take 1,000 mg by mouth daily as needed (Cold sore).   vitamin B-12 (CYANOCOBALAMIN) 500 MCG tablet Take 500 mcg by mouth daily.   vitamin C (ASCORBIC ACID) 500 MG tablet Take 500 mg by mouth daily.   No facility-administered encounter medications on file as of 01/24/2023.    PAST MEDICAL HISTORY: Past Medical History:  Diagnosis Date   Arthritis    knee Lt   History of breast cancer 2009--  INVASIVE DUCTAL IN SITU--  S/P MASTECTOMY   NO RECURRENCE   Hypertension    Pre-diabetes    Urethral caruncle     PAST SURGICAL HISTORY: Past Surgical History:  Procedure Laterality Date   CYSTOSCOPY WITH URETHRAL CARUNCLE  07/10/2012   Procedure: CYSTOSCOPY WITH URETHRAL CARUNCLE;  Surgeon: Claybon Jabs, MD;  Location: [facility];  Service: Urology;  Laterality: N/A;    EXCISION OF CARUNCLE    LEFT TOTAL MASTECTOMY/ LEFT AXILLARY SENTINEL LYMPH NODE BX  03-01-2008   INVASIVE  DUCTAL CARCINOMA IN SITU   LEFT WRIST SURG  2008    REPAIR NERVE   MASTECTOMY Left 2009   TOE SURGERY Right 2017   Great toe   TOTAL KNEE ARTHROPLASTY Left 07/28/2020   Procedure: TOTAL KNEE ARTHROPLASTY;  Surgeon: Netta Cedars, MD;  Location: [facility];  Service: Orthopedics;  Laterality: Left;   VAGINAL HYSTERECTOMY  1998   W/ BILATERAL SALPINGOOPHECTOMY    ALLERGIES: No Known Allergies  FAMILY HISTORY: Family History  Problem Relation Age of Onset   Diabetes Mother    Diabetes Father    Kidney disease Father    Diabetes Brother     SOCIAL HISTORY: Social History   Tobacco Use   Smoking status: Former    Packs/day: 2.00    Years: 20.00    Total pack years: 40.00    Types: Cigarettes    Quit date: 01/19/1987    Years since quitting: 36.0   Smokeless tobacco: Never  Vaping Use   Vaping Use: Never used  Substance Use Topics  Alcohol use: No   Drug use: Never   Social History   Social History Narrative   Right handed   Lives with son one story home    Caffeine none   Retired but works part time      Objective:  Vital Signs:  BP 122/67   Pulse 73   Ht 5' 3"$  (1.6 m)   Wt 147 lb (66.7 kg)   SpO2 97%   BMI 26.04 kg/m   General: No acute distress.  Patient appears well-groomed.   Head:  Normocephalic/atraumatic Neck: supple, no paraspinal tenderness, full range of motion  Neurological Exam: Mental status: alert and oriented, speech fluent and not dysarthric, language intact.  Cranial nerves: CN I: not tested CN II: pupils equal, round and reactive to light, visual fields intact CN III, IV, VI:  full range of motion, no nystagmus, no ptosis CN V: facial sensation intact. CN VII: upper and lower face symmetric CN VIII: hearing intact CN IX, X: gag intact, uvula midline CN XI: sternocleidomastoid and trapezius muscles intact CN XII: tongue midline  Bulk & Tone: normal, no fasciculations. Motor:  muscle strength 5/5 throughout Deep Tendon Reflexes:  2+ except: 0 at left patella (knee replacement) and 0  at bilateral ankles.   Sensation:  Pinprick intact in all extremities. Vibration: 7 seconds in bilateral great toes. Proprioception intact in bilateral great toes. Finger to nose testing:  Without dysmetria.  Gait:  Normal station and stride.  Romberg negative.   Labs and Imaging review: New results: 07/2022: B12: 712 IFE and SPEP: no M protein HbA1c: 6.1  11/26/22: HbA1c: 6.2  Lumbar spine xray (10/13/22): FINDINGS: Five non rib-bearing lumbar type vertebra are identified in normal alignment.   There is no evidence of acute fracture or subluxation.   Mild degenerative disc disease at L4-5 and L5-S1 noted.   No other significant abnormalities noted.   IMPRESSION: 1. No acute abnormality. 2. Mild degenerative disc disease at L4-5 and L5-S1.  Assessment/Plan:  This is Lucious Groves, a 77 y.o. female with distal symmetric polyneuropathy, mild clinically. Her only known risk factor is pre-diabetes, which we discussed today. She has some back pain, but symptoms sound more MSK in nature. There is no evidence of radiculopathy on exam.    Plan: -Discussed prescribing low dose gabapentin at night to help with tingling in feet. Patient will consider it and call if she wants it. -Discussed foot care  Return to clinic in 1 year  Total time spent reviewing records, interview, history/exam, documentation, and coordination of care on day of encounter:  30 min  Kai Levins, MD

## 2023-01-24 ENCOUNTER — Encounter: Payer: Self-pay | Admitting: Neurology

## 2023-01-24 ENCOUNTER — Ambulatory Visit: Payer: Medicare HMO | Admitting: Neurology

## 2023-01-24 VITALS — BP 122/67 | HR 73 | Ht 63.0 in | Wt 147.0 lb

## 2023-01-24 DIAGNOSIS — R209 Unspecified disturbances of skin sensation: Secondary | ICD-10-CM

## 2023-01-24 DIAGNOSIS — M545 Low back pain, unspecified: Secondary | ICD-10-CM

## 2023-01-24 DIAGNOSIS — E1142 Type 2 diabetes mellitus with diabetic polyneuropathy: Secondary | ICD-10-CM

## 2023-01-24 DIAGNOSIS — G8929 Other chronic pain: Secondary | ICD-10-CM | POA: Diagnosis not present

## 2023-01-24 DIAGNOSIS — R7303 Prediabetes: Secondary | ICD-10-CM

## 2023-01-24 NOTE — Patient Instructions (Addendum)
We discussed a medication for tingling in feet, called gabapentin. If you would like to try this medication, call our office.  I would like to see you again in 1 year.  The physicians and staff at [facility] are committed to providing excellent care. You may receive a survey requesting feedback about your experience at our office. We strive to receive "very good" responses to the survey questions. If you feel that your experience would prevent you from giving the office a "very good " response, please contact our office to try to remedy the situation. We may be reached at [phone]. Thank you for taking the time out of your busy day to complete the survey.  Kai Levins, MD [facility]

## 2023-02-19 ENCOUNTER — Other Ambulatory Visit: Payer: Self-pay | Admitting: Nurse Practitioner

## 2023-03-11 ENCOUNTER — Other Ambulatory Visit: Payer: Self-pay | Admitting: Nurse Practitioner

## 2023-03-11 DIAGNOSIS — I10 Essential (primary) hypertension: Secondary | ICD-10-CM

## 2023-04-02 ENCOUNTER — Other Ambulatory Visit: Payer: Self-pay | Admitting: Nurse Practitioner

## 2023-04-24 ENCOUNTER — Ambulatory Visit (INDEPENDENT_AMBULATORY_CARE_PROVIDER_SITE_OTHER): Payer: Medicare HMO

## 2023-04-24 ENCOUNTER — Ambulatory Visit: Payer: Medicare HMO | Admitting: Nurse Practitioner

## 2023-04-24 VITALS — BP 116/62 | HR 75 | Temp 98.4°F | Ht 62.8 in | Wt 149.2 lb

## 2023-04-24 DIAGNOSIS — Z0000 Encounter for general adult medical examination without abnormal findings: Secondary | ICD-10-CM

## 2023-04-24 NOTE — Progress Notes (Signed)
Subjective:   Yvonne Barron is a 77 y.o. female who presents for Medicare Annual (Subsequent) preventive examination.  Review of Systems     Cardiac Risk Factors include: advanced age (>27men, >64 women);diabetes mellitus;hypertension     Objective:    Today's Vitals   04/24/23 1359  BP: 116/62  Pulse: 75  Temp: 98.4 F (36.9 C)  TempSrc: Oral  SpO2: 95%  Weight: 149 lb 3.2 oz (67.7 kg)  Height: 5' 2.8" (1.595 m)   Body mass index is 26.6 kg/m.     04/24/2023    2:08 PM 01/24/2023    1:56 PM 07/24/2022    2:32 PM 04/11/2022    2:37 PM 03/28/2021    8:34 AM 07/28/2020   11:29 AM 07/18/2020    1:31 PM  Advanced Directives  Does Patient Have a Medical Advance Directive? Yes Yes Yes Yes Yes Yes Yes  Type of Estate agent of [place];Living will Healthcare Power of [place];Living will Healthcare Power of [place];Living will Healthcare Power of [place];Living will Healthcare Power of [place];Living will Healthcare Power of [place];Living will Living will  Does patient want to make changes to medical advance directive?      No - Patient declined   Copy of Healthcare Power of Attorney in Chart? No - copy requested   No - copy requested No - copy requested No - copy requested     Current Medications (verified) Outpatient Encounter Medications as of 04/24/2023  Medication Sig   amLODipine (NORVASC) 2.5 MG tablet Take 1 tablet by mouth once daily   Artificial Tear Ointment (DRY EYES OP) Place 1 drop into both eyes daily as needed (Dry eyes). Thera tears   benzonatate (TESSALON) 100 MG capsule Take 1 capsule (100 mg total) by mouth every 8 (eight) hours as needed for cough.   calcium carbonate (OS-CAL - DOSED IN MG OF ELEMENTAL CALCIUM) 1250 (500 Ca) MG tablet Take 1 tablet by mouth daily with breakfast.    cholecalciferol (VITAMIN D) 1000 units tablet Take 1,000 Units by mouth 2 (two) times daily.   fluticasone (FLONASE) 50 MCG/ACT nasal spray Place  1 spray into both nostrils daily.   glucose blood (ONETOUCH VERIO) test strip Check blood sugar daily   hydrochlorothiazide (HYDRODIURIL) 12.5 MG tablet Take 1 tablet by mouth once daily   Lancets (ONETOUCH DELICA PLUS LANCET33G) MISC USE TO TEST BLOOD SUGAR DAILY   Polyethyl Glycol-Propyl Glycol (SYSTANE) 0.4-0.3 % SOLN Place 1 drop into both eyes at bedtime.   UNABLE TO FIND Med Name: vitamins for eyes   valACYclovir (VALTREX) 1000 MG tablet Take 1,000 mg by mouth daily as needed (Cold sore).   vitamin B-12 (CYANOCOBALAMIN) 500 MCG tablet Take 500 mcg by mouth daily.   vitamin C (ASCORBIC ACID) 500 MG tablet Take 500 mg by mouth daily.   albuterol (VENTOLIN HFA) 108 (90 Base) MCG/ACT inhaler Inhale 1-2 puffs into the lungs every 6 (six) hours as needed for wheezing or shortness of breath. (Patient not taking: Reported on 04/24/2023)   No facility-administered encounter medications on file as of 04/24/2023.    Allergies (verified) Patient has no known allergies.   History: Past Medical History:  Diagnosis Date   Arthritis    knee Lt   History of breast cancer 2009--  INVASIVE DUCTAL IN SITU--  S/P MASTECTOMY   NO RECURRENCE   Hypertension    Pre-diabetes    Urethral caruncle    Past Surgical History:  Procedure Laterality Date  CYSTOSCOPY WITH URETHRAL CARUNCLE  07/10/2012   Procedure: CYSTOSCOPY WITH URETHRAL CARUNCLE;  Surgeon: Garnett Farm, MD;  Location: [facility];  Service: Urology;  Laterality: N/A;    EXCISION OF CARUNCLE    LEFT TOTAL MASTECTOMY/ LEFT AXILLARY SENTINEL LYMPH NODE BX  03-01-2008   INVASIVE  DUCTAL CARCINOMA IN SITU   LEFT WRIST SURG  2008   REPAIR NERVE   MASTECTOMY Left 2009   TOE SURGERY Right 2017   Great toe   TOTAL KNEE ARTHROPLASTY Left 07/28/2020   Procedure: TOTAL KNEE ARTHROPLASTY;  Surgeon: Beverely Low, MD;  Location: [facility];  Service: Orthopedics;  Laterality: Left;   VAGINAL HYSTERECTOMY  1998   W/ BILATERAL  SALPINGOOPHECTOMY   Family History  Problem Relation Age of Onset   Diabetes Mother    Diabetes Father    Kidney disease Father    Diabetes Brother    Social History   Socioeconomic History   Marital status: Divorced    Spouse name: Not on file   Number of children: Not on file   Years of education: Not on file   Highest education level: Not on file  Occupational History   Occupation: semi- retired  Tobacco Use   Smoking status: Former    Packs/day: 2.00    Years: 20.00    Additional pack years: 0.00    Total pack years: 40.00    Types: Cigarettes    Quit date: 01/19/1987    Years since quitting: 36.2   Smokeless tobacco: Never  Vaping Use   Vaping Use: Never used  Substance and Sexual Activity   Alcohol use: No   Drug use: Never   Sexual activity: Not on file  Other Topics Concern   Not on file  Social History Narrative   Right handed   Lives with son one story home    Caffeine none   Retired but works part time   International aid/development worker of Corporate investment banker Strain: Low Risk  (04/24/2023)   Overall Financial Resource Strain (CARDIA)    Difficulty of Paying Living Expenses: Not hard at all  Food Insecurity: No Food Insecurity (04/24/2023)   Hunger Vital Sign    Worried About Running Out of Food in the Last Year: Never true    Ran Out of Food in the Last Year: Never true  Transportation Needs: No Transportation Needs (04/24/2023)   PRAPARE - Administrator, Civil Service (Medical): No    Lack of Transportation (Non-Medical): No  Physical Activity: Insufficiently Active (04/24/2023)   Exercise Vital Sign    Days of Exercise per Week: 2 days    Minutes of Exercise per Session: 50 min  Stress: No Stress Concern Present (04/24/2023)   [employer] of Occupational Health - Occupational Stress Questionnaire    Feeling of Stress : Not at all  Social Connections: Not on file    Tobacco Counseling Counseling given: Not Answered   Clinical  Intake:  Pre-visit preparation completed: Yes  Pain : No/denies pain     Nutritional Status: BMI 25 -29 Overweight Nutritional Risks: None Diabetes: Yes  How often do you need to have someone help you when you read instructions, pamphlets, or other written materials from your doctor or pharmacy?: 1 - Never  Diabetic? Yes Nutrition Risk Assessment:  Has the patient had any N/V/D within the last 2 months?  No  Does the patient have any non-healing wounds?  No  Has the patient  had any unintentional weight loss or weight gain?  No   Diabetes:  Is the patient diabetic?  Yes  If diabetic, was a CBG obtained today?  No  Did the patient bring in their glucometer from home?  No  How often do you monitor your CBG's? daily.   Financial Strains and Diabetes Management:  Are you having any financial strains with the device, your supplies or your medication? No .  Does the patient want to be seen by Chronic Care Management for management of their diabetes?  No  Would the patient like to be referred to a Nutritionist or for Diabetic Management?  No   Diabetic Exams:  Diabetic Eye Exam: Completed 07/10/2022 Diabetic Foot Exam: Completed 11/26/2022   Interpreter Needed?: No  Information entered by :: NAllen LPN   Activities of Daily Living    04/24/2023    2:09 PM  In your present state of health, do you have any difficulty performing the following activities:  Hearing? 0  Vision? 0  Difficulty concentrating or making decisions? 0  Walking or climbing stairs? 0  Dressing or bathing? 0  Doing errands, shopping? 0  Preparing Food and eating ? N  Using the Toilet? N  In the past six months, have you accidently leaked urine? N  Do you have problems with loss of bowel control? N  Managing your Medications? N  Managing your Finances? N  Housekeeping or managing your Housekeeping? N    Patient Care Team: [name], FNP as PCP - General (General Practice)  Indicate any  recent Medical Services you may have received from other than [facility] providers in the past year (date may be approximate).     Assessment:   This is a routine wellness examination for Yvonne Barron.  Hearing/Vision screen Vision Screening - Comments:: Regular eye exams, [facility]  Dietary issues and exercise activities discussed: Current Exercise Habits: Home exercise routine, Type of exercise: calisthenics;strength training/weights (personal trainer), Time (Minutes): 45, Frequency (Times/Week): 2, Weekly Exercise (Minutes/Week): 90   Goals Addressed             This Visit's Progress    Patient Stated       04/24/2023, wants to get off BP meds and watch diet       Depression Screen    04/24/2023    2:09 PM 01/14/2023    2:22 PM 11/26/2022   12:22 PM 07/31/2022    3:31 PM 04/11/2022    2:38 PM 03/28/2021    8:34 AM 03/21/2021   10:26 AM  PHQ 2/9 Scores  PHQ - 2 Score 0 0 0 0 0 0 0    Fall Risk    04/24/2023    2:09 PM 01/24/2023    1:56 PM 01/14/2023    2:22 PM 11/26/2022   12:22 PM 07/31/2022    3:30 PM  Fall Risk   Falls in the past year? 0 0 0 0 0  Number falls in past yr: 0 0  0 0  Injury with Fall? 0 0  0 0  Risk for fall due to : Medication side effect   No Fall Risks History of fall(s);No Fall Risks  Follow up Falls prevention discussed;Education provided;Falls evaluation completed Falls evaluation completed  Falls evaluation completed Falls evaluation completed    FALL RISK PREVENTION PERTAINING TO THE HOME:  Any stairs in or around the home? Yes  If so, are there any without handrails? No  Home free of loose throw rugs  in walkways, pet beds, electrical cords, etc? Yes  Adequate lighting in your home to reduce risk of falls? Yes   ASSISTIVE DEVICES UTILIZED TO PREVENT FALLS:  Life alert? No  Use of a cane, walker or w/c? No  Grab bars in the bathroom? Yes  Shower chair or bench in shower? No  Elevated toilet seat or a handicapped toilet? No   TIMED UP AND  GO:  Was the test performed? Yes .  Length of time to ambulate 10 feet: 5 sec.   Gait steady and fast without use of assistive device  Cognitive Function:        04/24/2023    2:13 PM 04/11/2022    2:40 PM 03/28/2021    8:35 AM 03/16/2020    9:52 AM 03/11/2019    2:30 PM  6CIT Screen  What Year? 0 points 0 points 0 points 0 points 0 points  What month? 0 points 0 points 0 points 0 points 0 points  What time? 0 points 0 points 0 points 0 points 0 points  Count back from 20 0 points 0 points 2 points 0 points 0 points  Months in reverse 0 points 2 points 2 points 0 points 0 points  Repeat phrase 2 points 0 points 0 points 0 points 0 points  Total Score 2 points 2 points 4 points 0 points 0 points    Immunizations Immunization History  Administered Date(s) Administered   Fluad Quad(high Dose 65+) 09/19/2020, 09/13/2021, 11/26/2022   Influenza,inj,quad, With Preservative 09/09/2019   Influenza-Unspecified 09/23/2018   PFIZER(Purple Top)SARS-COV-2 Vaccination 01/22/2020, 02/16/2020, 10/26/2020   PNEUMOCOCCAL CONJUGATE-20 12/20/2021   Tdap 09/27/2012   Zoster Recombinat (Shingrix) 04/17/2022, 07/20/2022    TDAP status: Due, Education has been provided regarding the importance of this vaccine. Advised may receive this vaccine at local pharmacy or Health Dept. Aware to provide a copy of the vaccination record if obtained from local pharmacy or Health Dept. Verbalized acceptance and understanding.  Flu Vaccine status: Up to date  Pneumococcal vaccine status: Up to date  Covid-19 vaccine status: Completed vaccines  Qualifies for Shingles Vaccine? Yes   Zostavax completed Yes   Shingrix Completed?: Yes  Screening Tests Health Maintenance  Topic Date Due   COVID-19 Vaccine (4 - 2023-24 season) 08/09/2022   DTaP/Tdap/Td (2 - Td or Tdap) 09/27/2022   HEMOGLOBIN A1C  05/28/2023   INFLUENZA VACCINE  07/10/2023   OPHTHALMOLOGY EXAM  07/11/2023   Diabetic kidney evaluation - eGFR  measurement  11/27/2023   FOOT EXAM  11/27/2023   Diabetic kidney evaluation - Urine ACR  01/15/2024   Medicare Annual Wellness (AWV)  04/23/2024   COLONOSCOPY (Pts 45-72yrs Insurance coverage will need to be confirmed)  01/19/2029   Pneumonia Vaccine 60+ Years old  Completed   DEXA SCAN  Completed   Hepatitis C Screening  Completed   Zoster Vaccines- Shingrix  Completed   HPV VACCINES  Aged Out    Health Maintenance  Health Maintenance Due  Topic Date Due   COVID-19 Vaccine (4 - 2023-24 season) 08/09/2022   DTaP/Tdap/Td (2 - Td or Tdap) 09/27/2022    Colorectal cancer screening: Type of screening: Colonoscopy. Completed 01/19/2019. Repeat every 10 years  Mammogram status: Completed 06/29/2021. Repeat every 2 years  Bone Density status: Completed 12/12/2020.   Lung Cancer Screening: (Low Dose CT Chest recommended if Age 13-80 years, 30 pack-year currently smoking OR have quit w/in 15years.) does not qualify.   Lung Cancer Screening Referral: no  Additional Screening:  Hepatitis C Screening: does qualify; Completed 05/30/2014  Vision Screening: Recommended annual ophthalmology exams for early detection of glaucoma and other disorders of the eye. Is the patient up to date with their annual eye exam?  Yes  Who is the provider or what is the name of the office in which the patient attends annual eye exams? [facility] If pt is not established with a provider, would they like to be referred to a provider to establish care? No .   Dental Screening: Recommended annual dental exams for proper oral hygiene  Community Resource Referral / Chronic Care Management: CRR required this visit?  No   CCM required this visit?  No      Plan:     I have personally reviewed and noted the following in the patient's chart:   Medical and social history Use of alcohol, tobacco or illicit drugs  Current medications and supplements including opioid prescriptions. Patient is not currently  taking opioid prescriptions. Functional ability and status Nutritional status Physical activity Advanced directives List of other physicians Hospitalizations, surgeries, and ER visits in previous 12 months Vitals Screenings to include cognitive, depression, and falls Referrals and appointments  In addition, I have reviewed and discussed with patient certain preventive protocols, quality metrics, and best practice recommendations. A written personalized care plan for preventive services as well as general preventive health recommendations were provided to patient.     Barb Merino, LPN   [id number]   Nurse Notes: none

## 2023-04-24 NOTE — Patient Instructions (Addendum)
Yvonne Barron , Thank you for taking time to come for your Medicare Wellness Visit. I appreciate your ongoing commitment to your health goals. Please review the following plan we discussed and let me know if I can assist you in the future.   These are the goals we discussed:  Goals      HEMOGLOBIN A1C < 7.0     Healthy diet and lose 10 pounds     Patient Stated     03/16/2020, wants to eat cleaner and continue to exercise, maintain A1C     Patient Stated     03/28/2021, get knee back     Patient Stated     04/11/2022, continue maintain blood sugar and blood pressure and remain mobile and stay active     Patient Stated     04/24/2023, wants to get off BP meds and watch diet        This is a list of the screening recommended for you and due dates:  Health Maintenance  Topic Date Due   COVID-19 Vaccine (4 - 2023-24 season) 08/09/2022   DTaP/Tdap/Td vaccine (2 - Td or Tdap) 09/27/2022   Hemoglobin A1C  05/28/2023   Flu Shot  07/10/2023   Eye exam for diabetics  07/11/2023   Yearly kidney function blood test for diabetes  11/27/2023   Complete foot exam   11/27/2023   Yearly kidney health urinalysis for diabetes  01/15/2024   Medicare Annual Wellness Visit  04/23/2024   Colon Cancer Screening  01/19/2029   Pneumonia Vaccine  Completed   DEXA scan (bone density measurement)  Completed   Hepatitis C Screening: USPSTF Recommendation to screen - Ages 20-79 yo.  Completed   Zoster (Shingles) Vaccine  Completed   HPV Vaccine  Aged Out    Advanced directives: Please bring a copy of your POA (Power of Lam[place]) and/or Living Will to your next appointment.   Conditions/risks identified: none  Next appointment: Follow up in one year for your annual wellness visit    Preventive Care 65 Years and Older, Female Preventive care refers to lifestyle choices and visits with your health care provider that can promote health and wellness. What does preventive care include? A yearly physical  exam. This is also called an annual well check. Dental exams once or twice a year. Routine eye exams. Ask your health care provider how often you should have your eyes checked. Personal lifestyle choices, including: Daily care of your teeth and gums. Regular physical activity. Eating a healthy diet. Avoiding tobacco and drug use. Limiting alcohol use. Practicing safe sex. Taking low-dose aspirin every day. Taking vitamin and mineral supplements as recommended by your health care provider. What happens during an annual well check? The services and screenings done by your health care provider during your annual well check will depend on your age, overall health, lifestyle risk factors, and family history of disease. Counseling  Your health care provider may ask you questions about your: Alcohol use. Tobacco use. Drug use. Emotional well-being. Home and relationship well-being. Sexual activity. Eating habits. History of falls. Memory and ability to understand (cognition). Work and work Astronomer. Reproductive health. Screening  You may have the following tests or measurements: Height, weight, and BMI. Blood pressure. Lipid and cholesterol levels. These may be checked every 5 years, or more frequently if you are over 49 years old. Skin check. Lung cancer screening. You may have this screening every year starting at age 73 if you have a 30-pack-year history  of smoking and currently smoke or have quit within the past 15 years. Fecal occult blood test (FOBT) of the stool. You may have this test every year starting at age 86. Flexible sigmoidoscopy or colonoscopy. You may have a sigmoidoscopy every 5 years or a colonoscopy every 10 years starting at age 52. Hepatitis C blood test. Hepatitis B blood test. Sexually transmitted disease (STD) testing. Diabetes screening. This is done by checking your blood sugar (glucose) after you have not eaten for a while (fasting). You may have this  done every 1-3 years. Bone density scan. This is done to screen for osteoporosis. You may have this done starting at age 76. Mammogram. This may be done every 1-2 years. Talk to your health care provider about how often you should have regular mammograms. Talk with your health care provider about your test results, treatment options, and if necessary, the need for more tests. Vaccines  Your health care provider may recommend certain vaccines, such as: Influenza vaccine. This is recommended every year. Tetanus, diphtheria, and acellular pertussis (Tdap, Td) vaccine. You may need a Td booster every 10 years. Zoster vaccine. You may need this after age 66. Pneumococcal 13-valent conjugate (PCV13) vaccine. One dose is recommended after age 68. Pneumococcal polysaccharide (PPSV23) vaccine. One dose is recommended after age 24. Talk to your health care provider about which screenings and vaccines you need and how often you need them. This information is not intended to replace advice given to you by your health care provider. Make sure you discuss any questions you have with your health care provider. Document Released: 12/22/2015 Document Revised: 08/14/2016 Document Reviewed: 09/26/2015 Elsevier Interactive Patient Education  2017 [employer].  Fall Prevention in the Home Falls can cause injuries. They can happen to people of all ages. There are many things you can do to make your home safe and to help prevent falls. What can I do on the outside of my home? Regularly fix the edges of walkways and driveways and fix any cracks. Remove anything that might make you trip as you walk through a door, such as a raised step or threshold. Trim any bushes or trees on the path to your home. Use bright outdoor lighting. Clear any walking paths of anything that might make someone trip, such as rocks or tools. Regularly check to see if handrails are loose or broken. Make sure that both sides of any steps have  handrails. Any raised decks and porches should have guardrails on the edges. Have any leaves, snow, or ice cleared regularly. Use sand or salt on walking paths during winter. Clean up any spills in your garage right away. This includes oil or grease spills. What can I do in the bathroom? Use night lights. Install grab bars by the toilet and in the tub and shower. Do not use towel bars as grab bars. Use non-skid mats or decals in the tub or shower. If you need to sit down in the shower, use a plastic, non-slip stool. Keep the floor dry. Clean up any water that spills on the floor as soon as it happens. Remove soap buildup in the tub or shower regularly. Attach bath mats securely with double-sided non-slip rug tape. Do not have throw rugs and other things on the floor that can make you trip. What can I do in the bedroom? Use night lights. Make sure that you have a light by your bed that is easy to reach. Do not use any sheets or blankets  that are too big for your bed. They should not hang down onto the floor. Have a firm chair that has side arms. You can use this for support while you get dressed. Do not have throw rugs and other things on the floor that can make you trip. What can I do in the kitchen? Clean up any spills right away. Avoid walking on wet floors. Keep items that you use a lot in easy-to-reach places. If you need to reach something above you, use a strong step stool that has a grab bar. Keep electrical cords out of the way. Do not use floor polish or wax that makes floors slippery. If you must use wax, use non-skid floor wax. Do not have throw rugs and other things on the floor that can make you trip. What can I do with my stairs? Do not leave any items on the stairs. Make sure that there are handrails on both sides of the stairs and use them. Fix handrails that are broken or loose. Make sure that handrails are as long as the stairways. Check any carpeting to make sure  that it is firmly attached to the stairs. Fix any carpet that is loose or worn. Avoid having throw rugs at the top or bottom of the stairs. If you do have throw rugs, attach them to the floor with carpet tape. Make sure that you have a light switch at the top of the stairs and the bottom of the stairs. If you do not have them, ask someone to add them for you. What else can I do to help prevent falls? Wear shoes that: Do not have high heels. Have rubber bottoms. Are comfortable and fit you well. Are closed at the toe. Do not wear sandals. If you use a stepladder: Make sure that it is fully opened. Do not climb a closed stepladder. Make sure that both sides of the stepladder are locked into place. Ask someone to hold it for you, if possible. Clearly mark and make sure that you can see: Any grab bars or handrails. First and last steps. Where the edge of each step is. Use tools that help you move around (mobility aids) if they are needed. These include: Canes. Walkers. Scooters. Crutches. Turn on the lights when you go into a dark area. Replace any light bulbs as soon as they burn out. Set up your furniture so you have a clear path. Avoid moving your furniture around. If any of your floors are uneven, fix them. If there are any pets around you, be aware of where they are. Review your medicines with your doctor. Some medicines can make you feel dizzy. This can increase your chance of falling. Ask your doctor what other things that you can do to help prevent falls. This information is not intended to replace advice given to you by your health care provider. Make sure you discuss any questions you have with your health care provider. Document Released: 09/21/2009 Document Revised: 05/02/2016 Document Reviewed: 12/30/2014 Elsevier Interactive Patient Education  2017 [employer].

## 2023-04-29 ENCOUNTER — Encounter: Payer: Self-pay | Admitting: Nurse Practitioner

## 2023-04-29 ENCOUNTER — Ambulatory Visit (INDEPENDENT_AMBULATORY_CARE_PROVIDER_SITE_OTHER): Payer: Medicare HMO | Admitting: Nurse Practitioner

## 2023-04-29 VITALS — BP 100/62 | HR 95 | Temp 98.3°F | Ht 62.0 in | Wt 147.4 lb

## 2023-04-29 DIAGNOSIS — E7800 Pure hypercholesterolemia, unspecified: Secondary | ICD-10-CM

## 2023-04-29 DIAGNOSIS — Z6826 Body mass index (BMI) 26.0-26.9, adult: Secondary | ICD-10-CM | POA: Diagnosis not present

## 2023-04-29 DIAGNOSIS — Z2821 Immunization not carried out because of patient refusal: Secondary | ICD-10-CM | POA: Diagnosis not present

## 2023-04-29 DIAGNOSIS — Z0001 Encounter for general adult medical examination with abnormal findings: Secondary | ICD-10-CM | POA: Diagnosis not present

## 2023-04-29 DIAGNOSIS — Z0000 Encounter for general adult medical examination without abnormal findings: Secondary | ICD-10-CM

## 2023-04-29 DIAGNOSIS — I10 Essential (primary) hypertension: Secondary | ICD-10-CM | POA: Diagnosis not present

## 2023-04-29 DIAGNOSIS — R1312 Dysphagia, oropharyngeal phase: Secondary | ICD-10-CM | POA: Insufficient documentation

## 2023-04-29 DIAGNOSIS — E1142 Type 2 diabetes mellitus with diabetic polyneuropathy: Secondary | ICD-10-CM

## 2023-04-29 DIAGNOSIS — Z79899 Other long term (current) drug therapy: Secondary | ICD-10-CM

## 2023-04-29 LAB — POCT URINALYSIS DIP (CLINITEK)
Bilirubin, UA: NEGATIVE
Glucose, UA: NEGATIVE mg/dL
Ketones, POC UA: NEGATIVE mg/dL
Leukocytes, UA: NEGATIVE
Nitrite, UA: NEGATIVE
POC PROTEIN,UA: NEGATIVE
Spec Grav, UA: >=1.03 — AB (ref 1.010–1.025)
Urobilinogen, UA: 0.2 E.U./dL
pH, UA: 6 (ref 5.0–8.0)

## 2023-04-29 NOTE — Progress Notes (Addendum)
Yvonne Barron,acting as a Neurosurgeon for Yvonne Felts, FNP.,have documented all relevant documentation on the behalf of Yvonne Felts, FNP,as directed by  Yvonne Felts, FNP while in the presence of Yvonne Felts, FNP.   Subjective:     Patient ID: Yvonne Barron , female    DOB: [birth year] , 77 y.o.   MRN: [id number]   Chief Complaint  Patient presents with   Annual Exam    HPI  Patient presents today for a bp, dm, and chol check. Patient reports compliance with medications and has no other concerns today.  BP Readings from Last 3 Encounters: 04/29/23 : 100/62 04/24/23 : 116/62 01/24/23 : 122/67  The 10-year ASCVD risk score (Yvonne Barron, et al., 2019) is: 27%   Values used to calculate the score:     Age: 64 years     Sex: Female     Is Non-Hispanic African American: Yes     Diabetic: Yes     Tobacco smoker: No     Systolic Blood Pressure: 100 mmHg     Is BP treated: Yes     HDL Cholesterol: 78 mg/dL     Total Cholesterol: 200 mg/dL    Hypertension This is a chronic problem. The current episode started more than 1 year ago. The problem has been gradually worsening since onset. The problem is controlled. Pertinent negatives include no anxiety, chest pain or palpitations. Risk factors for coronary artery disease include diabetes mellitus. Past treatments include diuretics. There are no compliance problems.   Diabetes She presents for her follow-up diabetic visit. Diabetes type: prediabetes. Her disease course has been stable. There are no hypoglycemic associated symptoms. Pertinent negatives for hypoglycemia include no dizziness. There are no diabetic associated symptoms. Pertinent negatives for diabetes include no chest pain, no fatigue, no polydipsia, no polyphagia, no polyuria and no weakness. There are no hypoglycemic complications. Symptoms are stable. Diabetic complications include peripheral neuropathy. Risk factors for coronary artery disease include post-menopausal.  Current diabetic treatment includes oral Barron (monotherapy). She is compliant with treatment all of the time. Her weight is stable. She is following a generally healthy diet. When asked about meal planning, she reported none. She has not had a previous visit with a dietitian. Exercise: two days a week - 45 minutes a day. There is no change in her home blood glucose trend. ( Blood sugar this morning was 110, has been as low as 105. ) An ACE inhibitor/angiotensin II receptor blocker is being taken. She does not see a podiatrist.Eye exam is not current (Appt on July 19th).     Past Medical History:  Diagnosis Date   Arthritis    knee Lt   History of breast cancer 2009--  INVASIVE DUCTAL IN SITU--  S/P MASTECTOMY   NO RECURRENCE   Hypertension    Pre-diabetes    Urethral caruncle      Family History  Problem Relation Age of Onset   Diabetes Mother    Diabetes Father    Kidney disease Father    Diabetes Brother      Current Outpatient Medications:    Artificial Tear Ointment (DRY EYES OP), Place 1 drop into both eyes daily as needed (Dry eyes). Thera tears, Disp: , Rfl:    benzonatate (TESSALON) 100 MG capsule, Take 1 capsule (100 mg total) by mouth every 8 (eight) hours as needed for cough., Disp: 21 capsule, Rfl: 0   calcium carbonate (OS-CAL - DOSED IN MG OF ELEMENTAL CALCIUM) 1250 (500  Ca) MG tablet, Take 1 tablet by mouth daily with breakfast. , Disp: , Rfl:    cholecalciferol (VITAMIN D) 1000 units tablet, Take 1,000 Units by mouth 2 (two) times daily., Disp: , Rfl:    fluticasone (FLONASE) 50 MCG/ACT nasal spray, Place 1 spray into both nostrils daily., Disp: 16 g, Rfl: 0   glucose blood (ONETOUCH VERIO) test strip, Check blood sugar daily, Disp: 100 each, Rfl: 3   hydrochlorothiazide (HYDRODIURIL) 12.5 MG tablet, Take 1 tablet by mouth once daily, Disp: 90 tablet, Rfl: 0   Lancets (ONETOUCH DELICA PLUS LANCET33G) MISC, USE TO TEST BLOOD SUGAR DAILY, Disp: 100 each, Rfl: 3    Polyethyl Glycol-Propyl Glycol (SYSTANE) 0.4-0.3 % SOLN, Place 1 drop into both eyes at bedtime., Disp: , Rfl:    UNABLE TO FIND, Med Name: vitamins for eyes, Disp: , Rfl:    valACYclovir (VALTREX) 1000 MG tablet, Take 1,000 mg by mouth daily as needed (Cold sore)., Disp: , Rfl:    vitamin B-12 (CYANOCOBALAMIN) 500 MCG tablet, Take 500 mcg by mouth daily., Disp: , Rfl:    vitamin C (ASCORBIC ACID) 500 MG tablet, Take 500 mg by mouth daily., Disp: , Rfl:    albuterol (VENTOLIN HFA) 108 (90 Base) MCG/ACT inhaler, Inhale 1-2 puffs into the lungs every 6 (six) hours as needed for wheezing or shortness of breath. (Patient not taking: Reported on 04/24/2023), Disp: 1 each, Rfl: 0   amLODipine (NORVASC) 2.5 MG tablet, Take 1 tablet by mouth once daily, Disp: 30 tablet, Rfl: 0   No Known Allergies   The patient states she is status post hysterectomy.  No LMP recorded. Patient has had a hysterectomy.  Negative for Dysmenorrhea and Negative for Menorrhagia. Negative for: breast discharge, breast lump(s), breast pain and breast self exam. Associated symptoms include abnormal vaginal bleeding. Pertinent negatives include abnormal bleeding (hematology), anxiety, decreased libido, depression, difficulty falling sleep, dyspareunia, history of infertility, nocturia, sexual dysfunction, sleep disturbances, urinary incontinence, urinary urgency, vaginal discharge and vaginal itching. Diet regular; she is trying to eat more vegetables and cut back on the sugar intake. The patient states her exercise level is moderate 2 days a week with trainer at least 45 minutes.   The patient's tobacco use is:  Social History   Tobacco Use  Smoking Status Former   Packs/day: 2.00   Years: 20.00   Additional pack years: 0.00   Total pack years: 40.00   Types: Cigarettes   Quit date: 01/19/1987   Years since quitting: 36.3  Smokeless Tobacco Never   She has been exposed to passive smoke. The patient's alcohol use is:  Social  History   Substance and Sexual Activity  Alcohol Use No    Review of Systems  Constitutional:  Negative for fatigue.  Respiratory: Negative.    Cardiovascular:  Negative for chest pain, palpitations and leg swelling.  Endocrine: Negative for polydipsia, polyphagia and polyuria.  Neurological:  Negative for dizziness and weakness.  Psychiatric/Behavioral: Negative.       Today's Vitals   04/29/23 1515  BP: 100/62  Pulse: 95  Temp: 98.3 F (36.8 C)  TempSrc: Oral  Weight: 147 lb 6.4 oz (66.9 kg)  Height: 5\' 2"  (1.575 m)  PainSc: 0-No pain   Body mass index is 26.96 kg/m.  Wt Readings from Last 3 Encounters:  04/29/23 147 lb 6.4 oz (66.9 kg)  04/24/23 149 lb 3.2 oz (67.7 kg)  01/24/23 147 lb (66.7 kg)     Objective:  Physical  Exam Vitals reviewed.  Constitutional:      General: She is not in acute distress.    Appearance: Normal appearance. She is well-developed.  HENT:     Head: Normocephalic and atraumatic.     Right Ear: Hearing, tympanic membrane, ear canal and external ear normal. There is no impacted cerumen.     Left Ear: Hearing, tympanic membrane, ear canal and external ear normal. There is no impacted cerumen.     Nose: Nose normal.     Mouth/Throat:     Mouth: Mucous membranes are moist.  Eyes:     General: Lids are normal.     Extraocular Movements: Extraocular movements intact.     Conjunctiva/sclera: Conjunctivae normal.     Pupils: Pupils are equal, round, and reactive to light.     Funduscopic exam:    Right eye: No papilledema.        Left eye: No papilledema.  Neck:     Thyroid: No thyroid mass.     Vascular: No carotid bruit.  Cardiovascular:     Rate and Rhythm: Normal rate and regular rhythm.     Pulses: Normal pulses.     Heart sounds: Normal heart sounds. No murmur heard. Pulmonary:     Effort: Pulmonary effort is normal. No respiratory distress.     Breath sounds: Normal breath sounds. No wheezing.  Chest:     Chest wall: No  mass.  Breasts:    Tanner Score is 5.     Right: Normal.     Left: Absent.  Abdominal:     General: Abdomen is flat. Bowel sounds are normal.     Palpations: Abdomen is soft.  Musculoskeletal:        General: No swelling or tenderness. Normal range of motion.     Cervical back: Full passive range of motion without pain, normal range of motion and neck supple.     Right lower leg: No edema.     Left lower leg: No edema.  Skin:    General: Skin is warm and dry.     Capillary Refill: Capillary refill takes less than 2 seconds.  Neurological:     General: No focal deficit present.     Mental Status: She is alert and oriented to person, place, and time.     Cranial Nerves: No cranial nerve deficit.     Sensory: No sensory deficit.     Motor: No weakness.  Psychiatric:        Mood and Affect: Mood normal.        Behavior: Behavior normal.        Thought Content: Thought content normal.        Judgment: Judgment normal.         Assessment And Plan:     1. Encounter for general adult medical examination w/o abnormal findings Behavior modifications discussed and diet history reviewed.   Pt will continue to exercise regularly and modify diet with low GI, plant based foods and decrease intake of processed foods.  Recommend intake of daily multivitamin, Vitamin D, and calcium.  Recommend mammogram and colonoscopy for preventive screenings, as well as recommend immunizations that include influenza, TDAP, and Shingles  2. Essential hypertension Comments: Blood pressure is well-controlled we will continue current medications and consider weaning off of one of her blood pressure medications if remains stable. EKG done with NSR with HR 66 - EKG 12-Lead - POCT URINALYSIS DIP (CLINITEK) - Microalbumin / creatinine urine ratio -  CMP14+EGFR; Future - CMP14+EGFR  3. Elevated cholesterol Comments: Cholesterol levels are stable.  Continue current medications. - Lipid panel; Future - Lipid  panel  4. Oropharyngeal dysphagia Comments: She is having episodes of feeling like she is not swallowing well will order a barium swallow - SLP modified barium swallow; Future  5. Type 2 diabetes mellitus with diabetic polyneuropathy, without long-term current use of insulin (HCC) Comments: Hemoglobin A1c is stable.  Continue current medications - Hemoglobin A1c; Future - Hemoglobin A1c  6. Tetanus, diphtheria, and acellular pertussis (Tdap) vaccination declined  7. COVID-19 vaccination declined Declines covid 19 vaccine. Discussed risk of covid 37 and if she changes her mind about the vaccine to call the office. Education has been provided regarding the importance of this vaccine but patient still declined. Advised may receive this vaccine at local pharmacy or Health Dept.or vaccine clinic. Aware to provide a copy of the vaccination record if obtained from local pharmacy or Health Dept.  Encouraged to take multivitamin, vitamin d, vitamin c and zinc to increase immune system. Aware can call office if would like to have vaccine here at office. Verbalized acceptance and understanding.  8. BMI 26.0-26.9,adult  9. Other long term (current) drug therapy - CBC with Differential/Platelet; Future - CBC with Differential/Platelet    Return for 1 year physical, 6 month bp check; lab visit tomorrow labs in system. Patient was given opportunity to ask questions. Patient verbalized understanding of the plan and was able to repeat key elements of the plan. All questions were answered to their satisfaction.   Yvonne Felts, FNP   I, Yvonne Felts, FNP, have reviewed all documentation for this visit. The documentation on 04/29/23 for the exam, diagnosis, procedures, and orders are all accurate and complete.   THE PATIENT IS ENCOURAGED TO PRACTICE SOCIAL DISTANCING DUE TO THE COVID-19 PANDEMIC.

## 2023-04-30 ENCOUNTER — Other Ambulatory Visit: Payer: Medicare HMO

## 2023-04-30 DIAGNOSIS — E7800 Pure hypercholesterolemia, unspecified: Secondary | ICD-10-CM | POA: Diagnosis not present

## 2023-04-30 DIAGNOSIS — Z79899 Other long term (current) drug therapy: Secondary | ICD-10-CM | POA: Diagnosis not present

## 2023-04-30 DIAGNOSIS — E1142 Type 2 diabetes mellitus with diabetic polyneuropathy: Secondary | ICD-10-CM | POA: Diagnosis not present

## 2023-04-30 DIAGNOSIS — I10 Essential (primary) hypertension: Secondary | ICD-10-CM | POA: Diagnosis not present

## 2023-04-30 LAB — MICROALBUMIN / CREATININE URINE RATIO
Creatinine, Urine: 135.5 mg/dL
Microalb/Creat Ratio: 4 mg/g creat (ref 0–29)
Microalbumin, Urine: 5.7 ug/mL

## 2023-05-01 ENCOUNTER — Other Ambulatory Visit (HOSPITAL_COMMUNITY): Payer: Self-pay | Admitting: *Deleted

## 2023-05-01 DIAGNOSIS — R1310 Dysphagia, unspecified: Secondary | ICD-10-CM

## 2023-05-01 LAB — LIPID PANEL
Chol/HDL Ratio: 2.6 ratio (ref 0.0–4.4)
Cholesterol, Total: 226 mg/dL — ABNORMAL HIGH (ref 100–199)
HDL: 88 mg/dL (ref >39)
LDL Chol Calc (NIH): 130 mg/dL — ABNORMAL HIGH (ref 0–99)
Triglycerides: 44 mg/dL (ref 0–149)
VLDL Cholesterol Cal: 8 mg/dL (ref 5–40)

## 2023-05-01 LAB — CMP14+EGFR
ALT: 10 IU/L (ref 0–32)
AST: 16 IU/L (ref 0–40)
Albumin/Globulin Ratio: 1.9 (ref 1.2–2.2)
Albumin: 4.5 g/dL (ref 3.8–4.8)
Alkaline Phosphatase: 87 IU/L (ref 44–121)
BUN/Creatinine Ratio: 16 (ref 12–28)
BUN: 11 mg/dL (ref 8–27)
Bilirubin Total: 0.4 mg/dL (ref 0.0–1.2)
CO2: 22 mmol/L (ref 20–29)
Calcium: 9.7 mg/dL (ref 8.7–10.3)
Chloride: 101 mmol/L (ref 96–106)
Creatinine, Ser: 0.7 mg/dL (ref 0.57–1.00)
Globulin, Total: 2.4 g/dL (ref 1.5–4.5)
Glucose: 99 mg/dL (ref 70–99)
Potassium: 4.2 mmol/L (ref 3.5–5.2)
Sodium: 140 mmol/L (ref 134–144)
Total Protein: 6.9 g/dL (ref 6.0–8.5)
eGFR: 90 mL/min/{1.73_m2} (ref >59)

## 2023-05-01 LAB — CBC WITH DIFFERENTIAL/PLATELET
Basophils Absolute: 0 10*3/uL (ref 0.0–0.2)
Basos: 1 %
EOS (ABSOLUTE): 0.2 10*3/uL (ref 0.0–0.4)
Eos: 4 %
Hematocrit: 36.5 % (ref 34.0–46.6)
Hemoglobin: 11.8 g/dL (ref 11.1–15.9)
Immature Grans (Abs): 0 10*3/uL (ref 0.0–0.1)
Immature Granulocytes: 0 %
Lymphocytes Absolute: 1.7 10*3/uL (ref 0.7–3.1)
Lymphs: 34 %
MCH: 26.9 pg (ref 26.6–33.0)
MCHC: 32.3 g/dL (ref 31.5–35.7)
MCV: 83 fL (ref 79–97)
Monocytes Absolute: 0.4 10*3/uL (ref 0.1–0.9)
Monocytes: 9 %
Neutrophils Absolute: 2.6 10*3/uL (ref 1.4–7.0)
Neutrophils: 52 %
Platelets: 357 10*3/uL (ref 150–450)
RBC: 4.39 x10E6/uL (ref 3.77–5.28)
RDW: 13.1 % (ref 11.7–15.4)
WBC: 5 10*3/uL (ref 3.4–10.8)

## 2023-05-01 LAB — HEMOGLOBIN A1C
Est. average glucose Bld gHb Est-mCnc: 131 mg/dL
Hgb A1c MFr Bld: 6.2 % — ABNORMAL HIGH (ref 4.8–5.6)

## 2023-05-06 ENCOUNTER — Other Ambulatory Visit: Payer: Self-pay | Admitting: Nurse Practitioner

## 2023-05-06 ENCOUNTER — Ambulatory Visit (HOSPITAL_COMMUNITY): Admission: RE | Admit: 2023-05-06 | Payer: Medicare HMO | Source: Ambulatory Visit

## 2023-05-06 ENCOUNTER — Ambulatory Visit (HOSPITAL_COMMUNITY)
Admission: RE | Admit: 2023-05-06 | Discharge: 2023-05-06 | Disposition: A | Discharge status: Home / Self Care | Payer: Medicare HMO | Source: Ambulatory Visit | Attending: Nurse Practitioner | Admitting: Nurse Practitioner

## 2023-05-06 DIAGNOSIS — R1312 Dysphagia, oropharyngeal phase: Secondary | ICD-10-CM

## 2023-05-22 DIAGNOSIS — M1711 Unilateral primary osteoarthritis, right knee: Secondary | ICD-10-CM | POA: Diagnosis not present

## 2023-05-22 DIAGNOSIS — M25561 Pain in right knee: Secondary | ICD-10-CM | POA: Diagnosis not present

## 2023-05-22 DIAGNOSIS — M1712 Unilateral primary osteoarthritis, left knee: Secondary | ICD-10-CM | POA: Diagnosis not present

## 2023-06-05 ENCOUNTER — Other Ambulatory Visit: Payer: Self-pay | Admitting: Nurse Practitioner

## 2023-06-05 DIAGNOSIS — I10 Essential (primary) hypertension: Secondary | ICD-10-CM

## 2023-06-25 DIAGNOSIS — L728 Other follicular cysts of the skin and subcutaneous tissue: Secondary | ICD-10-CM | POA: Diagnosis not present

## 2023-06-25 DIAGNOSIS — D225 Melanocytic nevi of trunk: Secondary | ICD-10-CM | POA: Diagnosis not present

## 2023-06-25 DIAGNOSIS — L814 Other melanin hyperpigmentation: Secondary | ICD-10-CM | POA: Diagnosis not present

## 2023-06-25 DIAGNOSIS — L304 Erythema intertrigo: Secondary | ICD-10-CM | POA: Diagnosis not present

## 2023-06-25 DIAGNOSIS — L821 Other seborrheic keratosis: Secondary | ICD-10-CM | POA: Diagnosis not present

## 2023-06-27 DIAGNOSIS — E1142 Type 2 diabetes mellitus with diabetic polyneuropathy: Secondary | ICD-10-CM | POA: Diagnosis not present

## 2023-06-27 DIAGNOSIS — Z87891 Personal history of nicotine dependence: Secondary | ICD-10-CM | POA: Diagnosis not present

## 2023-06-27 DIAGNOSIS — Z8249 Family history of ischemic heart disease and other diseases of the circulatory system: Secondary | ICD-10-CM | POA: Diagnosis not present

## 2023-06-27 DIAGNOSIS — I129 Hypertensive chronic kidney disease with stage 1 through stage 4 chronic kidney disease, or unspecified chronic kidney disease: Secondary | ICD-10-CM | POA: Diagnosis not present

## 2023-06-27 DIAGNOSIS — M1990 Unspecified osteoarthritis, unspecified site: Secondary | ICD-10-CM | POA: Diagnosis not present

## 2023-06-27 DIAGNOSIS — Z008 Encounter for other general examination: Secondary | ICD-10-CM | POA: Diagnosis not present

## 2023-06-27 DIAGNOSIS — N181 Chronic kidney disease, stage 1: Secondary | ICD-10-CM | POA: Diagnosis not present

## 2023-06-27 DIAGNOSIS — Z9012 Acquired absence of left breast and nipple: Secondary | ICD-10-CM | POA: Diagnosis not present

## 2023-06-27 DIAGNOSIS — H3530 Unspecified macular degeneration: Secondary | ICD-10-CM | POA: Diagnosis not present

## 2023-06-27 DIAGNOSIS — E785 Hyperlipidemia, unspecified: Secondary | ICD-10-CM | POA: Diagnosis not present

## 2023-06-27 DIAGNOSIS — E1122 Type 2 diabetes mellitus with diabetic chronic kidney disease: Secondary | ICD-10-CM | POA: Diagnosis not present

## 2023-07-08 ENCOUNTER — Other Ambulatory Visit: Payer: Self-pay

## 2023-07-08 ENCOUNTER — Ambulatory Visit
Admission: RE | Admit: 2023-07-08 | Discharge: 2023-07-08 | Disposition: A | Discharge status: Home / Self Care | Payer: Medicare HMO | Source: Ambulatory Visit | Attending: Emergency Medicine | Admitting: Emergency Medicine

## 2023-07-08 VITALS — BP 150/85 | HR 66 | Temp 98.3°F | Resp 18 | Ht 63.0 in | Wt 149.0 lb

## 2023-07-08 DIAGNOSIS — S161XXA Strain of muscle, fascia and tendon at neck level, initial encounter: Secondary | ICD-10-CM

## 2023-07-08 MED ORDER — TIZANIDINE HCL 4 MG PO TABS: 4.0000 mg | ORAL_TABLET | Freq: Four times a day (QID) | ORAL | 0 refills | Status: DC | PRN

## 2023-07-08 MED ORDER — IBUPROFEN 600 MG PO TABS: 600.0000 mg | ORAL_TABLET | Freq: Three times a day (TID) | ORAL | 0 refills | Status: DC | PRN

## 2023-07-08 NOTE — ED Triage Notes (Signed)
"  Started Sunday starting at left sided back of my head extending down neck, now hard to turn my neck, now with some radiation down spine". No injury known.

## 2023-07-08 NOTE — Discharge Instructions (Signed)
The tizanidine (muscle relaxer) may make you sleepy - take it only at night if you need to drive or work.   Follow up with sports medicine.   Movement and gentle stretching is ok and will help the tight muscle loosen up.   You can take tylenol per package directions for pain in addition to the ibuprofen.

## 2023-07-12 NOTE — ED Provider Notes (Signed)
[facility]    CSN: [id number] Arrival date & time: 07/08/23  1215      History   Chief Complaint Chief Complaint  Patient presents with   Neck Pain    HPI Yvonne Barron is a 77 y.o. female. "Started Sunday starting at left sided back of my head extending down neck, now hard to turn my neck, now with some radiation down spine". Denies injury, woke up with pain. Is leaving for vacation in 2 days and wants to get this fixed before she travels. Denies weakness or numbness.     Neck Pain   Past Medical History:  Diagnosis Date   Arthritis    knee Lt   History of breast cancer 2009--  INVASIVE DUCTAL IN SITU--  S/P MASTECTOMY   NO RECURRENCE   Hypertension    Pre-diabetes    Urethral caruncle     Patient Active Problem List   Diagnosis Date Noted   Tetanus, diphtheria, and acellular pertussis (Tdap) vaccination declined 04/29/2023   Elevated cholesterol 04/29/2023   Oropharyngeal dysphagia 04/29/2023   Diabetic polyneuropathy associated with type 2 diabetes mellitus (HCC) 01/14/2023   H/O total knee replacement, left 07/28/2020   Type 2 diabetes mellitus with diabetic polyneuropathy, without long-term current use of insulin (HCC) 04/15/2019   Pain in right knee 11/13/2018   Vitamin D deficiency 08/29/2018   Cervicalgia 08/29/2018   Decreased estrogen level 08/29/2018   Urinary frequency 08/29/2018   Urethral caruncle 07/10/2012   PARESTHESIA 07/01/2008   Breast cancer in situ 12/10/2007   Essential hypertension 06/17/2007    Past Surgical History:  Procedure Laterality Date   CYSTOSCOPY WITH URETHRAL CARUNCLE  07/10/2012   Procedure: CYSTOSCOPY WITH URETHRAL CARUNCLE;  Surgeon: Garnett Farm, MD;  Location: [facility];  Service: Urology;  Laterality: N/A;    EXCISION OF CARUNCLE    LEFT TOTAL MASTECTOMY/ LEFT AXILLARY SENTINEL LYMPH NODE BX  03-01-2008   INVASIVE  DUCTAL CARCINOMA IN SITU   LEFT WRIST SURG  2008   REPAIR  NERVE   MASTECTOMY Left 2009   TOE SURGERY Right 2017   Great toe   TOTAL KNEE ARTHROPLASTY Left 07/28/2020   Procedure: TOTAL KNEE ARTHROPLASTY;  Surgeon: Beverely Low, MD;  Location: [facility];  Service: Orthopedics;  Laterality: Left;   VAGINAL HYSTERECTOMY  1998   W/ BILATERAL SALPINGOOPHECTOMY    OB History   No obstetric history on file.      Home Medications    Prior to Admission medications   Medication Sig Start Date End Date Taking? Authorizing Provider  hydrochlorothiazide (HYDRODIURIL) 12.5 MG tablet Take 1 tablet by mouth once daily 06/06/23  Yes [name], FNP  ibuprofen (ADVIL) 600 MG tablet Take 1 tablet (600 mg total) by mouth every 8 (eight) hours as needed. 07/08/23  Yes Cathlyn Parsons, NP  tiZANidine (ZANAFLEX) 4 MG tablet Take 1 tablet (4 mg total) by mouth every 6 (six) hours as needed for muscle spasms. 07/08/23  Yes Cathlyn Parsons, NP  albuterol (VENTOLIN HFA) 108 (90 Base) MCG/ACT inhaler Inhale 1-2 puffs into the lungs every 6 (six) hours as needed for wheezing or shortness of breath. Patient not taking: Reported on 04/24/2023 12/07/21   Rhys Martini, PA-C  amLODipine [facility]) 2.5 MG tablet Take 1 tablet by mouth once daily 05/06/23   [name], FNP  Artificial Tear Ointment (DRY EYES OP) Place 1 drop into both eyes daily as needed (Dry eyes). Thera tears  [provider]  benzonatate (TESSALON) 100 MG capsule Take 1 capsule (100 mg total) by mouth every 8 (eight) hours as needed for cough. 01/03/23   Gustavus Bryant, FNP  calcium carbonate (OS-CAL - DOSED IN MG OF ELEMENTAL CALCIUM) 1250 (500 Ca) MG tablet Take 1 tablet by mouth daily with breakfast.     [provider]  cholecalciferol (VITAMIN D) 1000 units tablet Take 1,000 Units by mouth 2 (two) times daily.    [provider]  fluticasone (FLONASE) 50 MCG/ACT nasal spray Place 1 spray into both nostrils daily. 01/03/23   Gustavus Bryant, FNP  glucose blood [facility] VERIO)  test strip Check blood sugar daily 11/06/22   [name], FNP  Lancets [facility] DELICA PLUS [place]) MISC USE TO TEST BLOOD SUGAR DAILY 07/31/22   [name], FNP  Polyethyl Glycol-Propyl Glycol (SYSTANE) 0.4-0.3 % SOLN Place 1 drop into both eyes at bedtime.    [provider]  UNABLE TO FIND Med Name: vitamins for eyes    [provider]  valACYclovir (VALTREX) 1000 MG tablet Take 1,000 mg by mouth daily as needed (Cold sore).    [provider]  vitamin B-12 (CYANOCOBALAMIN) 500 MCG tablet Take 500 mcg by mouth daily.    [provider]  vitamin C (ASCORBIC ACID) 500 MG tablet Take 500 mg by mouth daily.    [provider]    Family History Family History  Problem Relation Age of Onset   Diabetes Mother    Diabetes Father    Kidney disease Father    Diabetes Brother     Social History Social History   Tobacco Use   Smoking status: Former    Current packs/day: 0.00    Average packs/day: 2.0 packs/day for 20.0 years (40.0 ttl pk-yrs)    Types: Cigarettes    Start date: 01/19/1967    Quit date: 01/19/1987    Years since quitting: 36.5   Smokeless tobacco: Never  Vaping Use   Vaping status: Never Used  Substance Use Topics   Alcohol use: No   Drug use: Never     Allergies   Patient has no known allergies.   Review of Systems Review of Systems  Musculoskeletal:  Positive for neck pain.     Physical Exam Triage Vital Signs ED Triage Vitals  Encounter Vitals Group     BP 07/08/23 1254 (!) 153/87     Systolic BP Percentile --      Diastolic BP Percentile --      Pulse Rate 07/08/23 1254 66     Resp 07/08/23 1254 18     Temp 07/08/23 1254 98.3 F (36.8 C)     Temp Source 07/08/23 1254 Oral     SpO2 07/08/23 1254 96 %     Weight 07/08/23 1253 149 lb (67.6 kg)     Height 07/08/23 1253 5\' 3"  (1.6 m)     Head Circumference --      Peak Flow --      Pain Score 07/08/23 1253 10     Pain Loc --      Pain  Education --      Exclude from Growth Chart --    No data found.  Updated Vital Signs BP (!) 150/85 (BP Location: Left Arm)   Pulse 66   Temp 98.3 F (36.8 C) (Oral)   Resp 18   Ht 5\' 3"  (1.6 m)   Wt 149 lb (67.6 kg)  SpO2 96%   BMI 26.39 kg/m   Visual Acuity Right Eye Distance:   Left Eye Distance:   Bilateral Distance:    Right Eye Near:   Left Eye Near:    Bilateral Near:     Physical Exam Constitutional:      Appearance: Normal appearance.  HENT:     Head: Normocephalic and atraumatic.  Neck:     Comments: Muscle tightness and tenderness to palpation L posterior neck.  Musculoskeletal:     Cervical back: No rigidity. Muscular tenderness present. No spinous process tenderness. Decreased range of motion.  Neurological:     Mental Status: She is alert.      UC Treatments / Results  Labs (all labs ordered are listed, but only abnormal results are displayed) Labs Reviewed - No data to display  EKG   Radiology No results found.  Procedures Procedures (including critical care time)  Medications Ordered in UC Medications - No data to display  Initial Impression / Assessment and Plan / UC Course  I have reviewed the triage vital signs and the nursing notes.  Pertinent labs & imaging results that were available during my care of the patient were reviewed by me and considered in my medical decision making (see chart for details).    No red flag symptoms. I suspect muscle strain. Discussed supportive measures  Final Clinical Impressions(s) / UC Diagnoses   Final diagnoses:  Acute strain of neck muscle, initial encounter     Discharge Instructions      The tizanidine (muscle relaxer) may make you sleepy - take it only at night if you need to drive or work.   Follow up with sports medicine.   Movement and gentle stretching is ok and will help the tight muscle loosen up.   You can take tylenol per package directions for pain in addition to the  ibuprofen.    ED Prescriptions     Medication Sig Dispense Auth. Provider   tiZANidine (ZANAFLEX) 4 MG tablet Take 1 tablet (4 mg total) by mouth every 6 (six) hours as needed for muscle spasms. 30 tablet Cathlyn Parsons, NP   ibuprofen (ADVIL) 600 MG tablet Take 1 tablet (600 mg total) by mouth every 8 (eight) hours as needed. 30 tablet Cathlyn Parsons, NP      PDMP not reviewed this encounter.   Cathlyn Parsons, NP 07/12/23 1704

## 2023-07-28 ENCOUNTER — Other Ambulatory Visit: Payer: Self-pay | Admitting: Nurse Practitioner

## 2023-08-20 DIAGNOSIS — H35033 Hypertensive retinopathy, bilateral: Secondary | ICD-10-CM | POA: Diagnosis not present

## 2023-08-20 DIAGNOSIS — H353132 Nonexudative age-related macular degeneration, bilateral, intermediate dry stage: Secondary | ICD-10-CM | POA: Diagnosis not present

## 2023-08-20 DIAGNOSIS — E119 Type 2 diabetes mellitus without complications: Secondary | ICD-10-CM | POA: Diagnosis not present

## 2023-08-20 DIAGNOSIS — H25813 Combined forms of age-related cataract, bilateral: Secondary | ICD-10-CM | POA: Diagnosis not present

## 2023-08-20 LAB — HM DIABETES EYE EXAM

## 2023-08-29 ENCOUNTER — Telehealth: Payer: Self-pay | Admitting: Pharmacist

## 2023-08-29 DIAGNOSIS — E1142 Type 2 diabetes mellitus with diabetic polyneuropathy: Secondary | ICD-10-CM

## 2023-08-29 DIAGNOSIS — I10 Essential (primary) hypertension: Secondary | ICD-10-CM

## 2023-08-29 NOTE — Progress Notes (Unsigned)
08/29/2023  Patient ID: Yvonne Barron, female   DOB: January 04, 1946, 77 y.o.   MRN: [id number]  Patient appearing on report for [employer] - Hypertension Control report due to last documented ambulatory blood pressure of 150/85 on 07/08/23. Next appointment with PCP is 10/30/23.   Outreached patient to discuss hypertension control and medication management.   Current antihypertensives: Amlodipine 2.5 mg, hydrochlorothiazide 12.5 mg  Patient has an automated upper arm home BP machine.  Current blood pressure readings: In morning- BP is 130-140's/60-70's; In afternoon- BP is 120's/60-70's; checks it twice a day BP today was 128/74   Patient denies hypotensive signs and symptoms including dizziness, lightheadedness.  Patient denies hypertensive symptoms including headache, chest pain, shortness of breath.  Patient denies side effects related to medications.     Assessment/Plan: - Currently controlled - - Reviewed goal blood pressure <130/80 - Reviewed appropriate administration of medication regimen - Reviewed appropriate home BP monitoring technique (avoid caffeine, smoking, and exercise for 30 minutes before checking, rest for at least 5 minutes before taking BP, sit with feet flat on the floor and back against a hard surface, uncross legs, and rest arm on flat surface)   *Of note patient requesting test strips refills and another referral for Barium swallow test and hearing test due to continued concerns  Discussed A1c will be re-collected at next PCP visit in November- okay for 6 months due to good A1c levels   Marlowe Aschoff, PharmD [facility] Phone Number: [phone]

## 2023-09-04 ENCOUNTER — Other Ambulatory Visit: Payer: Self-pay | Admitting: Nurse Practitioner

## 2023-09-04 DIAGNOSIS — I10 Essential (primary) hypertension: Secondary | ICD-10-CM

## 2023-09-04 DIAGNOSIS — E1142 Type 2 diabetes mellitus with diabetic polyneuropathy: Secondary | ICD-10-CM

## 2023-09-04 MED ORDER — AMLODIPINE BESYLATE 2.5 MG PO TABS: 2.5000 mg | ORAL_TABLET | Freq: Every day | ORAL | 1 refills | Status: DC

## 2023-09-04 MED ORDER — ONETOUCH VERIO VI STRP
ORAL_STRIP | 3 refills | Status: DC
Start: 2023-09-04 — End: 2024-05-04

## 2023-09-04 MED ORDER — HYDROCHLOROTHIAZIDE 12.5 MG PO TABS
12.5000 mg | ORAL_TABLET | Freq: Every day | ORAL | 1 refills | Status: DC
Start: 2023-09-04 — End: 2024-03-01

## 2023-09-23 ENCOUNTER — Other Ambulatory Visit: Payer: Self-pay | Admitting: Nurse Practitioner

## 2023-09-23 DIAGNOSIS — H9193 Unspecified hearing loss, bilateral: Secondary | ICD-10-CM

## 2023-09-23 DIAGNOSIS — Z1231 Encounter for screening mammogram for malignant neoplasm of breast: Secondary | ICD-10-CM

## 2023-10-21 ENCOUNTER — Ambulatory Visit
Admission: RE | Admit: 2023-10-21 | Discharge: 2023-10-21 | Disposition: A | Discharge status: Home / Self Care | Payer: Medicare HMO | Source: Ambulatory Visit | Attending: Nurse Practitioner | Admitting: Nurse Practitioner

## 2023-10-21 DIAGNOSIS — Z1231 Encounter for screening mammogram for malignant neoplasm of breast: Secondary | ICD-10-CM | POA: Diagnosis not present

## 2023-10-23 ENCOUNTER — Encounter: Payer: Self-pay | Admitting: Nurse Practitioner

## 2023-10-28 ENCOUNTER — Ambulatory Visit: Payer: Medicare HMO | Attending: Nurse Practitioner | Admitting: Audiology

## 2023-10-28 DIAGNOSIS — H903 Sensorineural hearing loss, bilateral: Secondary | ICD-10-CM | POA: Insufficient documentation

## 2023-10-28 NOTE — Procedures (Signed)
  Outpatient Audiology and [facility] [address] [place], [place]  [address] [phone]  AUDIOLOGICAL  EVALUATION  NAME: Yvonne Barron     DOB:   1946-07-10      MRN: [id number]                                                                                     DATE: 10/28/2023     REFERENT: [name], FNP STATUS: Outpatient DIAGNOSIS: Sensorineural hearing loss, bilateral    History: Malorie was seen for an audiological evaluation due to difficulty hearing. Ariyona reports difficulty hearing in group meetings especially if she is sitting far away from the speaker and they are using a microphone. Blakesley reports difficulty hearing parts of conversations and hearing television and music. Oktober reports intermittent tinnitus. She denies otalgia, aural fullness, and dizziness.   Evaluation:  Otoscopy showed a clear view of the tympanic membranes, bilaterally Tympanometry results were consistent with normal middle ear pressure and normal mobility (Type A), bilaterally.  Audiometric testing was completed using Conventional Audiometry techniques with insert earphones and TDH headphones. Test results are consistent in the right ear with normal hearing sensitivity from [phone] sloping to a mild sensorineural hearing loss and consistent in the left ear with normal hearing sensitivity at [phone] sloping to a mild to moderate sensorineural hearing loss. Sensorineural asymmetry noted at 2000-8000 Hz, worse in the left ear. Speech Recognition Thresholds were obtained at 20 dB HL in the right ear and at 25  dB HL in the left ear. Word Recognition Testing was completed at 70 dB HL and Kailia scored 100%, bilaterally.    Results:  The test results were reviewed with Eber Jones. She was given a copy of her audiogram today. Test results are consistent in the right ear with normal hearing sensitivity from [phone] sloping to a mild sensorineural hearing loss and  consistent in the left ear with normal hearing sensitivity at [phone] sloping to a mild to moderate sensorineural hearing loss. Sensorineural asymmetry noted at 2000-8000 Hz, worse in the left ear. Ferrol will have hearing and communication difficulty in adverse listening environments. She will benefit from the use of a hearing aid in the left ear if motivated. A referral to an ENT was reviewed due to asymmetric hearing loss.   Recommendations: 1.   Referral to an ENT due to asymmetric hearing loss 2.   Communication Needs Assessment with an Audiologist if interested in pursing a hearing aid 3.   Monitor hearing every 2-3 years.    30 minutes spent testing and counseling on results.   If you have any questions please feel free to contact me at [phone].  Marton Redwood Audiologist, Au.D., CCC-A 10/28/2023  2:00 PM  Cc: [name], FNP

## 2023-10-29 NOTE — Progress Notes (Signed)
Madelaine Bhat, CMA,acting as a Neurosurgeon for [name], FNP.,have documented all relevant documentation on the behalf of [name], FNP,as directed by  [name], FNP while in the presence of [name], FNP.  Subjective:  Patient ID: Yvonne Barron , female    DOB: 1946/07/25 , 77 y.o.   MRN: [id number]  Chief Complaint  Patient presents with   Hypertension    HPI  Patient presents today for a bp and dm follow up, Patient reports compliance with medication. Patient denies any chest pain, SOB, or headaches. Patient has no concerns today. She is taking her blood pressure at home average 140/65. She has not been taking her amlodipine due to her saying her blood. She has been taking [place] for her cholesterol   Hypertension This is a chronic problem. The current episode started more than 1 year ago. The problem has been gradually improving since onset. The problem is controlled. Pertinent negatives include no anxiety, chest pain or palpitations. There are no associated agents to hypertension. Risk factors for coronary artery disease include diabetes mellitus. Past treatments include calcium channel blockers and diuretics.     Past Medical History:  Diagnosis Date   Arthritis    knee Lt   History of breast cancer 2009--  INVASIVE DUCTAL IN SITU--  S/P MASTECTOMY   NO RECURRENCE   Hypertension    Pre-diabetes    Urethral caruncle      Family History  Problem Relation Age of Onset   Diabetes Mother    Diabetes Father    Kidney disease Father    Diabetes Brother      Current Outpatient Medications:    amLODipine (NORVASC) 2.5 MG tablet, Take 1 tablet (2.5 mg total) by mouth daily., Disp: 90 tablet, Rfl: 1   Artificial Tear Ointment (DRY EYES OP), Place 1 drop into both eyes daily as needed (Dry eyes). Thera tears, Disp: , Rfl:    benzonatate (TESSALON) 100 MG capsule, Take 1 capsule (100 mg total) by mouth every 8 (eight) hours as needed for cough., Disp: 21  capsule, Rfl: 0   calcium carbonate (OS-CAL - DOSED IN MG OF ELEMENTAL CALCIUM) 1250 (500 Ca) MG tablet, Take 1 tablet by mouth daily with breakfast. , Disp: , Rfl:    cholecalciferol (VITAMIN D) 1000 units tablet, Take 1,000 Units by mouth 2 (two) times daily., Disp: , Rfl:    fluticasone (FLONASE) 50 MCG/ACT nasal spray, Place 1 spray into both nostrils daily., Disp: 16 g, Rfl: 0   glucose blood (ONETOUCH VERIO) test strip, Check blood sugar daily, Disp: 100 each, Rfl: 3   hydrochlorothiazide (HYDRODIURIL) 12.5 MG tablet, Take 1 tablet (12.5 mg total) by mouth daily., Disp: 90 tablet, Rfl: 1   ibuprofen (ADVIL) 600 MG tablet, Take 1 tablet (600 mg total) by mouth every 8 (eight) hours as needed., Disp: 30 tablet, Rfl: 0   Lancets (ONETOUCH DELICA PLUS LANCET33G) MISC, USE TO TEST BLOOD SUGAR DAILY, Disp: 100 each, Rfl: 3   Polyethyl Glycol-Propyl Glycol (SYSTANE) 0.4-0.3 % SOLN, Place 1 drop into both eyes at bedtime., Disp: , Rfl:    tiZANidine (ZANAFLEX) 4 MG tablet, Take 1 tablet (4 mg total) by mouth every 6 (six) hours as needed for muscle spasms., Disp: 30 tablet, Rfl: 0   UNABLE TO FIND, Med Name: vitamins for eyes, Disp: , Rfl:    valACYclovir (VALTREX) 1000 MG tablet, Take 1,000 mg by mouth daily as needed (Cold sore)., Disp: , Rfl:  vitamin B-12 (CYANOCOBALAMIN) 500 MCG tablet, Take 500 mcg by mouth daily., Disp: , Rfl:    vitamin C (ASCORBIC ACID) 500 MG tablet, Take 500 mg by mouth daily., Disp: , Rfl:    albuterol (VENTOLIN HFA) 108 (90 Base) MCG/ACT inhaler, Inhale 1-2 puffs into the lungs every 6 (six) hours as needed for wheezing or shortness of breath. (Patient not taking: Reported on 04/24/2023), Disp: 1 each, Rfl: 0   No Known Allergies   Review of Systems  Constitutional: Negative.  Negative for fatigue.  HENT: Negative.    Eyes: Negative.   Respiratory: Negative.    Cardiovascular: Negative.  Negative for chest pain and palpitations.  Gastrointestinal: Negative.    Endocrine: Negative for polydipsia, polyphagia and polyuria.  Psychiatric/Behavioral: Negative.       Today's Vitals   10/30/23 1447  BP: 128/70  Pulse: 64  Temp: 97.9 F (36.6 C)  TempSrc: Oral  Weight: 150 lb 12.8 oz (68.4 kg)  Height: 5\' 3"  (1.6 m)  PainSc: 0-No pain   Body mass index is 26.71 kg/m.  Wt Readings from Last 3 Encounters:  10/30/23 150 lb 12.8 oz (68.4 kg)  07/08/23 149 lb (67.6 kg)  04/29/23 147 lb 6.4 oz (66.9 kg)    The 10-year ASCVD risk score (Arnett DK, et al., 2019) is: 37.8%   Values used to calculate the score:     Age: 67 years     Sex: Female     Is Non-Hispanic African American: Yes     Diabetic: Yes     Tobacco smoker: No     Systolic Blood Pressure: 128 mmHg     Is BP treated: Yes     HDL Cholesterol: 85 mg/dL     Total Cholesterol: 200 mg/dL  Objective:  Physical Exam Vitals reviewed.  Constitutional:      General: She is not in acute distress.    Appearance: Normal appearance.  Cardiovascular:     Rate and Rhythm: Normal rate and regular rhythm.     Pulses: Normal pulses.     Heart sounds: Normal heart sounds. No murmur heard. Pulmonary:     Effort: Pulmonary effort is normal. No respiratory distress.     Breath sounds: Normal breath sounds. No wheezing.  Skin:    General: Skin is warm and dry.     Capillary Refill: Capillary refill takes less than 2 seconds.  Neurological:     General: No focal deficit present.     Mental Status: She is alert and oriented to person, place, and time.     Cranial Nerves: No cranial nerve deficit.     Motor: No weakness.  Psychiatric:        Mood and Affect: Mood normal.        Behavior: Behavior normal.        Thought Content: Thought content normal.        Judgment: Judgment normal.         Assessment And Plan:  Essential hypertension Assessment & Plan: Blood pressure is well controlled, continue current medications  Orders: -     Basic metabolic panel  Type 2 diabetes mellitus  with diabetic polyneuropathy, without long-term current use of insulin (HCC) Assessment & Plan: HgbA1c is slightly improved, continue focusing on healthy diet and regular exercise.  Orders: -     Hemoglobin A1c  Mixed hyperlipidemia Assessment & Plan: Cholesterol levels have been elevated, she is not on a statin. Will consider referral for a calcium  risk score scan pending her labs. Her ASCVD risk score is 37.8%  Orders: -     Lipid panel  Need for influenza vaccination Assessment & Plan: Influenza vaccine administered Encouraged to take Tylenol as needed for fever or muscle aches.   Orders: -     Flu Vaccine Trivalent High Dose (Fluad)  COVID-19 vaccination declined Assessment & Plan: Declines covid 19 vaccine. Discussed risk of covid 88 and if she changes her mind about the vaccine to call the office. Education has been provided regarding the importance of this vaccine but patient still declined. Advised may receive this vaccine at local pharmacy or Health Dept.or vaccine clinic. Aware to provide a copy of the vaccination record if obtained from local pharmacy or Health Dept.  Encouraged to take multivitamin, vitamin d, vitamin c and zinc to increase immune system. Aware can call office if would like to have vaccine here at office. Verbalized acceptance and understanding.     Need for Tdap vaccination Assessment & Plan: Will give tetanus vaccine today while in office. Refer to order management. TDAP will be administered to adults 56-69 years old every 10 years.   Orders: -     Tdap vaccine greater than or equal to 7yo IM   Return for controlled DM check 4 months; schedule lab visit for tomorrow at 1130.  Patient was given opportunity to ask questions. Patient verbalized understanding of the plan and was able to repeat key elements of the plan. All questions were answered to their satisfaction.    Jeanell Sparrow, FNP, have reviewed all documentation for this visit. The  documentation on 10/30/23 for the exam, diagnosis, procedures, and orders are all accurate and complete.   IF YOU HAVE BEEN REFERRED TO A SPECIALIST, IT MAY TAKE 1-2 WEEKS TO SCHEDULE/PROCESS THE REFERRAL. IF YOU HAVE NOT HEARD FROM US/SPECIALIST IN TWO WEEKS, PLEASE GIVE [place] A CALL AT [phone].

## 2023-10-30 ENCOUNTER — Ambulatory Visit: Payer: Medicare HMO | Admitting: Nurse Practitioner

## 2023-10-30 VITALS — BP 128/70 | HR 64 | Temp 97.9°F | Ht 63.0 in | Wt 150.8 lb

## 2023-10-30 DIAGNOSIS — I10 Essential (primary) hypertension: Secondary | ICD-10-CM | POA: Diagnosis not present

## 2023-10-30 DIAGNOSIS — Z2821 Immunization not carried out because of patient refusal: Secondary | ICD-10-CM | POA: Diagnosis not present

## 2023-10-30 DIAGNOSIS — Z23 Encounter for immunization: Secondary | ICD-10-CM

## 2023-10-30 DIAGNOSIS — E1142 Type 2 diabetes mellitus with diabetic polyneuropathy: Secondary | ICD-10-CM | POA: Diagnosis not present

## 2023-10-30 DIAGNOSIS — E782 Mixed hyperlipidemia: Secondary | ICD-10-CM

## 2023-10-30 NOTE — Patient Instructions (Addendum)
Stop the amlodipine completely, check your blood pressure once a day in the morning before you get up out of the bed if your blood pressure is greater than 130/80 more than 3 times in a week we need to restart the amlodipine, please send me a message to let me know what is going on. Continue your Hydrochlorthiazide 12.5 mg daily.  Return tomorrow at 1130 for fasting lab visiting.

## 2023-10-31 ENCOUNTER — Other Ambulatory Visit: Payer: Medicare HMO

## 2023-10-31 DIAGNOSIS — E782 Mixed hyperlipidemia: Secondary | ICD-10-CM | POA: Diagnosis not present

## 2023-10-31 DIAGNOSIS — I10 Essential (primary) hypertension: Secondary | ICD-10-CM | POA: Diagnosis not present

## 2023-10-31 DIAGNOSIS — E1142 Type 2 diabetes mellitus with diabetic polyneuropathy: Secondary | ICD-10-CM | POA: Diagnosis not present

## 2023-11-01 LAB — LIPID PANEL
Chol/HDL Ratio: 2.4 ratio (ref 0.0–4.4)
Cholesterol, Total: 200 mg/dL — ABNORMAL HIGH (ref 100–199)
HDL: 85 mg/dL (ref >39)
LDL Chol Calc (NIH): 108 mg/dL — ABNORMAL HIGH (ref 0–99)
Triglycerides: 37 mg/dL (ref 0–149)
VLDL Cholesterol Cal: 7 mg/dL (ref 5–40)

## 2023-11-01 LAB — HEMOGLOBIN A1C
Est. average glucose Bld gHb Est-mCnc: 134 mg/dL
Hgb A1c MFr Bld: 6.3 % — ABNORMAL HIGH (ref 4.8–5.6)

## 2023-11-01 LAB — BASIC METABOLIC PANEL
BUN/Creatinine Ratio: 23 (ref 12–28)
BUN: 16 mg/dL (ref 8–27)
CO2: 23 mmol/L (ref 20–29)
Calcium: 9.7 mg/dL (ref 8.7–10.3)
Chloride: 101 mmol/L (ref 96–106)
Creatinine, Ser: 0.7 mg/dL (ref 0.57–1.00)
Glucose: 96 mg/dL (ref 70–99)
Potassium: 3.9 mmol/L (ref 3.5–5.2)
Sodium: 141 mmol/L (ref 134–144)
eGFR: 90 mL/min/{1.73_m2} (ref >59)

## 2023-11-02 ENCOUNTER — Encounter: Payer: Self-pay | Admitting: Nurse Practitioner

## 2023-11-03 ENCOUNTER — Other Ambulatory Visit: Payer: Self-pay | Admitting: Nurse Practitioner

## 2023-11-03 DIAGNOSIS — E782 Mixed hyperlipidemia: Secondary | ICD-10-CM

## 2023-11-05 DIAGNOSIS — Z2821 Immunization not carried out because of patient refusal: Secondary | ICD-10-CM | POA: Insufficient documentation

## 2023-11-05 DIAGNOSIS — Z23 Encounter for immunization: Secondary | ICD-10-CM | POA: Insufficient documentation

## 2023-11-05 DIAGNOSIS — E782 Mixed hyperlipidemia: Secondary | ICD-10-CM | POA: Insufficient documentation

## 2023-11-05 NOTE — Assessment & Plan Note (Addendum)
Cholesterol levels have been elevated, she is not on a statin. Will consider referral for a calcium risk score scan pending her labs. Her ASCVD risk score is 37.8%

## 2023-11-05 NOTE — Assessment & Plan Note (Signed)
Blood pressure is well controlled, continue current medications.

## 2023-11-05 NOTE — Assessment & Plan Note (Signed)
Influenza vaccine administered Encouraged to take Tylenol as needed for fever or muscle aches.

## 2023-11-05 NOTE — Assessment & Plan Note (Signed)

## 2023-11-05 NOTE — Assessment & Plan Note (Signed)
HgbA1c is slightly improved, continue focusing on healthy diet and regular exercise.

## 2023-11-05 NOTE — Assessment & Plan Note (Signed)
Will give tetanus vaccine today while in office. Refer to order management. TDAP will be administered to adults 79-75 years old every 10 years.

## 2023-11-11 ENCOUNTER — Ambulatory Visit (HOSPITAL_COMMUNITY)
Admission: RE | Admit: 2023-11-11 | Discharge: 2023-11-11 | Disposition: A | Discharge status: Home / Self Care | Payer: Medicare HMO | Source: Ambulatory Visit | Attending: Nurse Practitioner | Admitting: Nurse Practitioner

## 2023-11-11 DIAGNOSIS — E782 Mixed hyperlipidemia: Secondary | ICD-10-CM | POA: Insufficient documentation

## 2024-01-14 NOTE — Progress Notes (Signed)
NEUROLOGY FOLLOW UP OFFICE NOTE  Yvonne Barron [id number]  Subjective:  Yvonne Barron is a 78 y.o. year old  right-handed female with a medical history of pre-diabetes, OA s/p left knee replacement, vit D deficiency, breast cancer (dx about 13 years ago, had surgery, no chemo or radiation, but tomoxifen), and HTN who we last saw on 01/24/23 for neuropathy.  To briefly review: 07/24/22: Patient first noticed numbness and tingling in her toes ~2021. At first it just felt like her feet were dry. Lotion did not help. At night her toes would tingle. It does not affect her as much when she is up and walking. Sometimes she feels twitching in her legs that she can move her legs around and it will help. Lately, she feels an occasional crawling sensation on her legs. She can feel tightness under her toes and like she is "walking on bubbles." She denies weakness of her legs. She walks often and does not use assistive devices. She denies falls.   She denies significant neck or back pain. She did have some pain in her back for which she went to ED (09/2021). She was diagnosed with left low back pain and sciatica. She was prescribed steroids and muscle relaxers which she only took one or two. She takes tumeric and bromelain, which helps. This pain does not radiate to her feet though.She denies bowel or bladder symptoms.    Her PCP recommended podiatry referral. Patient sees Dr Yvonne Barron in podiatry. Gabapentin was offered, but patient declined until her neurology appointment.   Of note, patient mentions that as a child, she would walk barefoot and her toes would pop a lot.   EtOH use: Occasional wine with dinner  Restrictive diet? No Family history of neuropathy/myopathy/NM disease? No     Prior medications that have been tried: Yvonne Barron supplement (did not help); also previously took B12, but has stopped. She has also tried nerve stimulation that helped, but she could not afford to continue  that.  01/24/23: B12, SPEP, and IFE were unremarkable. Patient began taking alpha lipoic acid, which she continues to take. She still has numbness and tingling in her feet, it may be a little less annoying than previous. She feels like her feet are knotting up or swollen at night. Lidocaine cream did not help much.   PCP ordered MRI lumbar spine due to sciatica symptoms mentioned at 11/26/22 visit. Insurance denied this. Patient describes a muscle tightness that grabs her. She has to shake her hip area to help it feel better. It does not radiate further into her leg. It used to happen on the right, but now is only occurring on the left. It is a very severe pain. She has not had this pain in at least a month though. She sees a Barron who is helping her with this.  Most recent Assessment and Plan (01/24/23): This is Yvonne Barron, a 78 y.o. female with distal symmetric polyneuropathy, mild clinically. Her only known risk factor is pre-diabetes, which we discussed today. She has some back pain, but symptoms sound more MSK in nature. There is no evidence of radiculopathy on exam.     Plan: -Discussed prescribing low dose gabapentin at night to help with tingling in feet. Patient will consider it and call if she wants it. -Discussed foot care  Since their last visit: Overall, patient is doing well. She thinks her neuropathy is maybe a little more painful than prior. She takes something from the health  food store with alpha lipoic acid in it. She has an Company secretary from [employer] that she feels helps.  She is still not interested in medications for neuropathy.  Her back pain and sciatica has greatly improved.  MEDICATIONS:  Outpatient Encounter Medications as of 01/23/2024  Medication Sig   Artificial Tear Ointment (DRY EYES OP) Place 1 drop into both eyes daily as needed (Dry eyes). Thera tears   calcium carbonate (OS-CAL - DOSED IN MG OF ELEMENTAL CALCIUM) 1250 (500 Ca)  MG tablet Take 1 tablet by mouth daily with breakfast.    cholecalciferol (VITAMIN D) 1000 units tablet Take 1,000 Units by mouth 2 (two) times daily.   glucose blood (ONETOUCH VERIO) test strip Check blood sugar daily   hydrochlorothiazide (HYDRODIURIL) 12.5 MG tablet Take 1 tablet (12.5 mg total) by mouth daily.   ibuprofen (ADVIL) 600 MG tablet Take 1 tablet (600 mg total) by mouth every 8 (eight) hours as needed.   Lancets (ONETOUCH DELICA PLUS LANCET33G) MISC USE TO TEST BLOOD SUGAR DAILY   Polyethyl Glycol-Propyl Glycol (SYSTANE) 0.4-0.3 % SOLN Place 1 drop into both eyes at bedtime.   tiZANidine (ZANAFLEX) 4 MG tablet Take 1 tablet (4 mg total) by mouth every 6 (six) hours as needed for muscle spasms.   UNABLE TO FIND Med Name: vitamins for eyes   vitamin B-12 (CYANOCOBALAMIN) 500 MCG tablet Take 500 mcg by mouth daily.   vitamin C (ASCORBIC ACID) 500 MG tablet Take 500 mg by mouth daily.   albuterol (VENTOLIN HFA) 108 (90 Base) MCG/ACT inhaler Inhale 1-2 puffs into the lungs every 6 (six) hours as needed for wheezing or shortness of breath. (Patient not taking: Reported on 01/23/2024)   amLODipine (NORVASC) 2.5 MG tablet Take 1 tablet (2.5 mg total) by mouth daily. (Patient not taking: Reported on 01/23/2024)   benzonatate (TESSALON) 100 MG capsule Take 1 capsule (100 mg total) by mouth every 8 (eight) hours as needed for cough. (Patient not taking: Reported on 01/23/2024)   fluticasone (FLONASE) 50 MCG/ACT nasal spray Place 1 spray into both nostrils daily. (Patient not taking: Reported on 01/23/2024)   valACYclovir (VALTREX) 1000 MG tablet Take 1,000 mg by mouth daily as needed (Cold sore). (Patient not taking: Reported on 01/23/2024)   No facility-administered encounter medications on file as of 01/23/2024.    PAST MEDICAL HISTORY: Past Medical History:  Diagnosis Date   Arthritis    knee Lt   History of breast cancer 2009--  INVASIVE DUCTAL IN SITU--  S/P MASTECTOMY   NO RECURRENCE    Hypertension    Pre-diabetes    Urethral caruncle     PAST SURGICAL HISTORY: Past Surgical History:  Procedure Laterality Date   CYSTOSCOPY WITH URETHRAL CARUNCLE  07/10/2012   Procedure: CYSTOSCOPY WITH URETHRAL CARUNCLE;  Surgeon: Garnett Farm, MD;  Location: [facility];  Service: Urology;  Laterality: N/A;    EXCISION OF CARUNCLE    LEFT TOTAL MASTECTOMY/ LEFT AXILLARY SENTINEL LYMPH NODE BX  03-01-2008   INVASIVE  DUCTAL CARCINOMA IN SITU   LEFT WRIST SURG  2008   REPAIR NERVE   MASTECTOMY Left 2009   TOE SURGERY Right 2017   Great toe   TOTAL KNEE ARTHROPLASTY Left 07/28/2020   Procedure: TOTAL KNEE ARTHROPLASTY;  Surgeon: Beverely Low, MD;  Location: [facility];  Service: Orthopedics;  Laterality: Left;   VAGINAL HYSTERECTOMY  1998   W/ BILATERAL SALPINGOOPHECTOMY    ALLERGIES: No Known Allergies  FAMILY  HISTORY: Family History  Problem Relation Age of Onset   Diabetes Mother    Diabetes Father    Kidney disease Father    Diabetes Brother     SOCIAL HISTORY: Social History   Tobacco Use   Smoking status: Former    Current packs/day: 0.00    Average packs/day: 2.0 packs/day for 20.0 years (40.0 ttl pk-yrs)    Types: Cigarettes    Start date: 01/19/1967    Quit date: 01/19/1987    Years since quitting: 37.0   Smokeless tobacco: Never  Vaping Use   Vaping status: Never Used  Substance Use Topics   Alcohol use: No   Drug use: Never   Social History   Social History Narrative   Right handed   Lives with son one story home    Caffeine none   Retired but works part time      Objective:  Vital Signs:  BP 118/68   Pulse 68   Ht 5\' 3"  (1.6 m)   Wt 152 lb (68.9 kg)   SpO2 98%   BMI 26.93 kg/m   General: No acute distress.  Patient appears well-groomed.   Head:  Normocephalic/atraumatic Neck: supple, no paraspinal tenderness, full range of motion Heart:  Regular rate and rhythm Lungs:  Clear to auscultation bilaterally Back: No  paraspinal tenderness Neurological Exam: alert and oriented.  Speech fluent and not dysarthric, language intact.  CN II-XII intact. Bulk and tone normal, muscle strength 5/5 throughout.  Sensation to pinprick intact.  Deep tendon reflexes 2+ throughout, except trace at ankles.  Finger to nose testing intact.  Gait normal.   Labs and Imaging review: New results: 10/31/23: HbA1c: 6.3 Lipid panel: tChol 200, LDL 108, TG 37 BMP unremarkable  Previously reviewed results: 07/2022: B12: 712 IFE and SPEP: no M protein HbA1c: 6.1   11/26/22: HbA1c: 6.2   Lumbar spine xray (10/13/22): FINDINGS: Five non rib-bearing lumbar type vertebra are identified in normal alignment.   There is no evidence of acute fracture or subluxation.   Mild degenerative disc disease at L4-5 and L5-S1 noted.   No other significant abnormalities noted.   IMPRESSION: 1. No acute abnormality. 2. Mild degenerative disc disease at L4-5 and L5-S1.  Assessment/Plan:  This is Yvonne Barron, a 78 y.o. female withdistal symmetric polyneuropathy, mild clinically. Her only known risk factor is pre-diabetes. Overall, her examination and symptoms are stable.   Plan: -Discussed neuropathic pain medications, patient deferred -Alpha lipoic acid 600 mg once or twice daily -Lidocaine cream PRN  Return to clinic in 1 year  Total time spent reviewing records, interview, history/exam, documentation, and coordination of care on day of encounter:  30 min  Jacquelyne Balint, MD

## 2024-01-23 ENCOUNTER — Ambulatory Visit: Payer: Medicare HMO | Admitting: Neurology

## 2024-01-23 ENCOUNTER — Encounter: Payer: Self-pay | Admitting: Neurology

## 2024-01-23 VITALS — BP 118/68 | HR 68 | Ht 63.0 in | Wt 152.0 lb

## 2024-01-23 DIAGNOSIS — R7303 Prediabetes: Secondary | ICD-10-CM | POA: Diagnosis not present

## 2024-01-23 DIAGNOSIS — E1142 Type 2 diabetes mellitus with diabetic polyneuropathy: Secondary | ICD-10-CM

## 2024-01-23 DIAGNOSIS — R209 Unspecified disturbances of skin sensation: Secondary | ICD-10-CM | POA: Diagnosis not present

## 2024-01-23 NOTE — Patient Instructions (Signed)
Alpha lipoic acid 600mg  daily has some research data suggesting it helps with nerve health. No major side effects other than <1% of people report upset stomach. This can be taken twice per day (1200mg  daily) if no relief obtained. You can buy this over the counter or online.   You can also try Lidocaine cream as needed. Apply wear you have pain, tingling, or burning. Wear gloves to prevent your hands being numb. This can be bought over the counter at any drug store or online.  Recommend the following measures that may provide some symptomatic benefit for muscle twitching and/or cramps: - Adequate oral clear fluid intake to maintain optimal hydration (about 2.5 liters, or around 8-10 glasses per day) Avoidance of caffeine Trial of DIET tonic water: About 1 glass, up to 6 times daily Magnesium oxide up to 400 mg by mouth twice daily, as needed (over the counter) Gentle muscle stretching routine, especially before bedtime

## 2024-02-11 ENCOUNTER — Encounter: Payer: Self-pay | Admitting: Nurse Practitioner

## 2024-02-25 DIAGNOSIS — U071 COVID-19: Secondary | ICD-10-CM | POA: Diagnosis not present

## 2024-03-01 ENCOUNTER — Ambulatory Visit: Payer: Medicare HMO | Admitting: Nurse Practitioner

## 2024-03-01 ENCOUNTER — Encounter: Payer: Self-pay | Admitting: Nurse Practitioner

## 2024-03-01 VITALS — BP 110/80 | HR 61 | Temp 98.2°F | Ht 63.0 in | Wt 153.0 lb

## 2024-03-01 DIAGNOSIS — E1142 Type 2 diabetes mellitus with diabetic polyneuropathy: Secondary | ICD-10-CM | POA: Diagnosis not present

## 2024-03-01 DIAGNOSIS — I10 Essential (primary) hypertension: Secondary | ICD-10-CM | POA: Diagnosis not present

## 2024-03-01 DIAGNOSIS — R2231 Localized swelling, mass and lump, right upper limb: Secondary | ICD-10-CM

## 2024-03-01 DIAGNOSIS — Z79899 Other long term (current) drug therapy: Secondary | ICD-10-CM

## 2024-03-01 DIAGNOSIS — M7660 Achilles tendinitis, unspecified leg: Secondary | ICD-10-CM | POA: Diagnosis not present

## 2024-03-01 DIAGNOSIS — E782 Mixed hyperlipidemia: Secondary | ICD-10-CM | POA: Diagnosis not present

## 2024-03-01 MED ORDER — HYDROCHLOROTHIAZIDE 12.5 MG PO TABS: 12.5000 mg | ORAL_TABLET | Freq: Every day | ORAL | 1 refills | Status: DC

## 2024-03-01 NOTE — Progress Notes (Addendum)
 I,Jameka J Llittleton, CMA,acting as a Neurosurgeon for [employer], FNP.,have documented all relevant documentation on the behalf of [name], FNP,as directed by  [name], FNP while in the presence of [name], FNP.  Subjective:  Patient ID: Yvonne Barron , female    DOB: 1946/03/11 , 78 y.o.   MRN: [id number]  Chief Complaint  Patient presents with   Diabetes    HPI  Patient presents today for a bp and dm follow up, Patient reports compliance with medication. Patient denies any chest pain, SOB, or headaches. Patient wants you to look at her heel and under her left armpit.  Diabetes She presents for her follow-up diabetic visit. Diabetes type: prediabetes. Her disease course has been stable. There are no hypoglycemic associated symptoms. Pertinent negatives for hypoglycemia include no dizziness. There are no diabetic associated symptoms. Pertinent negatives for diabetes include no chest pain, no fatigue, no polydipsia, no polyphagia, no polyuria and no weakness. There are no hypoglycemic complications. Symptoms are stable. Diabetic complications include peripheral neuropathy. Risk factors for coronary artery disease include post-menopausal. Current diabetic treatment includes oral agent (monotherapy). She is compliant with treatment all of the time. Her weight is stable. She is following a generally healthy diet. When asked about meal planning, she reported none. She has not had a previous visit with a dietitian. Exercise: two days a week - 45 minutes a day. There is no change in her home blood glucose trend. ( Blood sugar has been ranging 99-117, checks once a day) An ACE inhibitor/angiotensin II receptor blocker is being taken. She does not see a podiatrist.Eye exam is not current (Appt on July 19th).  Hypertension This is a chronic problem. The current episode started more than 1 year ago. The problem has been gradually improving since onset. The problem is controlled. Pertinent  negatives include no anxiety, chest pain or palpitations. There are no associated agents to hypertension. Risk factors for coronary artery disease include diabetes mellitus. Past treatments include calcium channel blockers and diuretics. There are no compliance problems.      Past Medical History:  Diagnosis Date   Arthritis    knee Lt   History of breast cancer 2009--  INVASIVE DUCTAL IN SITU--  S/P MASTECTOMY   NO RECURRENCE   Hypertension    Pre-diabetes    Urethral caruncle      Family History  Problem Relation Age of Onset   Diabetes Mother    Diabetes Father    Kidney disease Father    Diabetes Brother      Current Outpatient Medications:    cholecalciferol (VITAMIN D) 1000 units tablet, Take 1,000 Units by mouth 2 (two) times daily., Disp: , Rfl:    CITRUS BERGAMOT PO, Take 1 capsule by mouth daily at 2 am., Disp: , Rfl:    glucose blood (ONETOUCH VERIO) test strip, Check blood sugar daily, Disp: 100 each, Rfl: 3   Lancets (ONETOUCH DELICA PLUS LANCET33G) MISC, USE TO TEST BLOOD SUGAR DAILY, Disp: 100 each, Rfl: 3   UNABLE TO FIND, Med Name: vitamins for eyes, Disp: , Rfl:    UNABLE TO FIND, Med Name: healthy feet and nerves, Disp: , Rfl:    vitamin C (ASCORBIC ACID) 500 MG tablet, Take 500 mg by mouth daily., Disp: , Rfl:    hydrochlorothiazide (HYDRODIURIL) 12.5 MG tablet, Take 1 tablet (12.5 mg total) by mouth daily., Disp: 90 tablet, Rfl: 1   vitamin B-12 (CYANOCOBALAMIN) 500 MCG tablet, Take 500 mcg by  mouth daily. (Patient not taking: Reported on 03/01/2024), Disp: , Rfl:    No Known Allergies   Review of Systems  Constitutional: Negative.  Negative for fatigue.  Eyes: Negative.   Respiratory: Negative.    Cardiovascular:  Negative for chest pain, palpitations and leg swelling.  Endocrine: Negative for polydipsia, polyphagia and polyuria.  Musculoskeletal: Negative.   Skin: Negative.   Neurological:  Negative for dizziness and weakness.  Hematological:  Negative.   Psychiatric/Behavioral: Negative.       Today's Vitals   03/01/24 1413  BP: 110/80  Pulse: 61  Temp: 98.2 F (36.8 C)  TempSrc: Oral  Weight: 153 lb (69.4 kg)  Height: 5\' 3"  (1.6 m)  PainSc: 0-No pain   Body mass index is 27.1 kg/m.  Wt Readings from Last 3 Encounters:  03/01/24 153 lb (69.4 kg)  01/23/24 152 lb (68.9 kg)  10/30/23 150 lb 12.8 oz (68.4 kg)    Objective:  Physical Exam Vitals reviewed.  Constitutional:      General: She is not in acute distress.    Appearance: Normal appearance.  Cardiovascular:     Rate and Rhythm: Normal rate and regular rhythm.     Pulses: Normal pulses.     Heart sounds: Normal heart sounds. No murmur heard. Pulmonary:     Effort: Pulmonary effort is normal. No respiratory distress.     Breath sounds: Normal breath sounds. No wheezing.  Musculoskeletal:        General: No tenderness.     Right ankle:     Right Achilles Tendon: Defect (raised semi soft lump) present. No tenderness.     Left ankle:     Left Achilles Tendon: No tenderness.  Skin:    General: Skin is warm and dry.     Capillary Refill: Capillary refill takes less than 2 seconds.     Comments: Left axilla has a firm superficial lump with slightly dark center.  Neurological:     General: No focal deficit present.     Mental Status: She is alert and oriented to person, place, and time.     Cranial Nerves: No cranial nerve deficit.     Motor: No weakness.  Psychiatric:        Mood and Affect: Mood normal.        Behavior: Behavior normal.        Thought Content: Thought content normal.        Judgment: Judgment normal.         Assessment And Plan:  Essential hypertension Assessment & Plan: Blood pressure is well controlled, continue current medications  Orders: -     Microalbumin / creatinine urine ratio -     hydroCHLOROthiazide; Take 1 tablet (12.5 mg total) by mouth daily.  Dispense: 90 tablet; Refill: 1  Type 2 diabetes mellitus with  diabetic polyneuropathy, without long-term current use of insulin (HCC) Assessment & Plan: HgbA1c is slightly improved, continue focusing on healthy diet and regular exercise.  Orders: -     CMP14+EGFR -     Hemoglobin A1c -     Microalbumin / creatinine urine ratio  Mixed hyperlipidemia Assessment & Plan: Cholesterol levels have been elevated, her calcium risk score scan shows 2.29%. recommend statin however the patient is not interested at this time.   Orders: -     Lipid panel  Other long term (current) drug therapy -     CBC  Pain in Achilles tendon Assessment & Plan: Firm mass to  her left achilles tendon, she is advised to go for an ankle xray.   Orders: -     DG Ankle Complete Left; Future  Mass of right axilla    No follow-ups on file.  Patient was given opportunity to ask questions. Patient verbalized understanding of the plan and was able to repeat key elements of the plan. All questions were answered to their satisfaction.    Inge Mangle, FNP, have reviewed all documentation for this visit. The documentation on 03/01/24 for the exam, diagnosis, procedures, and orders are all accurate and complete.   IF YOU HAVE BEEN REFERRED TO A SPECIALIST, IT MAY TAKE 1-2 WEEKS TO SCHEDULE/PROCESS THE REFERRAL. IF YOU HAVE NOT HEARD FROM US /SPECIALIST IN TWO WEEKS, PLEASE GIVE US  A CALL AT [phone].

## 2024-03-02 ENCOUNTER — Encounter: Payer: Self-pay | Admitting: Nurse Practitioner

## 2024-03-02 LAB — HEMOGLOBIN A1C
Est. average glucose Bld gHb Est-mCnc: 131 mg/dL
Hgb A1c MFr Bld: 6.2 % — ABNORMAL HIGH (ref 4.8–5.6)

## 2024-03-02 LAB — CBC
Hematocrit: 37.7 % (ref 34.0–46.6)
Hemoglobin: 12 g/dL (ref 11.1–15.9)
MCH: 26.8 pg (ref 26.6–33.0)
MCHC: 31.8 g/dL (ref 31.5–35.7)
MCV: 84 fL (ref 79–97)
Platelets: 392 10*3/uL (ref 150–450)
RBC: 4.47 x10E6/uL (ref 3.77–5.28)
RDW: 13.1 % (ref 11.7–15.4)
WBC: 7.4 10*3/uL (ref 3.4–10.8)

## 2024-03-02 LAB — LIPID PANEL
Chol/HDL Ratio: 2.9 ratio (ref 0.0–4.4)
Cholesterol, Total: 208 mg/dL — ABNORMAL HIGH (ref 100–199)
HDL: 71 mg/dL (ref >39)
LDL Chol Calc (NIH): 121 mg/dL — ABNORMAL HIGH (ref 0–99)
Triglycerides: 90 mg/dL (ref 0–149)
VLDL Cholesterol Cal: 16 mg/dL (ref 5–40)

## 2024-03-02 LAB — CMP14+EGFR
ALT: 12 IU/L (ref 0–32)
AST: 17 IU/L (ref 0–40)
Albumin: 4.3 g/dL (ref 3.8–4.8)
Alkaline Phosphatase: 79 IU/L (ref 44–121)
BUN/Creatinine Ratio: 22 (ref 12–28)
BUN: 16 mg/dL (ref 8–27)
Bilirubin Total: 0.3 mg/dL (ref 0.0–1.2)
CO2: 26 mmol/L (ref 20–29)
Calcium: 9.6 mg/dL (ref 8.7–10.3)
Chloride: 103 mmol/L (ref 96–106)
Creatinine, Ser: 0.74 mg/dL (ref 0.57–1.00)
Globulin, Total: 2.8 g/dL (ref 1.5–4.5)
Glucose: 81 mg/dL (ref 70–99)
Potassium: 4.1 mmol/L (ref 3.5–5.2)
Sodium: 142 mmol/L (ref 134–144)
Total Protein: 7.1 g/dL (ref 6.0–8.5)
eGFR: 84 mL/min/{1.73_m2} (ref >59)

## 2024-03-03 LAB — MICROALBUMIN / CREATININE URINE RATIO
Creatinine, Urine: 117.7 mg/dL
Microalb/Creat Ratio: <3 mg/g{creat} (ref 0–29)
Microalbumin, Urine: <3 ug/mL

## 2024-03-09 DIAGNOSIS — R2231 Localized swelling, mass and lump, right upper limb: Secondary | ICD-10-CM | POA: Insufficient documentation

## 2024-03-09 DIAGNOSIS — M7660 Achilles tendinitis, unspecified leg: Secondary | ICD-10-CM | POA: Insufficient documentation

## 2024-03-09 NOTE — Assessment & Plan Note (Signed)
 Firm mass to her left achilles tendon, she is advised to go for an ankle xray.

## 2024-03-09 NOTE — Assessment & Plan Note (Signed)
 Blood pressure is well controlled, continue current medications.

## 2024-03-09 NOTE — Assessment & Plan Note (Signed)
 Cholesterol levels have been elevated, her calcium risk score scan shows 2.29%. recommend statin however the patient is not interested at this time.

## 2024-03-09 NOTE — Assessment & Plan Note (Signed)
 HgbA1c is slightly improved, continue focusing on healthy diet and regular exercise.

## 2024-03-22 NOTE — Progress Notes (Signed)
 updated

## 2024-04-28 ENCOUNTER — Ambulatory Visit (INDEPENDENT_AMBULATORY_CARE_PROVIDER_SITE_OTHER): Payer: Medicare HMO

## 2024-04-28 DIAGNOSIS — Z0000 Encounter for general adult medical examination without abnormal findings: Secondary | ICD-10-CM | POA: Diagnosis not present

## 2024-04-28 NOTE — Patient Instructions (Signed)
 Ms. Yvonne Barron , Thank you for taking time out of your busy schedule to complete your Annual Wellness Visit with me. I enjoyed our conversation and look forward to speaking with you again next year. I, as well as your care team,  appreciate your ongoing commitment to your health goals. Please review the following plan we discussed and let me know if I can assist you in the future. Your Game plan/ To Do List    Referrals: If you haven't heard from the office you've been referred to, please reach out to them at the phone provided.  N/a Follow up Visits: Next Medicare AWV with our clinical staff: 05/25/2025 at 3:40   Have you seen your provider in the last 6 months (3 months if uncontrolled diabetes)? Yes Next Office Visit with your provider: 05/04/2024 at 2:00  Clinician Recommendations:  Aim for 30 minutes of exercise or brisk walking, 6-8 glasses of water , and 5 servings of fruits and vegetables each day.       This is a list of the screening recommended for you and due dates:  Health Maintenance  Topic Date Due   Complete foot exam   11/27/2023   COVID-19 Vaccine (4 - 2024-25 season) 05/14/2024*   Flu Shot  07/09/2024   Eye exam for diabetics  08/19/2024   Hemoglobin A1C  09/01/2024   Yearly kidney function blood test for diabetes  03/01/2025   Yearly kidney health urinalysis for diabetes  03/01/2025   Medicare Annual Wellness Visit  04/28/2025   DTaP/Tdap/Td vaccine (3 - Td or Tdap) 10/29/2033   Pneumonia Vaccine  Completed   DEXA scan (bone density measurement)  Completed   Hepatitis C Screening  Completed   Zoster (Shingles) Vaccine  Completed   HPV Vaccine  Aged Out   Meningitis B Vaccine  Aged Out   Colon Cancer Screening  Discontinued  *Topic was postponed. The date shown is not the original due date.    Advanced directives: (Copy Requested) Please bring a copy of your health care power of attorney and living will to the office to be added to your chart at your convenience. You  can mail to [facility] [address] [place], [place] [address] or email to [email or web address] Advance Care Planning is important because it:  [x]  Makes sure you receive the medical care that is consistent with your values, goals, and preferences  [x]  It provides guidance to your family and loved ones and reduces their decisional burden about whether or not they are making the right decisions based on your wishes.  Follow the link provided in your after visit summary or read over the paperwork we have mailed to you to help you started getting your Advance Directives in place. If you need assistance in completing these, please reach out to us  so that we can help you!  See attachments for Preventive Care and Fall Prevention Tips.

## 2024-04-28 NOTE — Progress Notes (Signed)
 Subjective:   Yvonne Barron is a 78 y.o. who presents for a Medicare Wellness preventive visit.  As a reminder, Annual Wellness Visits don't include a physical exam, and some assessments may be limited, especially if this visit is performed virtually. We may recommend an in-person follow-up visit with your provider if needed.  Visit Complete: Virtual I connected with  Yvonne Barron on 04/28/24 by a audio enabled telemedicine application and verified that I am speaking with the correct person using two identifiers.  Patient Location: Home  Provider Location: Office/Clinic  I discussed the limitations of evaluation and management by telemedicine. The patient expressed understanding and agreed to proceed.  Vital Signs: Because this visit was a virtual/telehealth visit, some criteria may be missing or patient reported. Any vitals not documented were not able to be obtained and vitals that have been documented are patient reported.  VideoError- Librarian, academic were attempted between this provider and patient, however failed, due to patient having technical difficulties OR patient did not have access to video capability.  We continued and completed visit with audio only.   Persons Participating in Visit: Patient.  AWV Questionnaire: Yes: Patient Medicare AWV questionnaire was completed by the patient on 04/28/2024; I have confirmed that all information answered by patient is correct and no changes since this date.  Cardiac Risk Factors include: advanced age (>41men, >19 women);diabetes mellitus;hypertension     Objective:     Today's Vitals   There is no height or weight on file to calculate BMI.     04/28/2024    2:56 PM 04/24/2023    2:08 PM 01/24/2023    1:56 PM 07/24/2022    2:32 PM 04/11/2022    2:37 PM 03/28/2021    8:34 AM 07/28/2020   11:29 AM  Advanced Directives  Does Patient Have a Medical Advance Directive? Yes Yes Yes Yes Yes  Yes Yes  Type of Estate agent of [place];Living will Healthcare Power of [place];Living will Healthcare Power of [place];Living will Healthcare Power of [place];Living will Healthcare Power of [place];Living will Healthcare Power of [place];Living will Healthcare Power of [place];Living will  Does patient want to make changes to medical advance directive?       No - Patient declined  Copy of Healthcare Power of Attorney in Chart? No - copy requested No - copy requested   No - copy requested No - copy requested No - copy requested    Current Medications (verified) Outpatient Encounter Medications as of 04/28/2024  Medication Sig   cholecalciferol  (VITAMIN D ) 1000 units tablet Take 1,000 Units by mouth 2 (two) times daily.   CITRUS BERGAMOT PO Take 1 capsule by mouth daily at 2 am.   glucose blood (ONETOUCH VERIO) test strip Check blood sugar daily   hydrochlorothiazide  (HYDRODIURIL ) 12.5 MG tablet Take 1 tablet (12.5 mg total) by mouth daily.   Lancets (ONETOUCH DELICA PLUS LANCET33G) MISC USE TO TEST BLOOD SUGAR DAILY   UNABLE TO FIND Med Name: vitamins for eyes   UNABLE TO FIND Med Name: healthy feet and nerves   vitamin B-12 (CYANOCOBALAMIN ) 500 MCG tablet Take 500 mcg by mouth daily.   vitamin C (ASCORBIC ACID ) 500 MG tablet Take 500 mg by mouth daily.   No facility-administered encounter medications on file as of 04/28/2024.    Allergies (verified) Patient has no known allergies.   History: Past Medical History:  Diagnosis Date   Arthritis    knee Lt  Cancer [facility])    2013   History of breast cancer 2009--  INVASIVE DUCTAL IN SITU--  S/P MASTECTOMY   NO RECURRENCE   Hypertension    Pre-diabetes    Urethral caruncle    Past Surgical History:  Procedure Laterality Date   BREAST SURGERY  2013   CYSTOSCOPY WITH URETHRAL CARUNCLE  07/10/2012   Procedure: CYSTOSCOPY WITH URETHRAL CARUNCLE;  Surgeon: Mark C Ottelin, MD;  Location: [facility];  Service: Urology;  Laterality: N/A;    EXCISION OF CARUNCLE    JOINT REPLACEMENT  L/knee   LEFT TOTAL MASTECTOMY/ LEFT AXILLARY SENTINEL LYMPH NODE BX  03/01/2008   INVASIVE  DUCTAL CARCINOMA IN SITU   LEFT WRIST SURG  2008   REPAIR NERVE   MASTECTOMY Left 2009   TOE SURGERY Right 2017   Great toe   TOTAL KNEE ARTHROPLASTY Left 07/28/2020   Procedure: TOTAL KNEE ARTHROPLASTY;  Surgeon: Winston Hawking, MD;  Location: [facility];  Service: Orthopedics;  Laterality: Left;   VAGINAL HYSTERECTOMY  1998   W/ BILATERAL SALPINGOOPHECTOMY   Family History  Problem Relation Age of Onset   Diabetes Mother    Diabetes Father    Kidney disease Father    Diabetes Brother    Social History   Socioeconomic History   Marital status: Divorced    Spouse name: Not on file   Number of children: Not on file   Years of education: Not on file   Highest education level: Associate degree: occupational, Scientist, product/process development, or vocational program  Occupational History   Occupation: semi- retired  Tobacco Use   Smoking status: Former    Current packs/day: 0.00    Average packs/day: 2.0 packs/day for 20.0 years (40.0 ttl pk-yrs)    Types: Cigarettes    Start date: 01/19/1967    Quit date: 01/19/1987    Years since quitting: 37.2   Smokeless tobacco: Never  Vaping Use   Vaping status: Never Used  Substance and Sexual Activity   Alcohol  use: No   Drug use: Never   Sexual activity: Not Currently  Other Topics Concern   Not on file  Social History Narrative   Right handed   Lives with son one story home    Caffeine none   Retired but works part time   Social Drivers of Corporate investment banker Strain: Low Risk  (04/28/2024)   Overall Financial Resource Strain (CARDIA)    Difficulty of Paying Living Expenses: Not very hard  Food Insecurity: No Food Insecurity (04/28/2024)   Hunger Vital Sign    Worried About Running Out of Food in the Last Year: Never true    Ran Out of Food in the Last Year:  Never true  Transportation Needs: No Transportation Needs (04/28/2024)   PRAPARE - Administrator, Civil Service (Medical): No    Lack of Transportation (Non-Medical): No  Physical Activity: Sufficiently Active (04/28/2024)   Exercise Vital Sign    Days of Exercise per Week: 4 days    Minutes of Exercise per Session: 40 min  Stress: No Stress Concern Present (04/28/2024)   [employer] of Occupational Health - Occupational Stress Questionnaire    Feeling of Stress : Only a little  Social Connections: Moderately Integrated (04/28/2024)   Social Connection and Isolation Panel [NHANES]    Frequency of Communication with Friends and Family: More than three times a week    Frequency of Social Gatherings with Friends and Family: Twice  a week    Attends Religious Services: More than 4 times per year    Active Member of Clubs or Organizations: Yes    Attends Banker Meetings: 1 to 4 times per year    Marital Status: Widowed    Tobacco Counseling Counseling given: Not Answered    Clinical Intake:  Pre-visit preparation completed: Yes  Pain : No/denies pain     Nutritional Risks: None Diabetes: Yes CBG done?: No Did pt. bring in CBG monitor from home?: No  Lab Results  Component Value Date   HGBA1C 6.2 (H) 03/01/2024   HGBA1C 6.3 (H) 10/31/2023   HGBA1C 6.2 (H) 04/30/2023     How often do you need to have someone help you when you read instructions, pamphlets, or other written materials from your doctor or pharmacy?: 1 - Never  Interpreter Needed?: No  Information entered by :: NAllen LPN   Activities of Daily Living     04/28/2024    2:11 PM  In your present state of health, do you have any difficulty performing the following activities:  Hearing? 0  Vision? 1  Comment driving at night  Difficulty concentrating or making decisions? 0  Walking or climbing stairs? 0  Dressing or bathing? 0  Doing errands, shopping? 0  Preparing Food and  eating ? N  Using the Toilet? N  In the past six months, have you accidently leaked urine? N  Do you have problems with loss of bowel control? N  Managing your Medications? N  Managing your Finances? N  Housekeeping or managing your Housekeeping? N    Patient Care Team: [name], FNP as PCP - General (General Practice) [facility]  Indicate any recent Medical Services you may have received from other than [facility] providers in the past year (date may be approximate).     Assessment:    This is a routine wellness examination for Zimmerman.  Hearing/Vision screen Hearing Screening - Comments:: Denies hearing issues Vision Screening - Comments:: Regular eye exams, [facility]   Goals Addressed             This Visit's Progress    Patient Stated       04/28/2024, wants to lose 5 pounds       Depression Screen     04/28/2024    2:57 PM 04/29/2023    3:21 PM 04/24/2023    2:09 PM 01/14/2023    2:22 PM 11/26/2022   12:22 PM 07/31/2022    3:31 PM 04/11/2022    2:38 PM  PHQ 2/9 Scores  PHQ - 2 Score 0 0 0 0 0 0 0  PHQ- 9 Score 0 2         Fall Risk     04/28/2024    2:11 PM 04/29/2023    3:21 PM 04/24/2023    2:09 PM 01/24/2023    1:56 PM 01/14/2023    2:22 PM  Fall Risk   Falls in the past year? 0 0 0 0 0  Number falls in past yr: 0 0 0 0   Injury with Fall? 0 0 0 0   Risk for fall due to : Medication side effect No Fall Risks Medication side effect    Follow up Falls evaluation completed;Falls prevention discussed Falls evaluation completed Falls prevention discussed;Education provided;Falls evaluation completed Falls evaluation completed     MEDICARE RISK AT HOME:  Medicare Risk at Home Any stairs in or around the  home?: (Patient-Rptd) No If so, are there any without handrails?: (Patient-Rptd) No Home free of loose throw rugs in walkways, pet beds, electrical cords, etc?: (Patient-Rptd) Yes Adequate lighting in your home to reduce risk of  falls?: (Patient-Rptd) Yes Life alert?: (Patient-Rptd) No Use of a cane, walker or w/c?: (Patient-Rptd) No Grab bars in the bathroom?: (Patient-Rptd) Yes Shower chair or bench in shower?: (Patient-Rptd) No Elevated toilet seat or a handicapped toilet?: (Patient-Rptd) No  TIMED UP AND GO:  Was the test performed?  No  Cognitive Function: 6CIT completed        04/28/2024    2:57 PM 04/24/2023    2:13 PM 04/11/2022    2:40 PM 03/28/2021    8:35 AM 03/16/2020    9:52 AM  6CIT Screen  What Year? 0 points 0 points 0 points 0 points 0 points  What month? 0 points 0 points 0 points 0 points 0 points  What time? 0 points 0 points 0 points 0 points 0 points  Count back from 20 0 points 0 points 0 points 2 points 0 points  Months in reverse 2 points 0 points 2 points 2 points 0 points  Repeat phrase 4 points 2 points 0 points 0 points 0 points  Total Score 6 points 2 points 2 points 4 points 0 points    Immunizations Immunization History  Administered Date(s) Administered   Fluad Quad(high Dose 65+) 09/19/2020, 09/13/2021, 11/26/2022   Fluad Trivalent(High Dose 65+) 10/30/2023   Influenza,inj,quad, With Preservative 09/09/2019   Influenza-Unspecified 09/23/2018   PFIZER(Purple Top)SARS-COV-2 Vaccination 01/22/2020, 02/16/2020, 10/26/2020   PNEUMOCOCCAL CONJUGATE-20 12/20/2021   Pneumococcal Polysaccharide-23 03/09/2018   Tdap 09/27/2012, 10/30/2023   Zoster Recombinant(Shingrix ) 04/17/2022, 07/20/2022    Screening Tests Health Maintenance  Topic Date Due   FOOT EXAM  11/27/2023   COVID-19 Vaccine (4 - 2024-25 season) 05/14/2024 (Originally 08/10/2023)   INFLUENZA VACCINE  07/09/2024   OPHTHALMOLOGY EXAM  08/19/2024   HEMOGLOBIN A1C  09/01/2024   Diabetic kidney evaluation - eGFR measurement  03/01/2025   Diabetic kidney evaluation - Urine ACR  03/01/2025   Medicare Annual Wellness (AWV)  04/28/2025   DTaP/Tdap/Td (3 - Td or Tdap) 10/29/2033   Pneumonia Vaccine 109+ Years old   Completed   DEXA SCAN  Completed   Hepatitis C Screening  Completed   Zoster Vaccines- Shingrix   Completed   HPV VACCINES  Aged Out   Meningococcal B Vaccine  Aged Out   Colonoscopy  Discontinued    Health Maintenance  Health Maintenance Due  Topic Date Due   FOOT EXAM  11/27/2023   Health Maintenance Items Addressed: Declines covid vaccine. Has appointment 5/27 can address foot exam.  Additional Screening:  Vision Screening: Recommended annual ophthalmology exams for early detection of glaucoma and other disorders of the eye.  Dental Screening: Recommended annual dental exams for proper oral hygiene  Community Resource Referral / Chronic Care Management: CRR required this visit?  No   CCM required this visit?  No   Plan:    I have personally reviewed and noted the following in the patient's chart:   Medical and social history Use of alcohol , tobacco or illicit drugs  Current medications and supplements including opioid prescriptions. Patient is not currently taking opioid prescriptions. Functional ability and status Nutritional status Physical activity Advanced directives List of other physicians Hospitalizations, surgeries, and ER visits in previous 12 months Vitals Screenings to include cognitive, depression, and falls Referrals and appointments  In addition, I have  reviewed and discussed with patient certain preventive protocols, quality metrics, and best practice recommendations. A written personalized care plan for preventive services as well as general preventive health recommendations were provided to patient.   Areatha Beecham, LPN   1/61/0960   After Visit Summary: (MyChart) Due to this being a telephonic visit, the after visit summary with patients personalized plan was offered to patient via MyChart   Notes: Nothing significant to report at this time.

## 2024-05-04 ENCOUNTER — Encounter: Payer: Self-pay | Admitting: Nurse Practitioner

## 2024-05-04 ENCOUNTER — Ambulatory Visit: Payer: Self-pay | Admitting: Nurse Practitioner

## 2024-05-04 VITALS — BP 110/70 | HR 83 | Temp 98.0°F | Ht 63.0 in | Wt 156.0 lb

## 2024-05-04 DIAGNOSIS — E663 Overweight: Secondary | ICD-10-CM

## 2024-05-04 DIAGNOSIS — Z0000 Encounter for general adult medical examination without abnormal findings: Secondary | ICD-10-CM

## 2024-05-04 DIAGNOSIS — E782 Mixed hyperlipidemia: Secondary | ICD-10-CM

## 2024-05-04 DIAGNOSIS — Z6827 Body mass index (BMI) 27.0-27.9, adult: Secondary | ICD-10-CM

## 2024-05-04 DIAGNOSIS — I10 Essential (primary) hypertension: Secondary | ICD-10-CM | POA: Diagnosis not present

## 2024-05-04 DIAGNOSIS — E1142 Type 2 diabetes mellitus with diabetic polyneuropathy: Secondary | ICD-10-CM | POA: Diagnosis not present

## 2024-05-04 DIAGNOSIS — Z79899 Other long term (current) drug therapy: Secondary | ICD-10-CM

## 2024-05-04 LAB — POCT URINALYSIS DIP (CLINITEK)
Bilirubin, UA: NEGATIVE
Glucose, UA: NEGATIVE mg/dL
Ketones, POC UA: NEGATIVE mg/dL
Leukocytes, UA: NEGATIVE
Nitrite, UA: NEGATIVE
POC PROTEIN,UA: NEGATIVE
Spec Grav, UA: 1.01 (ref 1.010–1.025)
Urobilinogen, UA: 0.2 U/dL
pH, UA: 7 (ref 5.0–8.0)

## 2024-05-04 MED ORDER — ONETOUCH VERIO VI STRP: ORAL_STRIP | 3 refills | Status: AC

## 2024-05-04 NOTE — Assessment & Plan Note (Signed)
 HgbA1c is slightly improved, continue focusing on healthy diet and regular exercise.

## 2024-05-04 NOTE — Progress Notes (Signed)
 [place], CMA,acting as a Neurosurgeon for Yvonne Epley, FNP.,have documented all relevant documentation on the behalf of Yvonne Epley, FNP,as directed by  Yvonne Epley, FNP while in the presence of Yvonne Epley, FNP.  Subjective:    Patient ID: Yvonne Barron , female    DOB: February 14, 1946 , 78 y.o.   MRN: [id number]  Chief Complaint  Patient presents with   Annual Exam    Patient presents today for HM, Patient reports compliance with medication. Patient denies any chest pain, SOB, or headaches. Patient has no concerns today.     HPI  Her today for her HM.  She is a patient with a history of neuropathy and possible diabetes. The patient reports frequent urination without associated pain. Blood pressure has been fluctuating, with morning readings around 130-136 over 70. C.W. mentions, 'I don't have any pain nothing like that. But I'm just going to the bathroom.' The patient has been monitoring glucose levels at home and requires more testing strips. Recent dietary habits include consuming salty snacks, which may be contributing to blood pressure fluctuations. She states, 'I like to eat those chips that they have at [facility]. like the little corn chips like they eat with salsa.' The patient acknowledges the need to change some eating habits and is working on it.     Past Medical History:  Diagnosis Date   Arthritis    knee Lt   Cancer (HCC)    2013   History of breast cancer 2009--  INVASIVE DUCTAL IN SITU--  S/P MASTECTOMY   NO RECURRENCE   Hypertension    Pre-diabetes    Urethral caruncle      Family History  Problem Relation Age of Onset   Diabetes Mother    Diabetes Father    Kidney disease Father    Diabetes Brother      Current Outpatient Medications:    cholecalciferol  (VITAMIN D ) 1000 units tablet, Take 1,000 Units by mouth 2 (two) times daily., Disp: , Rfl:    CITRUS BERGAMOT PO, Take 1 capsule by mouth daily at 2 am., Disp: , Rfl:    hydrochlorothiazide   (HYDRODIURIL ) 12.5 MG tablet, Take 1 tablet (12.5 mg total) by mouth daily., Disp: 90 tablet, Rfl: 1   Lancets (ONETOUCH DELICA PLUS LANCET33G) MISC, USE TO TEST BLOOD SUGAR DAILY, Disp: 100 each, Rfl: 3   UNABLE TO FIND, Med Name: vitamins for eyes, Disp: , Rfl:    UNABLE TO FIND, Med Name: healthy feet and nerves, Disp: , Rfl:    vitamin B-12 (CYANOCOBALAMIN ) 500 MCG tablet, Take 500 mcg by mouth daily., Disp: , Rfl:    vitamin C (ASCORBIC ACID ) 500 MG tablet, Take 500 mg by mouth daily., Disp: , Rfl:    glucose blood (ONETOUCH VERIO) test strip, Check blood sugar daily, Disp: 100 each, Rfl: 3   No Known Allergies    The patient states she uses status post hysterectomy No LMP recorded. Patient has had a hysterectomy.   Negative for: breast discharge, breast lump(s), breast pain and breast self exam. Associated symptoms include abnormal vaginal bleeding. Pertinent negatives include abnormal bleeding (hematology), anxiety, decreased libido, depression, difficulty falling sleep, dyspareunia, history of infertility, nocturia, sexual dysfunction, sleep disturbances, urinary incontinence, urinary urgency, vaginal discharge and vaginal itching. Diet regular. The patient states her exercise level is moderate - at least 2-3 days a week.    The patient's tobacco use is:  Social History   Tobacco Use  Smoking Status Former  Current packs/day: 0.00   Average packs/day: 2.0 packs/day for 20.0 years (40.0 ttl pk-yrs)   Types: Cigarettes   Start date: 01/19/1967   Quit date: 01/19/1987   Years since quitting: 37.3  Smokeless Tobacco Never  . She has been exposed to passive smoke. The patient's alcohol  use is:  Social History   Substance and Sexual Activity  Alcohol  Use No   Review of Systems  Constitutional:  Negative for fatigue.  Respiratory: Negative.    Cardiovascular:  Negative for chest pain, palpitations and leg swelling.  Endocrine: Negative for polydipsia, polyphagia and polyuria.   Genitourinary:  Positive for frequency.  Neurological:  Negative for dizziness and weakness.  Psychiatric/Behavioral: Negative.       Today's Vitals   05/04/24 1412 05/04/24 1452  BP: (!) 140/80 110/70  Pulse: 83   Temp: 98 F (36.7 C)   TempSrc: Oral   Weight: 156 lb (70.8 kg)   Height: 5\' 3"  (1.6 m)   PainSc: 0-No pain    Body mass index is 27.63 kg/m.  Wt Readings from Last 3 Encounters:  05/04/24 156 lb (70.8 kg)  03/01/24 153 lb (69.4 kg)  01/23/24 152 lb (68.9 kg)     Objective:  Physical Exam Vitals and nursing note reviewed.  Constitutional:      General: She is not in acute distress.    Appearance: Normal appearance. She is well-developed.  HENT:     Head: Normocephalic and atraumatic.     Right Ear: Hearing, tympanic membrane, ear canal and external ear normal. There is no impacted cerumen.     Left Ear: Hearing, tympanic membrane, ear canal and external ear normal. There is no impacted cerumen.     Nose: Nose normal.     Mouth/Throat:     Mouth: Mucous membranes are moist.  Eyes:     General: Lids are normal.     Extraocular Movements: Extraocular movements intact.     Conjunctiva/sclera: Conjunctivae normal.     Pupils: Pupils are equal, round, and reactive to light.     Funduscopic exam:    Right eye: No papilledema.        Left eye: No papilledema.  Neck:     Thyroid : No thyroid  mass.     Vascular: No carotid bruit.  Cardiovascular:     Rate and Rhythm: Normal rate and regular rhythm.     Pulses: Normal pulses.     Heart sounds: Normal heart sounds. No murmur heard. Pulmonary:     Effort: Pulmonary effort is normal. No respiratory distress.     Breath sounds: Normal breath sounds. No wheezing.  Chest:     Chest wall: No mass.  Breasts:    Tanner Score is 5.     Right: Normal.     Left: Absent.  Abdominal:     General: Abdomen is flat. Bowel sounds are normal.     Palpations: Abdomen is soft.  Musculoskeletal:        General: No swelling  or tenderness. Normal range of motion.     Cervical back: Full passive range of motion without pain, normal range of motion and neck supple.     Right lower leg: No edema.     Left lower leg: No edema.  Skin:    General: Skin is warm and dry.     Capillary Refill: Capillary refill takes less than 2 seconds.  Neurological:     General: No focal deficit present.     Mental  Status: She is alert and oriented to person, place, and time.     Cranial Nerves: No cranial nerve deficit.     Sensory: No sensory deficit.     Motor: No weakness.  Psychiatric:        Mood and Affect: Mood normal.        Behavior: Behavior normal.        Thought Content: Thought content normal.        Judgment: Judgment normal.      Assessment And Plan:     Encounter for annual health examination Assessment & Plan: Behavior modifications discussed and diet history reviewed.   Pt will continue to exercise regularly and modify diet with low GI, plant based foods and decrease intake of processed foods.  Recommend intake of daily multivitamin, Vitamin D , and calcium .  Recommend mammogram and colonoscopy for preventive screenings, as well as recommend immunizations that include influenza, TDAP, and Shingles    Essential hypertension Assessment & Plan: Blood pressure is well controlled, continue current medications. Encouraged to monitor blood pressure regularly. Recommend reducing salt intake, especially salty snacks.  Encourage to increased water  consumption  Orders: -     EKG 12-Lead -     POCT URINALYSIS DIP (CLINITEK) -     Microalbumin / creatinine urine ratio  Type 2 diabetes mellitus with diabetic polyneuropathy, without long-term current use of insulin  (HCC) Assessment & Plan: HgbA1c is slightly improved, continue focusing on healthy diet and regular exercise.  Orders: -     EKG 12-Lead -     POCT URINALYSIS DIP (CLINITEK) -     Microalbumin / creatinine urine ratio -     OneTouch Verio; Check  blood sugar daily  Dispense: 100 each; Refill: 3  Mixed hyperlipidemia Assessment & Plan: Cholesterol levels stable. No current statins.    Overweight with body mass index (BMI) of 27 to 27.9 in adult  Other long term (current) drug therapy     Return for 1 year physical, controlled DM check 3 months. Patient was given opportunity to ask questions. Patient verbalized understanding of the plan and was able to repeat key elements of the plan. All questions were answered to their satisfaction.   Yvonne Epley, FNP  I, Yvonne Epley, FNP, have reviewed all documentation for this visit. The documentation on 05/08/24 for the exam, diagnosis, procedures, and orders are all accurate and complete.

## 2024-05-04 NOTE — Assessment & Plan Note (Addendum)
 Blood pressure is well controlled, continue current medications. Encouraged to monitor blood pressure regularly. Recommend reducing salt intake, especially salty snacks.  Encourage to increased water  consumption

## 2024-05-05 ENCOUNTER — Ambulatory Visit: Payer: Self-pay | Admitting: Nurse Practitioner

## 2024-05-05 LAB — MICROALBUMIN / CREATININE URINE RATIO
Creatinine, Urine: 14.2 mg/dL
Microalb/Creat Ratio: <21 mg/g{creat} (ref 0–29)
Microalbumin, Urine: <3 ug/mL

## 2024-05-08 ENCOUNTER — Other Ambulatory Visit: Payer: Self-pay | Admitting: Nurse Practitioner

## 2024-05-08 DIAGNOSIS — R3121 Asymptomatic microscopic hematuria: Secondary | ICD-10-CM

## 2024-05-08 NOTE — Assessment & Plan Note (Signed)

## 2024-05-08 NOTE — Assessment & Plan Note (Signed)
 Cholesterol levels stable. No current statins.

## 2024-05-11 ENCOUNTER — Ambulatory Visit
Admission: RE | Admit: 2024-05-11 | Discharge: 2024-05-11 | Disposition: A | Discharge status: Home / Self Care | Source: Ambulatory Visit | Attending: Nurse Practitioner | Admitting: Nurse Practitioner

## 2024-05-11 DIAGNOSIS — R319 Hematuria, unspecified: Secondary | ICD-10-CM | POA: Diagnosis not present

## 2024-05-11 DIAGNOSIS — R3121 Asymptomatic microscopic hematuria: Secondary | ICD-10-CM

## 2024-05-11 NOTE — Progress Notes (Unsigned)
 [place], CMA,acting as a Neurosurgeon for [name], FNP.,have documented all relevant documentation on the behalf of [name], FNP,as directed by  [name], FNP while in the presence of [name], FNP.  Subjective:  Patient ID: Yvonne Barron , female    DOB: 10-20-1946 , 78 y.o.   MRN: [id number]  Chief Complaint  Patient presents with   Blood In Stools    Patient presents today for blood in her stool, patient reports it only happened one time on 05/08/2024. Patient reports no pain but may have been constipated. She reports no known history of hemorrhoids.     HPI  On March 31st she had dark blood in the toilet. Just the one time this has occurred. She does admit it happened months ago. She occasionally will get constipated and will take a stool softner. She is drinking maybe 2.5 bottles of water . Denies any nausea or vomiting.      Past Medical History:  Diagnosis Date   Arthritis    knee Lt   Cancer (HCC)    2013   History of breast cancer 2009--  INVASIVE DUCTAL IN SITU--  S/P MASTECTOMY   NO RECURRENCE   Hypertension    Pre-diabetes    Urethral caruncle      Family History  Problem Relation Age of Onset   Diabetes Mother    Diabetes Father    Kidney disease Father    Diabetes Brother      Current Outpatient Medications:    cholecalciferol  (VITAMIN D ) 1000 units tablet, Take 1,000 Units by mouth 2 (two) times daily., Disp: , Rfl:    CITRUS BERGAMOT PO, Take 1 capsule by mouth daily at 2 am., Disp: , Rfl:    glucose blood (ONETOUCH VERIO) test strip, Check blood sugar daily, Disp: 100 each, Rfl: 3   hydrochlorothiazide  (HYDRODIURIL ) 12.5 MG tablet, Take 1 tablet (12.5 mg total) by mouth daily., Disp: 90 tablet, Rfl: 1   Lancets (ONETOUCH DELICA PLUS LANCET33G) MISC, USE TO TEST BLOOD SUGAR DAILY, Disp: 100 each, Rfl: 3   UNABLE TO FIND, Med Name: vitamins for eyes, Disp: , Rfl:    UNABLE TO FIND, Med Name: healthy feet and nerves, Disp: , Rfl:     vitamin B-12 (CYANOCOBALAMIN ) 500 MCG tablet, Take 500 mcg by mouth daily., Disp: , Rfl:    vitamin C (ASCORBIC ACID ) 500 MG tablet, Take 500 mg by mouth daily., Disp: , Rfl:    No Known Allergies   Review of Systems  Constitutional: Negative.   Respiratory: Negative.    Cardiovascular: Negative.   Gastrointestinal:  Positive for blood in stool.  Psychiatric/Behavioral: Negative.       Today's Vitals   05/12/24 0959  BP: 120/80  Pulse: 71  Temp: 98.1 F (36.7 C)  TempSrc: Oral  Weight: 154 lb (69.9 kg)  Height: 5\' 3"  (1.6 m)  PainSc: 0-No pain   Body mass index is 27.28 kg/m.  Wt Readings from Last 3 Encounters:  05/12/24 154 lb (69.9 kg)  05/04/24 156 lb (70.8 kg)  03/01/24 153 lb (69.4 kg)      Objective:  Physical Exam Vitals and nursing note reviewed.  Constitutional:      General: She is not in acute distress.    Appearance: Normal appearance.  Cardiovascular:     Rate and Rhythm: Normal rate and regular rhythm.     Pulses: Normal pulses.     Heart sounds: Normal heart sounds. No murmur heard.  Pulmonary:     Effort: Pulmonary effort is normal. No respiratory distress.     Breath sounds: Normal breath sounds. No wheezing.  Skin:    General: Skin is warm and dry.     Capillary Refill: Capillary refill takes less than 2 seconds.  Neurological:     General: No focal deficit present.     Mental Status: She is alert and oriented to person, place, and time.     Cranial Nerves: No cranial nerve deficit.     Motor: No weakness.  Psychiatric:        Mood and Affect: Mood normal.        Behavior: Behavior normal.        Thought Content: Thought content normal.        Judgment: Judgment normal.     Assessment And Plan:  Blood in stool Assessment & Plan: No blood in stool with hemocult. Encouraged to make sure she is not constipated or straining. If occurs again will need to refer to GI for further evaluation  Orders: -     POC Hemoccult Bld/Stl (1-Cd  Office Dx)    Return for keep same next.  Patient was given opportunity to ask questions. Patient verbalized understanding of the plan and was able to repeat key elements of the plan. All questions were answered to their satisfaction.    Inge Mangle, FNP, have reviewed all documentation for this visit. The documentation on 05/12/24 for the exam, diagnosis, procedures, and orders are all accurate and complete.   IF YOU HAVE BEEN REFERRED TO A SPECIALIST, IT MAY TAKE 1-2 WEEKS TO SCHEDULE/PROCESS THE REFERRAL. IF YOU HAVE NOT HEARD FROM US /SPECIALIST IN TWO WEEKS, PLEASE GIVE US  A CALL AT [phone].

## 2024-05-12 ENCOUNTER — Encounter: Payer: Self-pay | Admitting: Nurse Practitioner

## 2024-05-12 ENCOUNTER — Ambulatory Visit: Payer: Self-pay | Admitting: Nurse Practitioner

## 2024-05-12 VITALS — BP 120/80 | HR 71 | Temp 98.1°F | Ht 63.0 in | Wt 154.0 lb

## 2024-05-12 DIAGNOSIS — T8484XA Pain due to internal orthopedic prosthetic devices, implants and grafts, initial encounter: Secondary | ICD-10-CM | POA: Diagnosis not present

## 2024-05-12 DIAGNOSIS — K921 Melena: Secondary | ICD-10-CM

## 2024-05-12 DIAGNOSIS — M79671 Pain in right foot: Secondary | ICD-10-CM | POA: Diagnosis not present

## 2024-05-12 DIAGNOSIS — M7661 Achilles tendinitis, right leg: Secondary | ICD-10-CM | POA: Diagnosis not present

## 2024-05-12 DIAGNOSIS — M6701 Short Achilles tendon (acquired), right ankle: Secondary | ICD-10-CM | POA: Diagnosis not present

## 2024-05-12 NOTE — Patient Instructions (Signed)
 Take a probiotic every other day or every couple days to regulate your gut.  Increase your water  intake to at least 4 bottles a day.

## 2024-05-16 DIAGNOSIS — K921 Melena: Secondary | ICD-10-CM | POA: Insufficient documentation

## 2024-05-16 NOTE — Assessment & Plan Note (Signed)
 No blood in stool with hemocult. Encouraged to make sure she is not constipated or straining. If occurs again will need to refer to GI for further evaluation

## 2024-05-17 LAB — POC HEMOCCULT BLD/STL (OFFICE/1-CARD/DIAGNOSTIC): Fecal Occult Blood, POC: NEGATIVE

## 2024-06-24 DIAGNOSIS — L728 Other follicular cysts of the skin and subcutaneous tissue: Secondary | ICD-10-CM | POA: Diagnosis not present

## 2024-06-24 DIAGNOSIS — L821 Other seborrheic keratosis: Secondary | ICD-10-CM | POA: Diagnosis not present

## 2024-06-24 DIAGNOSIS — L814 Other melanin hyperpigmentation: Secondary | ICD-10-CM | POA: Diagnosis not present

## 2024-06-24 DIAGNOSIS — D225 Melanocytic nevi of trunk: Secondary | ICD-10-CM | POA: Diagnosis not present

## 2024-08-10 ENCOUNTER — Ambulatory Visit (INDEPENDENT_AMBULATORY_CARE_PROVIDER_SITE_OTHER): Admitting: Nurse Practitioner

## 2024-08-10 ENCOUNTER — Encounter: Payer: Self-pay | Admitting: Nurse Practitioner

## 2024-08-10 VITALS — BP 116/60 | HR 67 | Temp 98.5°F | Ht 63.0 in | Wt 157.0 lb

## 2024-08-10 DIAGNOSIS — I10 Essential (primary) hypertension: Secondary | ICD-10-CM | POA: Diagnosis not present

## 2024-08-10 DIAGNOSIS — E1142 Type 2 diabetes mellitus with diabetic polyneuropathy: Secondary | ICD-10-CM

## 2024-08-10 DIAGNOSIS — E782 Mixed hyperlipidemia: Secondary | ICD-10-CM | POA: Diagnosis not present

## 2024-08-10 DIAGNOSIS — Z6827 Body mass index (BMI) 27.0-27.9, adult: Secondary | ICD-10-CM

## 2024-08-10 DIAGNOSIS — E663 Overweight: Secondary | ICD-10-CM

## 2024-08-10 NOTE — Assessment & Plan Note (Signed)
 HgbA1c is slightly improved, continue focusing on healthy diet and regular exercise.

## 2024-08-10 NOTE — Progress Notes (Addendum)
 I,Jameka J Llittleton, CMA,acting as a neurosurgeon for [employer], FNP.,have documented all relevant documentation on the behalf of Gaines Ada, FNP,as directed by  Gaines Ada, FNP while in the presence of Gaines Ada, FNP.  Subjective:  Patient ID: Yvonne Barron , female    DOB: 1946/02/02 , 78 y.o.   MRN: [id number]  Chief Complaint  Patient presents with   Diabetes    Patient presents today for a bp and dm follow up, Patient reports compliance with medication. Patient denies any chest pain, SOB, or headaches.   Hypertension    HPI Discussed the use of AI scribe software for clinical note transcription with the patient, who gave verbal consent to proceed.  History of Present Illness Yvonne Barron is a 78 year old female with diabetes and hypertension who presents for a follow-up visit.  She monitors her blood glucose levels daily, with readings ranging from 108 to 180 mg/dL, consistently staying below 120 mg/dL. Her A1c levels have been over 6.5% in the past, but are currently below that threshold. She does not require any medication refills at this time.  She continues to exercise regularly, with her weight fluctuating between 150 and 157 pounds, influenced by her fluid intake and urination patterns. Her total cholesterol is 208 mg/dL. She recalls having a calcium  scan in December of last year.  She has arthritis in her big toe and uses pain cream for relief. She plans to order a device to keep her toe straight.  She has not received a flu shot yet this season and is due for a COVID booster, having only received the initial two doses.    Diabetes She presents for her follow-up diabetic visit. Her disease course has been stable. There are no hypoglycemic associated symptoms. Pertinent negatives for hypoglycemia include no dizziness. There are no diabetic associated symptoms. Pertinent negatives for diabetes include no chest pain, no polydipsia, no polyphagia, no  polyuria and no weakness. There are no hypoglycemic complications. Symptoms are stable. Diabetic complications include peripheral neuropathy. Risk factors for coronary artery disease include post-menopausal. Current diabetic treatment includes oral agent (monotherapy). She is compliant with treatment all of the time. Her weight is stable. She is following a generally healthy diet. When asked about meal planning, she reported none. She has not had a previous visit with a dietitian. Exercise: two days a week - 45 minutes a day. There is no change in her home blood glucose trend. ( Blood sugar has been ranging 108-118, checks once a day) An ACE inhibitor/angiotensin II receptor blocker is being taken. She does not see a podiatrist.Eye exam is not current (Appt on July 19th).  Hypertension This is a chronic problem. The current episode started more than 1 year ago. The problem has been gradually improving since onset. The problem is controlled. Pertinent negatives include no anxiety, chest pain or palpitations. There are no associated agents to hypertension. Risk factors for coronary artery disease include diabetes mellitus. Past treatments include calcium  channel blockers and diuretics. There are no compliance problems.      Past Medical History:  Diagnosis Date   Arthritis    knee Lt   Cancer (HCC)    2013   History of breast cancer 2009--  INVASIVE DUCTAL IN SITU--  S/P MASTECTOMY   NO RECURRENCE   Hypertension    Pre-diabetes    Urethral caruncle      Family History  Problem Relation Age of Onset   Diabetes Mother  Diabetes Father    Kidney disease Father    Diabetes Brother      Current Outpatient Medications:    cholecalciferol  (VITAMIN D ) 1000 units tablet, Take 1,000 Units by mouth 2 (two) times daily., Disp: , Rfl:    CITRUS BERGAMOT PO, Take 1 capsule by mouth daily at 2 am., Disp: , Rfl:    glucose blood (ONETOUCH VERIO) test strip, Check blood sugar daily, Disp: 100 each,  Rfl: 3   hydrochlorothiazide  (HYDRODIURIL ) 12.5 MG tablet, Take 1 tablet (12.5 mg total) by mouth daily., Disp: 90 tablet, Rfl: 1   Lancets (ONETOUCH DELICA PLUS LANCET33G) MISC, USE TO TEST BLOOD SUGAR DAILY, Disp: 100 each, Rfl: 3   UNABLE TO FIND, Med Name: vitamins for eyes, Disp: , Rfl:    UNABLE TO FIND, Med Name: healthy feet and nerves, Disp: , Rfl:    vitamin B-12 (CYANOCOBALAMIN ) 500 MCG tablet, Take 500 mcg by mouth daily., Disp: , Rfl:    vitamin C (ASCORBIC ACID ) 500 MG tablet, Take 500 mg by mouth daily., Disp: , Rfl:    No Known Allergies   Review of Systems  Constitutional: Negative.   Eyes: Negative.   Cardiovascular:  Negative for chest pain and palpitations.  Endocrine: Negative for polydipsia, polyphagia and polyuria.  Musculoskeletal: Negative.   Skin: Negative.   Neurological:  Negative for dizziness and weakness.  Psychiatric/Behavioral: Negative.       Today's Vitals   08/10/24 1418  BP: 116/60  Pulse: 67  Temp: 98.5 F (36.9 C)  TempSrc: Oral  Weight: 157 lb (71.2 kg)  Height: 5' 3 (1.6 m)  PainSc: 0-No pain   Body mass index is 27.81 kg/m.  Wt Readings from Last 3 Encounters:  08/10/24 157 lb (71.2 kg)  05/12/24 154 lb (69.9 kg)  05/04/24 156 lb (70.8 kg)     Objective:  Physical Exam Vitals and nursing note reviewed.  Constitutional:      General: She is not in acute distress.    Appearance: Normal appearance.  Cardiovascular:     Rate and Rhythm: Normal rate and regular rhythm.     Pulses: Normal pulses.     Heart sounds: Normal heart sounds. No murmur heard. Pulmonary:     Effort: Pulmonary effort is normal. No respiratory distress.     Breath sounds: Normal breath sounds. No wheezing.  Skin:    General: Skin is warm and dry.     Capillary Refill: Capillary refill takes less than 2 seconds.  Neurological:     General: No focal deficit present.     Mental Status: She is alert and oriented to person, place, and time.     Cranial  Nerves: No cranial nerve deficit.     Motor: No weakness.  Psychiatric:        Mood and Affect: Mood normal.        Behavior: Behavior normal.        Thought Content: Thought content normal.        Judgment: Judgment normal.      Diabetic foot exam was performed with the following findings:   No deformities, ulcerations, or other skin breakdown Normal sensation of 10g monofilament Intact posterior tibialis and dorsalis pedis pulses        Assessment And Plan:  Type 2 diabetes mellitus with diabetic polyneuropathy, without long-term current use of insulin  (HCC) Assessment & Plan: HgbA1c is slightly improved, continue focusing on healthy diet and regular exercise.  Orders: -  CBC -     CMP14+EGFR -     Hemoglobin A1c  Mixed hyperlipidemia Assessment & Plan: Total cholesterol 208 mg/dL, ASCVD risk 65.3%. - Discussed potential statin therapy, not highly recommended. - Emphasized dietary modifications.  Orders: -     CMP14+EGFR -     Lipid panel  Essential hypertension Assessment & Plan: Blood pressure is well controlled, continue current medications. Will check eGFR  Orders: -     CMP14+EGFR  Overweight with body mass index (BMI) of 27 to 27.9 in adult    Return for controlled DM check-4 months.  Patient was given opportunity to ask questions. Patient verbalized understanding of the plan and was able to repeat key elements of the plan. All questions were answered to their satisfaction.    LILLETTE Gaines Ada, FNP, have reviewed all documentation for this visit. The documentation on 08/10/24 for the exam, diagnosis, procedures, and orders are all accurate and complete.   IF YOU HAVE BEEN REFERRED TO A SPECIALIST, IT MAY TAKE 1-2 WEEKS TO SCHEDULE/PROCESS THE REFERRAL. IF YOU HAVE NOT HEARD FROM US /SPECIALIST IN TWO WEEKS, PLEASE GIVE US  A CALL AT [phone].

## 2024-08-10 NOTE — Assessment & Plan Note (Signed)
 Blood pressure is well controlled, continue current medications. Will check eGFR

## 2024-08-10 NOTE — Patient Instructions (Signed)

## 2024-08-11 LAB — CBC
Hematocrit: 39.4 % (ref 34.0–46.6)
Hemoglobin: 12 g/dL (ref 11.1–15.9)
MCH: 26.3 pg — ABNORMAL LOW (ref 26.6–33.0)
MCHC: 30.5 g/dL — ABNORMAL LOW (ref 31.5–35.7)
MCV: 86 fL (ref 79–97)
Platelets: 357 x10E3/uL (ref 150–450)
RBC: 4.57 x10E6/uL (ref 3.77–5.28)
RDW: 13.3 % (ref 11.7–15.4)
WBC: 5.9 x10E3/uL (ref 3.4–10.8)

## 2024-08-11 LAB — HEMOGLOBIN A1C
Est. average glucose Bld gHb Est-mCnc: 137 mg/dL
Hgb A1c MFr Bld: 6.4 % — ABNORMAL HIGH (ref 4.8–5.6)

## 2024-08-11 LAB — CMP14+EGFR
ALT: 7 IU/L (ref 0–32)
AST: 17 IU/L (ref 0–40)
Albumin: 4.5 g/dL (ref 3.8–4.8)
Alkaline Phosphatase: 87 IU/L (ref 44–121)
BUN/Creatinine Ratio: 19 (ref 12–28)
BUN: 14 mg/dL (ref 8–27)
Bilirubin Total: 0.4 mg/dL (ref 0.0–1.2)
CO2: 25 mmol/L (ref 20–29)
Calcium: 9.8 mg/dL (ref 8.7–10.3)
Chloride: 99 mmol/L (ref 96–106)
Creatinine, Ser: 0.74 mg/dL (ref 0.57–1.00)
Globulin, Total: 2.6 g/dL (ref 1.5–4.5)
Glucose: 81 mg/dL (ref 70–99)
Potassium: 4.1 mmol/L (ref 3.5–5.2)
Sodium: 140 mmol/L (ref 134–144)
Total Protein: 7.1 g/dL (ref 6.0–8.5)
eGFR: 84 mL/min/1.73 (ref >59)

## 2024-08-11 LAB — LIPID PANEL
Chol/HDL Ratio: 2.9 ratio (ref 0.0–4.4)
Cholesterol, Total: 227 mg/dL — ABNORMAL HIGH (ref 100–199)
HDL: 79 mg/dL (ref >39)
LDL Chol Calc (NIH): 138 mg/dL — ABNORMAL HIGH (ref 0–99)
Triglycerides: 60 mg/dL (ref 0–149)
VLDL Cholesterol Cal: 10 mg/dL (ref 5–40)

## 2024-08-22 ENCOUNTER — Ambulatory Visit: Payer: Self-pay | Admitting: Nurse Practitioner

## 2024-08-22 NOTE — Assessment & Plan Note (Signed)
 Total cholesterol 208 mg/dL, ASCVD risk 65.3%. - Discussed potential statin therapy, not highly recommended. - Emphasized dietary modifications.

## 2024-08-25 DIAGNOSIS — E119 Type 2 diabetes mellitus without complications: Secondary | ICD-10-CM | POA: Diagnosis not present

## 2024-08-25 DIAGNOSIS — H353132 Nonexudative age-related macular degeneration, bilateral, intermediate dry stage: Secondary | ICD-10-CM | POA: Diagnosis not present

## 2024-08-25 DIAGNOSIS — H35033 Hypertensive retinopathy, bilateral: Secondary | ICD-10-CM | POA: Diagnosis not present

## 2024-08-25 DIAGNOSIS — H25813 Combined forms of age-related cataract, bilateral: Secondary | ICD-10-CM | POA: Diagnosis not present

## 2024-08-25 LAB — HM DIABETES EYE EXAM

## 2024-09-07 ENCOUNTER — Other Ambulatory Visit: Payer: Self-pay | Admitting: Nurse Practitioner

## 2024-09-07 DIAGNOSIS — I10 Essential (primary) hypertension: Secondary | ICD-10-CM

## 2024-09-25 DIAGNOSIS — H52223 Regular astigmatism, bilateral: Secondary | ICD-10-CM | POA: Diagnosis not present

## 2024-10-29 ENCOUNTER — Encounter: Payer: Self-pay | Admitting: Nurse Practitioner

## 2024-10-29 ENCOUNTER — Other Ambulatory Visit: Payer: Self-pay | Admitting: Nurse Practitioner

## 2024-10-29 DIAGNOSIS — Z1231 Encounter for screening mammogram for malignant neoplasm of breast: Secondary | ICD-10-CM

## 2024-11-09 ENCOUNTER — Ambulatory Visit: Payer: Self-pay | Admitting: *Deleted

## 2024-11-09 NOTE — Telephone Encounter (Signed)
 Please advise regarding patient request to correct entry in chart. Unable to find incorrect entry at this time but patient will send request via my chart if she can find again. Incorrect entry have not been able to exercise due to her disabled father  and father has be deceased for years.      FYI Only or Action Required?: FYI only for provider: appointment scheduled on 11/10/24.  Patient was last seen in primary care on 08/10/2024 by Georgina Speaks, FNP.  Called Nurse Triage reporting Breast Pain.  Symptoms began several months ago.  Interventions attempted: Nothing.  Symptoms are: unchanged.  Triage Disposition: See PCP Within 2 Weeks 24 hours to 2 weeks   Patient/caregiver understands and will follow disposition?: Yes   Patient wants to scheduled mammogram .             Copied from CRM #[id number]. Topic: Clinical - Red Word Triage >> Nov 09, 2024  1:47 PM Wess RAMAN wrote: Red Word that prompted transfer to Nurse Triage: Pain in left side of breast. Reason for Disposition  [1] Breast pain AND [2] cause is not known  Answer Assessment - Initial Assessment Questions Patient requesting to schedule annual mammogram and reported pain. Appt scheduled with PCP for 11/10/24 for evaluation prior to scheduling mammogram. Recommended if fever worsening sx occur call back or go to UC/ED if needed. Patient also requesting a correction be made in her chart. Patient reports she can not remember the date or which entry or clinician entered the information, but patient read have not been able to exercise due to her disable father. Patient reports her father has been deceased for years. Please advise.        1. SYMPTOM: What's the main symptom you're concerned about?  (e.g., lump, nipple discharge, pain, rash)     Pain intermittent left side of breast/ mastectomy area  2. LOCATION: Where is the pain located?     Left side of breast /mastectomy area 3. ONSET: When did sx    start?     Couple of months ago  4. PRIOR HISTORY: Do you have any history of prior problems with your breasts? (e.g., breast cancer, breast implant, fibrocystic breast disease)     Left mastectomy . 5. CAUSE: What do you think is causing this symptom?     Not sure  6. OTHER SYMPTOMS: Do you have any other symptoms? (e.g., breast pain, fever, nipple discharge, redness or rash)     Intermittent pain left side of breast / mastectomy ,possible scar tissue. Reports as stabbing pain at times 8/10. Pain comes and goes. Noted mole in the same area that is new.  7. PREGNANCY-BREASTFEEDING: Is there any chance you are pregnant? When was your last menstrual period? Are you breastfeeding?     na  Protocols used: Breast Symptoms-A-AH

## 2024-11-10 ENCOUNTER — Ambulatory Visit: Payer: Self-pay | Admitting: Nurse Practitioner

## 2024-11-10 VITALS — BP 120/70 | HR 77 | Temp 97.9°F | Ht 63.0 in | Wt 153.4 lb

## 2024-11-10 DIAGNOSIS — Z2821 Immunization not carried out because of patient refusal: Secondary | ICD-10-CM

## 2024-11-10 DIAGNOSIS — Z6827 Body mass index (BMI) 27.0-27.9, adult: Secondary | ICD-10-CM | POA: Diagnosis not present

## 2024-11-10 DIAGNOSIS — E1142 Type 2 diabetes mellitus with diabetic polyneuropathy: Secondary | ICD-10-CM | POA: Diagnosis not present

## 2024-11-10 DIAGNOSIS — N644 Mastodynia: Secondary | ICD-10-CM | POA: Diagnosis not present

## 2024-11-10 DIAGNOSIS — Z1231 Encounter for screening mammogram for malignant neoplasm of breast: Secondary | ICD-10-CM | POA: Diagnosis not present

## 2024-11-10 DIAGNOSIS — E663 Overweight: Secondary | ICD-10-CM | POA: Diagnosis not present

## 2024-11-10 NOTE — Progress Notes (Unsigned)
 LILLETTE Kristeen JINNY Gladis, CMA,acting as a neurosurgeon for Gaines Ada, FNP.,have documented all relevant documentation on the behalf of Gaines Ada, FNP,as directed by  Gaines Ada, FNP while in the presence of Gaines Ada, FNP.  Subjective:  Patient ID: Yvonne Barron , female    DOB: 1946-07-09 , 78 y.o.   MRN: [id number]  Chief Complaint  Patient presents with   Breast Pain    Patient presents today for left breast/mastectomy pain. She reports the pain is on and off, she reports she tried to schedule her mammogram and they requested she get evaluated. Patient reports she has had a ultrasound of her left breast before for the same pain and was told it was scar tissue. Patient reports the pain is like a stabbing pain with a little shock feeling. Patient also reports a new mole that she has noticed in the same area.    She has the intermittent breast pain on the left side since her mastectomy. When she was getting ready to have her mammogram they asked her about pain and she had to be evaluated.     Discussed the use of AI scribe software for clinical note transcription with the patient, who gave verbal consent to proceed.  History of Present Illness   Past Medical History:  Diagnosis Date   Arthritis    knee Lt   Cancer (HCC)    2013   History of breast cancer 2009--  INVASIVE DUCTAL IN SITU--  S/P MASTECTOMY   NO RECURRENCE   Hypertension    Pre-diabetes    Urethral caruncle      Family History  Problem Relation Age of Onset   Diabetes Mother    Diabetes Father    Kidney disease Father    Diabetes Brother      Current Outpatient Medications:    cholecalciferol  (VITAMIN D ) 1000 units tablet, Take 1,000 Units by mouth 2 (two) times daily., Disp: , Rfl:    CITRUS BERGAMOT PO, Take 1 capsule by mouth daily at 2 am., Disp: , Rfl:    glucose blood (ONETOUCH VERIO) test strip, Check blood sugar daily, Disp: 100 each, Rfl: 3   hydrochlorothiazide  (HYDRODIURIL ) 12.5 MG tablet,  Take 1 tablet by mouth once daily, Disp: 90 tablet, Rfl: 0   Lancets (ONETOUCH DELICA PLUS LANCET33G) MISC, USE TO TEST BLOOD SUGAR DAILY, Disp: 100 each, Rfl: 3   UNABLE TO FIND, Med Name: vitamins for eyes, Disp: , Rfl:    UNABLE TO FIND, Med Name: healthy feet and nerves, Disp: , Rfl:    vitamin C (ASCORBIC ACID ) 500 MG tablet, Take 500 mg by mouth daily., Disp: , Rfl:    No Known Allergies   Review of Systems  Constitutional: Negative.   Respiratory: Negative.    Cardiovascular: Negative.   Skin:        She has a mole to her left lateral breast.  Neurological: Negative.   Psychiatric/Behavioral: Negative.       Today's Vitals   11/10/24 1608  BP: 120/70  Pulse: 77  Temp: 97.9 F (36.6 C)  TempSrc: Oral  Weight: 153 lb 6.4 oz (69.6 kg)  Height: 5' 3 (1.6 m)  PainSc: 0-No pain   Body mass index is 27.17 kg/m.  Wt Readings from Last 3 Encounters:  11/10/24 153 lb 6.4 oz (69.6 kg)  08/10/24 157 lb (71.2 kg)  05/12/24 154 lb (69.9 kg)      Objective:  Physical Exam Vitals and nursing note reviewed.  Constitutional:      General: She is not in acute distress.    Appearance: Normal appearance.  Cardiovascular:     Rate and Rhythm: Normal rate and regular rhythm.     Pulses: Normal pulses.     Heart sounds: Normal heart sounds. No murmur heard. Pulmonary:     Effort: Pulmonary effort is normal. No respiratory distress.     Breath sounds: Normal breath sounds. No wheezing.  Skin:    General: Skin is warm and dry.     Capillary Refill: Capillary refill takes less than 2 seconds.  Neurological:     General: No focal deficit present.     Mental Status: She is alert and oriented to person, place, and time.     Cranial Nerves: No cranial nerve deficit.     Motor: No weakness.  Psychiatric:        Mood and Affect: Mood normal.        Behavior: Behavior normal.        Thought Content: Thought content normal.        Judgment: Judgment normal.         Assessment  And Plan:   Assessment & Plan Breast pain, left  Overweight with body mass index (BMI) of 27 to 27.9 in adult  Influenza vaccination declined   No orders of the defined types were placed in this encounter.  Assessment & Plan    Return for keep same next.  Patient was given opportunity to ask questions. Patient verbalized understanding of the plan and was able to repeat key elements of the plan. All questions were answered to their satisfaction.    LILLETTE Gaines Ada, FNP, have reviewed all documentation for this visit. The documentation on 11/10/24 for the exam, diagnosis, procedures, and orders are all accurate and complete.   IF YOU HAVE BEEN REFERRED TO A SPECIALIST, IT MAY TAKE 1-2 WEEKS TO SCHEDULE/PROCESS THE REFERRAL. IF YOU HAVE NOT HEARD FROM US /SPECIALIST IN TWO WEEKS, PLEASE GIVE US  A CALL AT [phone].

## 2024-11-21 NOTE — Assessment & Plan Note (Addendum)
 Diabetes management ongoing. Regular glucose monitoring necessary. Next appointment in June is too long. - Scheduled follow-up appointment for diabetes management in February.

## 2024-11-30 ENCOUNTER — Other Ambulatory Visit: Payer: Self-pay | Admitting: Nurse Practitioner

## 2024-11-30 DIAGNOSIS — Z853 Personal history of malignant neoplasm of breast: Secondary | ICD-10-CM

## 2024-11-30 DIAGNOSIS — N644 Mastodynia: Secondary | ICD-10-CM

## 2024-12-04 ENCOUNTER — Other Ambulatory Visit: Payer: Self-pay | Admitting: Nurse Practitioner

## 2024-12-04 DIAGNOSIS — I10 Essential (primary) hypertension: Secondary | ICD-10-CM

## 2024-12-07 ENCOUNTER — Other Ambulatory Visit: Payer: Self-pay

## 2024-12-07 MED ORDER — ACCU-CHEK GUIDE W/DEVICE KIT: PACK | 0 refills | Status: AC

## 2024-12-14 ENCOUNTER — Encounter: Payer: Self-pay | Admitting: Emergency Medicine

## 2024-12-14 ENCOUNTER — Ambulatory Visit: Admission: EM | Admit: 2024-12-14 | Discharge: 2024-12-14 | Disposition: A | Discharge status: Home / Self Care

## 2024-12-14 DIAGNOSIS — J209 Acute bronchitis, unspecified: Secondary | ICD-10-CM | POA: Diagnosis not present

## 2024-12-14 DIAGNOSIS — J069 Acute upper respiratory infection, unspecified: Secondary | ICD-10-CM | POA: Diagnosis not present

## 2024-12-14 MED ORDER — BENZONATATE 100 MG PO CAPS: 100.0000 mg | ORAL_CAPSULE | Freq: Three times a day (TID) | ORAL | 0 refills | Status: AC | PRN

## 2024-12-14 MED ORDER — PREDNISONE 50 MG PO TABS: ORAL_TABLET | ORAL | 0 refills | Status: AC

## 2024-12-14 MED ORDER — GUAIFENESIN ER 600 MG PO TB12: 600.0000 mg | ORAL_TABLET | Freq: Two times a day (BID) | ORAL | 0 refills | Status: AC

## 2024-12-14 NOTE — Discharge Instructions (Addendum)
 You have been diagnosed with bronchitis.  Sent Tessalon , Mucinex , and prednisone  to the pharmacy for your symptoms.  Please start taking these medications today as they will be symptoms.  We discussed that you do not receive your symptoms from the flu shot days, symptoms that your symptoms started after you got your flu vaccination.  It takes about 2 weeks to be fully protected from the flu from the flu vaccine.  That being said, you can still acquire the flu even though you have been vaccinated against the flu.  Being vaccinated against flu helps the flu not be life-threatening and has severe course.

## 2024-12-14 NOTE — ED Triage Notes (Signed)
 Pt presents c/o URI x 9 days. Pt states,  I got the flu shot last week and I have been sick ever since. I have head pressure, coughing, fatigue, and my nose is bleeding from wiping so often.  Pt denies emesis and diarrhea.

## 2024-12-14 NOTE — ED Provider Notes (Signed)
 " [facility]    CSN: [id number] Arrival date & time: 12/14/24  1129      History   Chief Complaint Chief Complaint  Patient presents with   URI    HPI Yvonne Barron is a 79 y.o. female.   Patient presents today due to 9 days worth of persistent cough, nasal congestion, headache, fatigue, and scant nasal bleeding.  Patient denies fever, chills, nausea, vomiting, or change in appetite.  Patient states she has been trying to treat herself symptomatically with OTC cold medications with no significant relief.  The history is provided by the patient.  URI   Past Medical History:  Diagnosis Date   Arthritis    knee Lt   Cancer (HCC)    2013   History of breast cancer 2009--  INVASIVE DUCTAL IN SITU--  S/P MASTECTOMY   NO RECURRENCE   Hypertension    Pre-diabetes    Urethral caruncle     Patient Active Problem List   Diagnosis Date Noted   Overweight with body mass index (BMI) of 27 to 27.9 in adult 08/10/2024   Blood in stool 05/16/2024   Encounter for annual health examination 05/04/2024   Pain in Achilles tendon 03/09/2024   Mass of right axilla 03/09/2024   Need for influenza vaccination 11/05/2023   COVID-19 vaccination declined 11/05/2023   Need for Tdap vaccination 11/05/2023   Mixed hyperlipidemia 11/05/2023   Tetanus, diphtheria, and acellular pertussis (Tdap) vaccination declined 04/29/2023   Elevated cholesterol 04/29/2023   Oropharyngeal dysphagia 04/29/2023   Diabetic polyneuropathy associated with type 2 diabetes mellitus (HCC) 01/14/2023   H/O total knee replacement, left 07/28/2020   Type 2 diabetes mellitus with diabetic polyneuropathy, without long-term current use of insulin  (HCC) 04/15/2019   Pain in right knee 11/13/2018   Vitamin D  deficiency 08/29/2018   Cervicalgia 08/29/2018   Decreased estrogen level 08/29/2018   Urinary frequency 08/29/2018   Urethral caruncle 07/10/2012   PARESTHESIA 07/01/2008   Breast cancer in  situ 12/10/2007   Essential hypertension 06/17/2007    Past Surgical History:  Procedure Laterality Date   BREAST SURGERY  2013   CYSTOSCOPY WITH URETHRAL CARUNCLE  07/10/2012   Procedure: CYSTOSCOPY WITH URETHRAL CARUNCLE;  Surgeon: Mark C Ottelin, MD;  Location: [facility];  Service: Urology;  Laterality: N/A;    EXCISION OF CARUNCLE    JOINT REPLACEMENT  L/knee   LEFT TOTAL MASTECTOMY/ LEFT AXILLARY SENTINEL LYMPH NODE BX  03/01/2008   INVASIVE  DUCTAL CARCINOMA IN SITU   LEFT WRIST SURG  2008   REPAIR NERVE   MASTECTOMY Left 2009   TOE SURGERY Right 2017   Great toe   TOTAL KNEE ARTHROPLASTY Left 07/28/2020   Procedure: TOTAL KNEE ARTHROPLASTY;  Surgeon: Kay Kemps, MD;  Location: [facility];  Service: Orthopedics;  Laterality: Left;   VAGINAL HYSTERECTOMY  1998   W/ BILATERAL SALPINGOOPHECTOMY    OB History   No obstetric history on file.      Home Medications    Prior to Admission medications  Medication Sig Start Date End Date Taking? Authorizing Provider  guaiFENesin  (MUCINEX ) 600 MG 12 hr tablet Take 1 tablet (600 mg total) by mouth 2 (two) times daily for 10 days. 12/14/24 12/24/24 Yes Andra Krabbe C, PA-C  hydrochlorothiazide  (HYDRODIURIL ) 12.5 MG tablet Take 1 tablet by mouth once daily 12/06/24  Yes Moore, Janece, FNP  predniSONE  (DELTASONE ) 50 MG tablet Take 1 tab po daily for 5 days 12/14/24  Yes Andra Krabbe C, PA-C  amLODipine  (NORVASC ) 2.5 MG tablet Take 1 tablet by mouth daily.    [provider]  benzonatate  (TESSALON ) 100 MG capsule Take 1 capsule (100 mg total) by mouth 3 (three) times daily as needed for cough. 12/14/24   Andra Krabbe BROCKS, PA-C  Blood Glucose Monitoring Suppl (ACCU-CHEK GUIDE) w/Device KIT Use as directed to check blood sugars. 12/07/24   Moore, Janece, FNP  cholecalciferol  (VITAMIN D ) 1000 units tablet Take 1,000 Units by mouth 2 (two) times daily.    [provider]  CITRUS BERGAMOT PO  Take 1 capsule by mouth daily at 2 am.    [provider]  glucose blood (ONETOUCH VERIO) test strip Check blood sugar daily 05/04/24   Georgina Speaks, FNP  HYDROcodone -acetaminophen  (NORCO) 10-325 MG tablet TAKE 1/2 (ONE-HALF) TABLET BY MOUTH EVERY 6 HOURS AS NEEDED FOR PAIN    [provider]  Lancets (ONETOUCH DELICA PLUS LANCET33G) MISC USE TO TEST BLOOD SUGAR DAILY 07/31/22   Georgina Speaks, FNP  UNABLE TO FIND Med Name: vitamins for eyes    [provider]  UNABLE TO FIND Med Name: healthy feet and nerves    [provider]  vitamin C (ASCORBIC ACID ) 500 MG tablet Take 500 mg by mouth daily.    [provider]    Family History Family History  Problem Relation Age of Onset   Diabetes Mother    Diabetes Father    Kidney disease Father    Diabetes Brother     Social History Social History[1]   Allergies   Patient has no known allergies.   Review of Systems Review of Systems   Physical Exam Triage Vital Signs ED Triage Vitals  Encounter Vitals Group     BP 12/14/24 1257 136/78     Girls Systolic BP Percentile --      Girls Diastolic BP Percentile --      Boys Systolic BP Percentile --      Boys Diastolic BP Percentile --      Pulse Rate 12/14/24 1257 71     Resp 12/14/24 1257 18     Temp 12/14/24 1257 98.1 F (36.7 C)     Temp Source 12/14/24 1257 Oral     SpO2 12/14/24 1257 96 %     Weight 12/14/24 1256 153 lb 7 oz (69.6 kg)     Height --      Head Circumference --      Peak Flow --      Pain Score 12/14/24 1255 5     Pain Loc --      Pain Education --      Exclude from Growth Chart --    No data found.  Updated Vital Signs BP 136/78 (BP Location: Left Arm)   Pulse 71   Temp 98.1 F (36.7 C) (Oral)   Resp 18   Wt 153 lb 7 oz (69.6 kg)   LMP  (LMP Unknown)   SpO2 96%   BMI 27.18 kg/m   Visual Acuity Right Eye Distance:   Left Eye Distance:   Bilateral Distance:    Right Eye Near:   Left Eye Near:     Bilateral Near:     Physical Exam Vitals and nursing note reviewed.  Constitutional:      General: She is not in acute distress.    Appearance: Normal appearance. She is not ill-appearing, toxic-appearing or diaphoretic.  HENT:     Nose: Congestion (  moderately enlarged turbinates) present. No rhinorrhea.     Mouth/Throat:     Mouth: Mucous membranes are moist.     Pharynx: Oropharynx is clear. No oropharyngeal exudate or posterior oropharyngeal erythema.  Eyes:     General: No scleral icterus. Cardiovascular:     Rate and Rhythm: Normal rate and regular rhythm.     Heart sounds: Normal heart sounds.  Pulmonary:     Effort: Pulmonary effort is normal. No respiratory distress.     Breath sounds: Normal breath sounds. No wheezing or rhonchi.  Skin:    General: Skin is warm.  Neurological:     Mental Status: She is alert and oriented to person, place, and time.  Psychiatric:        Mood and Affect: Mood normal.        Behavior: Behavior normal.      UC Treatments / Results  Labs (all labs ordered are listed, but only abnormal results are displayed) Labs Reviewed - No data to display  EKG   Radiology No results found.  Procedures Procedures (including critical care time)  Medications Ordered in UC Medications - No data to display  Initial Impression / Assessment and Plan / UC Course  I have reviewed the triage vital signs and the nursing notes.  Pertinent labs & imaging results that were available during my care of the patient were reviewed by me and considered in my medical decision making (see chart for details).     Final Clinical Impressions(s) / UC Diagnoses   Final diagnoses:  Viral URI  Acute bronchitis, unspecified organism     Discharge Instructions      You have been diagnosed with bronchitis.  Sent Tessalon , Mucinex , and prednisone  to the pharmacy for your symptoms.  Please start taking these medications today as they will be symptoms.  We  discussed that you do not receive your symptoms from the flu shot days, symptoms that your symptoms started after you got your flu vaccination.  It takes about 2 weeks to be fully protected from the flu from the flu vaccine.  That being said, you can still acquire the flu even though you have been vaccinated against the flu.  Being vaccinated against flu helps the flu not be life-threatening and has severe course.    ED Prescriptions     Medication Sig Dispense Auth. Provider   benzonatate  (TESSALON ) 100 MG capsule Take 1 capsule (100 mg total) by mouth 3 (three) times daily as needed for cough. 30 capsule Andra Corean BROCKS, PA-C   guaiFENesin  (MUCINEX ) 600 MG 12 hr tablet Take 1 tablet (600 mg total) by mouth 2 (two) times daily for 10 days. 20 tablet Elijahjames Fuelling C, PA-C   predniSONE  (DELTASONE ) 50 MG tablet Take 1 tab po daily for 5 days 5 tablet Andra Corean BROCKS, PA-C      PDMP not reviewed this encounter.    [1]  Social History Tobacco Use   Smoking status: Former    Current packs/day: 0.00    Average packs/day: 2.0 packs/day for 20.0 years (40.0 ttl pk-yrs)    Types: Cigarettes    Start date: 01/19/1967    Quit date: 01/19/1987    Years since quitting: 37.9    Passive exposure: Past   Smokeless tobacco: Never  Vaping Use   Vaping status: Never Used  Substance Use Topics   Alcohol  use: No   Drug use: Never     Andra Corean BROCKS, PA-C 12/14/24 1351  "

## 2024-12-15 ENCOUNTER — Other Ambulatory Visit: Payer: Self-pay

## 2024-12-15 ENCOUNTER — Other Ambulatory Visit: Payer: Self-pay | Admitting: Nurse Practitioner

## 2024-12-15 MED ORDER — ACCU-CHEK GUIDE TEST VI STRP: ORAL_STRIP | 12 refills | Status: DC

## 2024-12-15 NOTE — Telephone Encounter (Signed)
 Copied from CRM [id number]. Topic: Clinical - Medication Refill >> Dec 15, 2024  3:08 PM Carlyon D wrote: Medication: Accucheck test strips   Has the patient contacted their pharmacy? No (Agent: If no, request that the patient contact the pharmacy for the refill. If patient does not wish to contact the pharmacy document the reason why and proceed with request.) (Agent: If yes, when and what did the pharmacy advise?)  This is the patient's preferred pharmacy:  [facility] [address] ([address]), [place] - [address] [place] ([place]) [place] [address] Phone: [phone] Fax: [phone]  Is this the correct pharmacy for this prescription? Yes If no, delete pharmacy and type the correct one.   Has the prescription been filled recently? No  Is the patient out of the medication? Yes  Has the patient been seen for an appointment in the last year OR does the patient have an upcoming appointment? Yes  Can we respond through MyChart? Yes  Agent: Please be advised that Rx refills may take up to 3 business days. We ask that you follow-up with your pharmacy.

## 2024-12-16 ENCOUNTER — Ambulatory Visit: Admitting: Nurse Practitioner

## 2024-12-20 ENCOUNTER — Other Ambulatory Visit: Payer: Self-pay | Admitting: Nurse Practitioner

## 2024-12-20 DIAGNOSIS — Z853 Personal history of malignant neoplasm of breast: Secondary | ICD-10-CM

## 2024-12-20 DIAGNOSIS — N644 Mastodynia: Secondary | ICD-10-CM

## 2024-12-20 MED ORDER — ACCU-CHEK GUIDE TEST VI STRP: ORAL_STRIP | 12 refills | Status: AC

## 2024-12-30 ENCOUNTER — Ambulatory Visit
Admission: RE | Admit: 2024-12-30 | Discharge: 2024-12-30 | Disposition: A | Discharge status: Home / Self Care | Source: Ambulatory Visit | Attending: Nurse Practitioner | Admitting: Nurse Practitioner

## 2024-12-30 DIAGNOSIS — N644 Mastodynia: Secondary | ICD-10-CM

## 2024-12-30 DIAGNOSIS — Z853 Personal history of malignant neoplasm of breast: Secondary | ICD-10-CM

## 2025-01-10 ENCOUNTER — Ambulatory Visit: Payer: Self-pay | Admitting: Nurse Practitioner

## 2025-01-11 ENCOUNTER — Ambulatory Visit: Admitting: Nurse Practitioner

## 2025-01-14 NOTE — Progress Notes (Unsigned)
 "  NEUROLOGY FOLLOW UP OFFICE NOTE  Yvonne Barron [id number]  Subjective:  Yvonne Barron is a 79 y.o. year old right-handed female with a medical history of pre-diabetes, OA s/p left knee replacement, vit D deficiency, breast cancer (dx about 13 years ago, had surgery, no chemo or radiation, but tomoxifen), and HTN who we last saw on 01/23/24 for neuropathy.  To briefly review: 07/24/22: Patient first noticed numbness and tingling in her toes ~2021. At first it just felt like her feet were dry. Lotion did not help. At night her toes would tingle. It does not affect her as much when she is up and walking. Sometimes she feels twitching in her legs that she can move her legs around and it will help. Lately, she feels an occasional crawling sensation on her legs. She can feel tightness under her toes and like she is walking on bubbles. She denies weakness of her legs. She walks often and does not use assistive devices. She denies falls.   She denies significant neck or back pain. She did have some pain in her back for which she went to ED (09/2021). She was diagnosed with left low back pain and sciatica. She was prescribed steroids and muscle relaxers which she only took one or two. She takes tumeric and bromelain, which helps. This pain does not radiate to her feet though.She denies bowel or bladder symptoms.    Her PCP recommended podiatry referral. Patient sees Dr Burt in podiatry. Gabapentin was offered, but patient declined until her neurology appointment.   Of note, patient mentions that as a child, she would walk barefoot and her toes would pop a lot.   EtOH use: Occasional wine with dinner  Restrictive diet? No Family history of neuropathy/myopathy/NM disease? No     Prior medications that have been tried: Nerive supplement (did not help); also previously took B12, but has stopped. She has also tried nerve stimulation that helped, but she could not afford to continue  that.   01/24/23: B12, SPEP, and IFE were unremarkable. Patient began taking alpha lipoic acid, which she continues to take. She still has numbness and tingling in her feet, it may be a little less annoying than previous. She feels like her feet are knotting up or swollen at night. Lidocaine  cream did not help much.   PCP ordered MRI lumbar spine due to sciatica symptoms mentioned at 11/26/22 visit. Insurance denied this. Patient describes a muscle tightness that grabs her. She has to shake her hip area to help it feel better. It does not radiate further into her leg. It used to happen on the right, but now is only occurring on the left. It is a very severe pain. She has not had this pain in at least a month though. She sees a land who is helping her with this.  01/23/24: Overall, patient is doing well. She thinks her neuropathy is maybe a little more painful than prior. She takes something from the health food store with alpha lipoic acid in it. She has an company secretary from [employer] that she feels helps.   She is still not interested in medications for neuropathy.   Her back pain and sciatica has greatly improved.  Most recent Assessment and Plan (01/23/24): This is Yvonne Barron, a 79 y.o. female withdistal symmetric polyneuropathy, mild clinically. Her only known risk factor is pre-diabetes. Overall, her examination and symptoms are stable.    Plan: -Discussed neuropathic pain medications, patient deferred -  Alpha lipoic acid 600 mg once or twice daily -Lidocaine  cream PRN  Since their last visit: ***  MEDICATIONS:  Outpatient Encounter Medications as of 01/26/2025  Medication Sig   amLODipine  (NORVASC ) 2.5 MG tablet Take 1 tablet by mouth daily.   benzonatate  (TESSALON ) 100 MG capsule Take 1 capsule (100 mg total) by mouth 3 (three) times daily as needed for cough.   Blood Glucose Monitoring Suppl (ACCU-CHEK GUIDE) w/Device KIT Use as directed to check  blood sugars.   cholecalciferol  (VITAMIN D ) 1000 units tablet Take 1,000 Units by mouth 2 (two) times daily.   CITRUS BERGAMOT PO Take 1 capsule by mouth daily at 2 am.   glucose blood (ACCU-CHEK GUIDE TEST) test strip Use as instructed   glucose blood (ONETOUCH VERIO) test strip Check blood sugar daily   hydrochlorothiazide  (HYDRODIURIL ) 12.5 MG tablet Take 1 tablet by mouth once daily   HYDROcodone -acetaminophen  (NORCO) 10-325 MG tablet TAKE 1/2 (ONE-HALF) TABLET BY MOUTH EVERY 6 HOURS AS NEEDED FOR PAIN   Lancets (ONETOUCH DELICA PLUS LANCET33G) MISC USE TO TEST BLOOD SUGAR DAILY   predniSONE  (DELTASONE ) 50 MG tablet Take 1 tab po daily for 5 days   UNABLE TO FIND Med Name: vitamins for eyes   UNABLE TO FIND Med Name: healthy feet and nerves   vitamin C (ASCORBIC ACID ) 500 MG tablet Take 500 mg by mouth daily.   No facility-administered encounter medications on file as of 01/26/2025.    PAST MEDICAL HISTORY: Past Medical History:  Diagnosis Date   Arthritis    knee Lt   Cancer (HCC)    2013   History of breast cancer 2009--  INVASIVE DUCTAL IN SITU--  S/P MASTECTOMY   NO RECURRENCE   Hypertension    Pre-diabetes    Urethral caruncle     PAST SURGICAL HISTORY: Past Surgical History:  Procedure Laterality Date   BREAST SURGERY  2013   CYSTOSCOPY WITH URETHRAL CARUNCLE  07/10/2012   Procedure: CYSTOSCOPY WITH URETHRAL CARUNCLE;  Surgeon: Mark C Ottelin, MD;  Location: [facility];  Service: Urology;  Laterality: N/A;    EXCISION OF CARUNCLE    JOINT REPLACEMENT  L/knee   LEFT TOTAL MASTECTOMY/ LEFT AXILLARY SENTINEL LYMPH NODE BX  03/01/2008   INVASIVE  DUCTAL CARCINOMA IN SITU   LEFT WRIST SURG  2008   REPAIR NERVE   MASTECTOMY Left 2009   TOE SURGERY Right 2017   Great toe   TOTAL KNEE ARTHROPLASTY Left 07/28/2020   Procedure: TOTAL KNEE ARTHROPLASTY;  Surgeon: Kay Kemps, MD;  Location: [facility];  Service: Orthopedics;  Laterality: Left;   VAGINAL  HYSTERECTOMY  1998   W/ BILATERAL SALPINGOOPHECTOMY    ALLERGIES: Allergies[1]  FAMILY HISTORY: Family History  Problem Relation Age of Onset   Diabetes Mother    Diabetes Father    Kidney disease Father    Diabetes Brother     SOCIAL HISTORY: Social History[2] Social History   Social History Narrative   Right handed   Lives with son one story home    Caffeine none   Retired but works part time      Objective:  Vital Signs:  LMP  (LMP Unknown)   ***  Labs and Imaging review: New results: 08/10/24: HbA1c: 6.4 Lipid panel: tChol 227, LDL 138, TG 60 CMP unremarkable CBC unremarkable  Previously reviewed results: 10/31/23: HbA1c: 6.3 Lipid panel: tChol 200, LDL 108, TG 37 BMP unremarkable   07/2022: B12: 712 IFE and SPEP:  no M protein HbA1c: 6.1   11/26/22: HbA1c: 6.2   Lumbar spine xray (10/13/22): FINDINGS: Five non rib-bearing lumbar type vertebra are identified in normal alignment.   There is no evidence of acute fracture or subluxation.   Mild degenerative disc disease at L4-5 and L5-S1 noted.   No other significant abnormalities noted.   IMPRESSION: 1. No acute abnormality. 2. Mild degenerative disc disease at L4-5 and L5-S1.  Assessment/Plan:  This is Yvonne Barron, a 79 y.o. female with: ***   Plan: ***  Return to clinic in ***  Total time spent reviewing records, interview, history/exam, documentation, and coordination of care on day of encounter:  *** min  Venetia Potters, MD    [1] No Known Allergies [2]  Social History Tobacco Use   Smoking status: Former    Current packs/day: 0.00    Average packs/day: 2.0 packs/day for 20.0 years (40.0 ttl pk-yrs)    Types: Cigarettes    Start date: 01/19/1967    Quit date: 01/19/1987    Years since quitting: 38.0    Passive exposure: Past   Smokeless tobacco: Never  Vaping Use   Vaping status: Never Used  Substance Use Topics   Alcohol  use: No   Drug use: Never   "

## 2025-01-26 ENCOUNTER — Ambulatory Visit: Payer: Medicare HMO | Admitting: Neurology

## 2025-01-27 ENCOUNTER — Ambulatory Visit: Admitting: Nurse Practitioner

## 2025-05-09 ENCOUNTER — Encounter: Payer: Self-pay | Admitting: Nurse Practitioner

## 2025-05-16 ENCOUNTER — Encounter: Admitting: Nurse Practitioner

## 2025-05-25 ENCOUNTER — Ambulatory Visit: Payer: Self-pay
